# Patient Record
Sex: Female | Born: 1935
Health system: Southern US, Community
[De-identification: ages and names within clinical notes are randomized; demographics above are authoritative.]

## PROBLEM LIST (undated history)

## (undated) DIAGNOSIS — K219 Gastro-esophageal reflux disease without esophagitis: Secondary | ICD-10-CM

## (undated) DIAGNOSIS — T7840XA Allergy, unspecified, initial encounter: Secondary | ICD-10-CM

## (undated) DIAGNOSIS — G43909 Migraine, unspecified, not intractable, without status migrainosus: Secondary | ICD-10-CM

## (undated) DIAGNOSIS — F419 Anxiety disorder, unspecified: Secondary | ICD-10-CM

## (undated) DIAGNOSIS — C801 Malignant (primary) neoplasm, unspecified: Secondary | ICD-10-CM

## (undated) DIAGNOSIS — F329 Major depressive disorder, single episode, unspecified: Secondary | ICD-10-CM

## (undated) DIAGNOSIS — I10 Essential (primary) hypertension: Secondary | ICD-10-CM

## (undated) DIAGNOSIS — F32A Depression, unspecified: Secondary | ICD-10-CM

## (undated) DIAGNOSIS — G629 Polyneuropathy, unspecified: Secondary | ICD-10-CM

## (undated) DIAGNOSIS — M1990 Unspecified osteoarthritis, unspecified site: Secondary | ICD-10-CM

## (undated) HISTORY — DX: Major depressive disorder, single episode, unspecified: F32.9

## (undated) HISTORY — DX: Malignant (primary) neoplasm, unspecified: C80.1

## (undated) HISTORY — PX: BACK SURGERY: SHX140

## (undated) HISTORY — DX: Allergy, unspecified, initial encounter: T78.40XA

## (undated) HISTORY — PX: NECK SURGERY: SHX720

## (undated) HISTORY — DX: Unspecified osteoarthritis, unspecified site: M19.90

## (undated) HISTORY — DX: Migraine, unspecified, not intractable, without status migrainosus: G43.909

## (undated) HISTORY — PX: WRIST SURGERY: SHX841

## (undated) HISTORY — DX: Depression, unspecified: F32.A

## (undated) HISTORY — DX: Essential (primary) hypertension: I10

## (undated) HISTORY — PX: TONSILLECTOMY: SUR1361

## (undated) HISTORY — DX: Anxiety disorder, unspecified: F41.9

## (undated) HISTORY — DX: Gastro-esophageal reflux disease without esophagitis: K21.9

## (undated) HISTORY — PX: MOHS SURGERY: SHX181

---

## 2006-12-10 ENCOUNTER — Inpatient Hospital Stay: Payer: Self-pay | Admitting: *Deleted

## 2006-12-10 ENCOUNTER — Other Ambulatory Visit: Payer: Self-pay

## 2006-12-16 ENCOUNTER — Ambulatory Visit: Payer: Self-pay | Admitting: Unknown Physician Specialty

## 2007-01-09 ENCOUNTER — Ambulatory Visit: Payer: Self-pay | Admitting: Unknown Physician Specialty

## 2007-02-07 ENCOUNTER — Ambulatory Visit: Payer: Self-pay | Admitting: Unknown Physician Specialty

## 2007-03-10 ENCOUNTER — Ambulatory Visit: Payer: Self-pay | Admitting: Unknown Physician Specialty

## 2010-03-12 ENCOUNTER — Emergency Department: Payer: Self-pay | Admitting: Emergency Medicine

## 2012-06-02 ENCOUNTER — Ambulatory Visit: Payer: Self-pay

## 2016-03-05 DIAGNOSIS — G4700 Insomnia, unspecified: Secondary | ICD-10-CM | POA: Diagnosis not present

## 2016-03-05 DIAGNOSIS — R0982 Postnasal drip: Secondary | ICD-10-CM | POA: Diagnosis not present

## 2016-03-05 DIAGNOSIS — J069 Acute upper respiratory infection, unspecified: Secondary | ICD-10-CM | POA: Diagnosis not present

## 2016-03-05 DIAGNOSIS — I10 Essential (primary) hypertension: Secondary | ICD-10-CM | POA: Diagnosis not present

## 2016-06-06 DIAGNOSIS — G2581 Restless legs syndrome: Secondary | ICD-10-CM | POA: Diagnosis not present

## 2016-06-06 DIAGNOSIS — G4700 Insomnia, unspecified: Secondary | ICD-10-CM | POA: Diagnosis not present

## 2016-06-06 DIAGNOSIS — I10 Essential (primary) hypertension: Secondary | ICD-10-CM | POA: Diagnosis not present

## 2016-06-06 DIAGNOSIS — I83893 Varicose veins of bilateral lower extremities with other complications: Secondary | ICD-10-CM | POA: Diagnosis not present

## 2016-06-06 DIAGNOSIS — M5440 Lumbago with sciatica, unspecified side: Secondary | ICD-10-CM | POA: Diagnosis not present

## 2016-08-26 DIAGNOSIS — G2581 Restless legs syndrome: Secondary | ICD-10-CM | POA: Diagnosis not present

## 2016-08-26 DIAGNOSIS — I83893 Varicose veins of bilateral lower extremities with other complications: Secondary | ICD-10-CM | POA: Diagnosis not present

## 2016-08-30 ENCOUNTER — Ambulatory Visit (INDEPENDENT_AMBULATORY_CARE_PROVIDER_SITE_OTHER): Payer: PPO | Admitting: Podiatry

## 2016-08-30 ENCOUNTER — Ambulatory Visit: Payer: PPO | Admitting: Podiatry

## 2016-08-30 ENCOUNTER — Encounter: Payer: Self-pay | Admitting: Podiatry

## 2016-08-30 VITALS — BP 147/76 | HR 59 | Resp 16

## 2016-08-30 DIAGNOSIS — B351 Tinea unguium: Secondary | ICD-10-CM | POA: Diagnosis not present

## 2016-08-30 DIAGNOSIS — E0843 Diabetes mellitus due to underlying condition with diabetic autonomic (poly)neuropathy: Secondary | ICD-10-CM | POA: Diagnosis not present

## 2016-08-30 DIAGNOSIS — L603 Nail dystrophy: Secondary | ICD-10-CM

## 2016-08-30 DIAGNOSIS — L608 Other nail disorders: Secondary | ICD-10-CM

## 2016-08-30 DIAGNOSIS — M79676 Pain in unspecified toe(s): Secondary | ICD-10-CM | POA: Diagnosis not present

## 2016-08-30 DIAGNOSIS — L609 Nail disorder, unspecified: Secondary | ICD-10-CM

## 2016-08-30 DIAGNOSIS — M79609 Pain in unspecified limb: Principal | ICD-10-CM

## 2016-08-30 NOTE — Progress Notes (Signed)
   Subjective:    Patient ID: Sandra Davidson, female    DOB: [birth year], 80 y.o.   MRN: [id number]  HPI    Review of Systems  Musculoskeletal: Positive for back pain.  Neurological: Positive for headaches.  Hematological: Bruises/bleeds easily.       Objective:   Physical Exam        Assessment & Plan:

## 2016-09-01 NOTE — Progress Notes (Signed)
Patient ID: Sandra Davidson, female   DOB: Jun 12, 1936, 80 y.o.   MRN: [id number] SUBJECTIVE Patient with a history of diabetes mellitus presents to office today complaining of elongated, thickened nails. Pain while ambulating in shoes. Patient is unable to trim their own nails.   Allergies  Allergen Reactions  . Codeine   . Demerol [Meperidine]   . Penicillins   . Sulfa Antibiotics     OBJECTIVE General Patient is awake, alert, and oriented x 3 and in no acute distress. Derm Skin is dry and supple bilateral. Negative open lesions or macerations. Remaining integument unremarkable. Nails are tender, long, thickened and dystrophic with subungual debris, consistent with onychomycosis, 1-5 bilateral. No signs of infection noted. Vasc  DP and PT pedal pulses palpable bilaterally. Temperature gradient within normal limits.  Neuro Epicritic and protective threshold sensation diminished bilaterally.  Musculoskeletal Exam No symptomatic pedal deformities noted bilateral. Muscular strength within normal limits.  ASSESSMENT 1. Diabetes Mellitus w/ peripheral neuropathy 2. Onychomycosis of nail due to dermatophyte bilateral 3. Pain in foot bilateral  PLAN OF CARE Patient evaluated today. Instructed to maintain good pedal hygiene and foot care. Stressed importance of controlling blood sugar.  Mechanical debridement of nails 1-5 bilaterally performed using a nail nipper. Filed with dremel without incident.  All patient questions were answered. Return to clinic in 3 mos.    Edrick Kins, DPM

## 2016-09-01 NOTE — Patient Instructions (Signed)
Diabetes and Foot Care Diabetes may cause you to have problems because of poor blood supply (circulation) to your feet and legs. This may cause the skin on your feet to become thinner, break easier, and heal more slowly. Your skin may become dry, and the skin may peel and crack. You may also have nerve damage in your legs and feet causing decreased feeling in them. You may not notice minor injuries to your feet that could lead to infections or more serious problems. Taking care of your feet is one of the most important things you can do for yourself.  HOME CARE INSTRUCTIONS  Wear shoes at all times, even in the house. Do not go barefoot. Bare feet are easily injured.  Check your feet daily for blisters, cuts, and redness. If you cannot see the bottom of your feet, use a mirror or ask someone for help.  Wash your feet with warm water (do not use hot water) and mild soap. Then pat your feet and the areas between your toes until they are completely dry. Do not soak your feet as this can dry your skin.  Apply a moisturizing lotion or petroleum jelly (that does not contain alcohol and is unscented) to the skin on your feet and to dry, brittle toenails. Do not apply lotion between your toes.  Trim your toenails straight across. Do not dig under them or around the cuticle. File the edges of your nails with an emery board or nail file.  Do not cut corns or calluses or try to remove them with medicine.  Wear clean socks or stockings every day. Make sure they are not too tight. Do not wear knee-high stockings since they may decrease blood flow to your legs.  Wear shoes that fit properly and have enough cushioning. To break in new shoes, wear them for just a few hours a day. This prevents you from injuring your feet. Always look in your shoes before you put them on to be sure there are no objects inside.  Do not cross your legs. This may decrease the blood flow to your feet.  If you find a minor scrape,  cut, or break in the skin on your feet, keep it and the skin around it clean and dry. These areas may be cleansed with mild soap and water. Do not cleanse the area with peroxide, alcohol, or iodine.  When you remove an adhesive bandage, be sure not to damage the skin around it.  If you have a wound, look at it several times a day to make sure it is healing.  Do not use heating pads or hot water bottles. They may burn your skin. If you have lost feeling in your feet or legs, you may not know it is happening until it is too late.  Make sure your health care provider performs a complete foot exam at least annually or more often if you have foot problems. Report any cuts, sores, or bruises to your health care provider immediately. SEEK MEDICAL CARE IF:   You have an injury that is not healing.  You have cuts or breaks in the skin.  You have an ingrown nail.  You notice redness on your legs or feet.  You feel burning or tingling in your legs or feet.  You have pain or cramps in your legs and feet.  Your legs or feet are numb.  Your feet always feel cold. SEEK IMMEDIATE MEDICAL CARE IF:   There is increasing redness,   swelling, or pain in or around a wound.  There is a red line that goes up your leg.  Pus is coming from a wound.  You develop a fever or as directed by your health care provider.  You notice a bad smell coming from an ulcer or wound.   This information is not intended to replace advice given to you by your health care provider. Make sure you discuss any questions you have with your health care provider.   Document Released: 11/22/2000 Document Revised: 07/28/2013 Document Reviewed: 05/04/2013 Elsevier Interactive Patient Education 2016 [employer]  

## 2016-09-04 DIAGNOSIS — I83893 Varicose veins of bilateral lower extremities with other complications: Secondary | ICD-10-CM | POA: Diagnosis not present

## 2016-09-09 ENCOUNTER — Encounter (INDEPENDENT_AMBULATORY_CARE_PROVIDER_SITE_OTHER): Payer: PPO | Admitting: Vascular Surgery

## 2016-09-26 ENCOUNTER — Encounter (INDEPENDENT_AMBULATORY_CARE_PROVIDER_SITE_OTHER): Payer: Self-pay | Admitting: Vascular Surgery

## 2016-09-26 ENCOUNTER — Ambulatory Visit (INDEPENDENT_AMBULATORY_CARE_PROVIDER_SITE_OTHER): Payer: PPO | Admitting: Vascular Surgery

## 2016-09-26 DIAGNOSIS — G629 Polyneuropathy, unspecified: Secondary | ICD-10-CM | POA: Diagnosis not present

## 2016-09-26 DIAGNOSIS — M5442 Lumbago with sciatica, left side: Secondary | ICD-10-CM | POA: Diagnosis not present

## 2016-09-26 DIAGNOSIS — I872 Venous insufficiency (chronic) (peripheral): Secondary | ICD-10-CM | POA: Diagnosis not present

## 2016-09-26 DIAGNOSIS — I890 Lymphedema, not elsewhere classified: Secondary | ICD-10-CM

## 2016-09-26 DIAGNOSIS — M545 Low back pain, unspecified: Secondary | ICD-10-CM | POA: Insufficient documentation

## 2016-09-26 DIAGNOSIS — G8929 Other chronic pain: Secondary | ICD-10-CM

## 2016-09-26 DIAGNOSIS — M5441 Lumbago with sciatica, right side: Secondary | ICD-10-CM

## 2016-09-28 NOTE — Progress Notes (Signed)
MRN : [id number]  Sandra Davidson is a 80 y.o. (09-30-1936) female who presents with chief complaint of  Chief Complaint  Patient presents with  . New Patient (Initial Visit)  .  History of Present Illness: Patient is seen for evaluation of leg pain and leg swelling. The patient first noticed the swelling remotely. The swelling is associated with pain and discoloration. The pain and swelling worsens with prolonged dependency and improves with elevation. The pain is unrelated to activity.  The patient notes that in the morning the legs are significantly improved but they steadily worsened throughout the course of the day. The patient also notes a steady worsening of the discoloration in the ankle and shin area.   The patient denies claudication symptoms.  The patient denies symptoms consistent with rest pain.  The patient denies and extensive history of DJD and LS spine disease.  The patient has no had any past angiography, interventions or vascular surgery.  Elevation makes the leg symptoms better, dependency makes them much worse. There is no history of ulcerations. The patient denies any recent changes in medications.  The patient has not been wearing graduated compression.  The patient denies a history of DVT or PE. There is no prior history of phlebitis. There is no history of primary lymphedema.  No history of malignancies. No history of trauma or groin or pelvic surgery. There is no history of radiation treatment to the groin or pelvis  The patient denies amaurosis fugax or recent TIA symptoms. There are no recent neurological changes noted. The patient denies recent episodes of angina or shortness of breath.   Current Outpatient Prescriptions  Medication Sig Dispense Refill  . ALPRAZolam (XANAX) 0.5 MG tablet Take 0.5 mg by mouth at bedtime as needed for anxiety.    Marland Kitchen amLODipine-benazepril (LOTREL) 10-20 MG capsule Take 1 capsule by mouth daily.    . furosemide (LASIX) 20 MG  tablet Take 20 mg by mouth.    . gabapentin (NEURONTIN) 100 MG capsule Take 100 mg by mouth at bedtime.    Marland Kitchen ibuprofen (ADVIL,MOTRIN) 600 MG tablet Take 600 mg by mouth every 6 (six) hours as needed.    . potassium chloride (K-DUR,KLOR-CON) 10 MEQ tablet Take 10 mEq by mouth once.     No current facility-administered medications for this visit.     Past Medical History:  Diagnosis Date  . Hypertension     Past Surgical History:  Procedure Laterality Date  . BACK SURGERY    . MOHS SURGERY    . NECK SURGERY      Social History Social History  Substance Use Topics  . Smoking status: Former Research scientist (life sciences)  . Smokeless tobacco: Never Used  . Alcohol use No  No family history of bleeding/clotting disorders, porphyria or autoimmune disease   Family History Family History  Problem Relation Age of Onset  . Congestive Heart Failure Mother   . COPD Mother   . Emphysema Mother   . Hypertension Mother   . Kidney disease Mother   . Cancer Father   . Kidney disease Father     Allergies  Allergen Reactions  . Codeine   . Demerol [Meperidine]   . Penicillins   . Sulfa Antibiotics      REVIEW OF SYSTEMS (Negative unless checked)  Constitutional: [] Weight loss  [] Fever  [] Chills Cardiac: [] Chest pain   [] Chest pressure   [] Palpitations   [] Shortness of breath at rest   [] Shortness of breath with exertion. Vascular:  []   Pain in legs with walking   [] Pain in legs at rest    [] Pain in feet at rest    [] History of DVT   [] Phlebitis   [x] Swelling in legs   [] Varicose veins   [] Non-healing ulcers Pulmonary:   [] Uses home oxygen   [] Productive cough   [] Hemoptysis   [] Wheeze  [] COPD   [] Asthma Neurologic:  [] Dizziness  [] Blackouts   [] Seizures   [] History of stroke   [] History of TIA  [] Aphasia   [] Temporary blindness   [] Dysphagia   [] Weakness or numbness in arm   [] Weakness or numbness in leg Musculoskeletal:  [x] Arthritis   [x] Joint swelling   [x] Joint pain   [] Low back pain Hematologic:   [] Easy bruising  [] Easy bleeding   [] Hypercoagulable state   [] Anemic   Gastrointestinal:  [] Blood in stool   [] Vomiting blood  [] Gastroesophageal reflux/heartburn   [] Difficulty swallowing. Genitourinary:  [] Chronic kidney disease   [] Difficult urination  [] Frequent urination  [] Burning with urination   [] Blood in urine Skin:  [] Rashes   [] Ulcers   Psychological:  [] History of anxiety   []  History of major depression.    Physical Examination  Vitals:   09/26/16 1459  BP: 120/68  Pulse: 74  Resp: 16  Weight: 145 lb (65.8 kg)  Height: 5\' 7"  (1.702 m)   Body mass index is 22.71 kg/m. Gen:  WD/WN, NAD Head: [place]/AT, No temporalis wasting. Ear/Nose/Throat: Hearing grossly intact, nares w/o erythema or drainage, trachea midline Eyes: PERR, EOM appear normal. Sclera non-icteric Neck: Supple, no nuchal rigidity.  No bruit or JVD.  Pulmonary:  Good air movement, equal and clear to auscultation bilaterally.  Cardiac: RRR, normal S1, S2, no Murmurs, rubs or gallops. Vascular:   Moderate venous changes bilateral ankles  2-3+ pitting edema Rt=Lt Vessel Right Left  Radial Palpable Palpable  Ulnar Palpable Palpable  Brachial Palpable Palpable  Carotid Palpable Palpable  Aorta Not palpable N/A  Femoral Palpable Palpable  Popliteal Palpable Palpable  PT Palpable Palpable  DP Palpable Palpable   Gastrointestinal: soft, non-rigid/non-distended. No guarding.  Musculoskeletal: M/S 5/5 throughout.  No deformity or atrophy. Neurologic: CN 2-12 intact. Pain and light touch intact in extremities.  Speech is fluent. Motor exam as listed above. Psychiatric: Judgment intact, Mood & affect appropriate for pt's clinical situation. Dermatologic: No rashes or ulcers noted.  No signs consistent with cellulitis. Lymphatic:  No cervical lymphadenopathy  CBC No results found for: WBC, HGB, HCT, MCV, PLT  BMET No results found for: NA, K, CL, CO2, GLUCOSE, BUN, CREATININE, CALCIUM, GFRNONAA, GFRAA CrCl  cannot be calculated (No order found.).  COAG No results found for: INR, PROTIME  Radiology No results found.    Assessment/Plan 1. Chronic venous insufficiency I have had a long discussion with the patient regarding swelling and why it  causes symptoms.  Patient will begin wearing graduated compression stockings class 1 (20-30 mmHg) on a daily basis a prescription was given. The patient will  beginning wearing the stockings first thing in the morning and removing them in the evening. The patient is instructed specifically not to sleep in the stockings.   In addition, behavioral modification will be initiated.  This will include frequent elevation, use of over the counter pain medications and exercise such as walking.  I have reviewed systemic causes for chronic edema such as liver, kidney and cardiac etiologies.  The patient denies problems with these organ systems.    Consideration for a lymph pump will also be made based  upon the effectiveness of conservative therapy.  This would help to improve the edema control and prevent sequela such as ulcers and infections   Patient's duplex ultrasound dated 09/04/2016 did not show any DVT and  reflux is not present.  The patient will follow-up with to get a lymph pump  2. Lymphedema Will move forward with a lymph pump  3. Chronic bilateral low back pain with bilateral sciatica Continue antiinflammatory medications   4. Neuropathy ([place]) Continue  gabapentin    Hortencia Pilar, MD  09/28/2016 1:19 PM    This note was created with Dragon medical transcription system.  Any errors from dictation are purely unintentional

## 2016-10-03 DIAGNOSIS — G2581 Restless legs syndrome: Secondary | ICD-10-CM | POA: Diagnosis not present

## 2016-10-03 DIAGNOSIS — N390 Urinary tract infection, site not specified: Secondary | ICD-10-CM | POA: Diagnosis not present

## 2016-10-03 DIAGNOSIS — I10 Essential (primary) hypertension: Secondary | ICD-10-CM | POA: Diagnosis not present

## 2016-10-03 DIAGNOSIS — G4700 Insomnia, unspecified: Secondary | ICD-10-CM | POA: Diagnosis not present

## 2016-10-03 DIAGNOSIS — E559 Vitamin D deficiency, unspecified: Secondary | ICD-10-CM | POA: Diagnosis not present

## 2016-10-03 DIAGNOSIS — Z23 Encounter for immunization: Secondary | ICD-10-CM | POA: Diagnosis not present

## 2016-10-03 DIAGNOSIS — I83893 Varicose veins of bilateral lower extremities with other complications: Secondary | ICD-10-CM | POA: Diagnosis not present

## 2016-10-03 DIAGNOSIS — Z0001 Encounter for general adult medical examination with abnormal findings: Secondary | ICD-10-CM | POA: Diagnosis not present

## 2016-10-16 ENCOUNTER — Telehealth (INDEPENDENT_AMBULATORY_CARE_PROVIDER_SITE_OTHER): Payer: Self-pay

## 2016-10-16 NOTE — Telephone Encounter (Signed)
Patient called stating she was in a lot of pain from her back, she know that Dr. Delana Meyer see her for her vascular issues but was wondering, since she had a prescription from a former doctor for Lodine could he call it in. She has taken the last pills wondered would Dr. Delana Meyer write her a prescription for more. She has called the pharmacy and they sent the new prescription over to [facility] but he suggested she call them, the prescription was written by a Dr. Hardin Negus who is deceased now so she called them but is unable to get through. So she decided to call Dr. Delana Meyer so she could just have it called in and pick it up. I explained to the patient that we have not seen her for problems with her back and did not write that prescription so she would have to go through her PCP.

## 2016-11-25 ENCOUNTER — Ambulatory Visit (INDEPENDENT_AMBULATORY_CARE_PROVIDER_SITE_OTHER): Payer: PPO | Admitting: Vascular Surgery

## 2016-11-25 ENCOUNTER — Encounter (INDEPENDENT_AMBULATORY_CARE_PROVIDER_SITE_OTHER): Payer: Self-pay | Admitting: Vascular Surgery

## 2016-11-25 VITALS — BP 135/70 | HR 62 | Resp 16 | Ht 67.0 in | Wt 143.0 lb

## 2016-11-25 DIAGNOSIS — G629 Polyneuropathy, unspecified: Secondary | ICD-10-CM | POA: Diagnosis not present

## 2016-11-25 DIAGNOSIS — I890 Lymphedema, not elsewhere classified: Secondary | ICD-10-CM

## 2016-11-25 DIAGNOSIS — I872 Venous insufficiency (chronic) (peripheral): Secondary | ICD-10-CM

## 2016-11-25 NOTE — Progress Notes (Signed)
MRN : [id number]  Sandra Davidson is a 80 y.o. (1936/06/18) female who presents with chief complaint of  Chief Complaint  Patient presents with  . Re-evaluation    2 month follow up  .  History of Present Illness:The patient returns to the office for followup evaluation regarding leg swelling.  The swelling has persisted and the pain associated with swelling continues. There have not been any interval development of a ulcerations or wounds.  Since the previous visit the patient has been wearing graduated compression stockings and has noted little if any improvement in the lymphedema. The patient has been using compression routinely morning until night.  The patient also states elevation during the day and exercise is being done too.   Current Meds  Medication Sig  . ALPRAZolam (XANAX) 0.5 MG tablet Take 0.5 mg by mouth at bedtime as needed for anxiety.  Marland Kitchen amLODipine-benazepril (LOTREL) 10-20 MG capsule Take 1 capsule by mouth daily.  Marland Kitchen etodolac (LODINE) 300 MG capsule Take 300 mg by mouth every 8 (eight) hours.  . furosemide (LASIX) 20 MG tablet Take 20 mg by mouth.  Marland Kitchen ibuprofen (ADVIL,MOTRIN) 600 MG tablet Take 600 mg by mouth every 6 (six) hours as needed.  . potassium chloride (K-DUR,KLOR-CON) 10 MEQ tablet Take 10 mEq by mouth once.    Past Medical History:  Diagnosis Date  . Hypertension     Past Surgical History:  Procedure Laterality Date  . BACK SURGERY    . MOHS SURGERY    . NECK SURGERY      Social History Social History  Substance Use Topics  . Smoking status: Former Research scientist (life sciences)  . Smokeless tobacco: Never Used  . Alcohol use No    Family History Family History  Problem Relation Age of Onset  . Congestive Heart Failure Mother   . COPD Mother   . Emphysema Mother   . Hypertension Mother   . Kidney disease Mother   . Cancer Father   . Kidney disease Father   No family history of bleeding/clotting disorders, porphyria or autoimmune disease   Allergies    Allergen Reactions  . Codeine   . Demerol [Meperidine]   . Penicillins   . Sulfa Antibiotics      REVIEW OF SYSTEMS (Negative unless checked)  Constitutional: [] Weight loss  [] Fever  [] Chills Cardiac: [] Chest pain   [] Chest pressure   [] Palpitations   [] Shortness of breath when laying flat   [] Shortness of breath with exertion. Vascular:  [] Pain in legs with walking   [x] Pain in legs at rest  [] History of DVT   [] Phlebitis   [x] Swelling in legs   [] Varicose veins   [] Non-healing ulcers Pulmonary:   [] Uses home oxygen   [] Productive cough   [] Hemoptysis   [] Wheeze  [] COPD   [] Asthma Neurologic:  [] Dizziness   [] Seizures   [] History of stroke   [] History of TIA  [] Aphasia   [] Vissual changes   [] Weakness or numbness in arm   [] Weakness or numbness in leg Musculoskeletal:   [] Joint swelling   [] Joint pain   [] Low back pain Hematologic:  [] Easy bruising  [] Easy bleeding   [] Hypercoagulable state   [] Anemic Gastrointestinal:  [] Diarrhea   [] Vomiting  [] Gastroesophageal reflux/heartburn   [] Difficulty swallowing. Genitourinary:  [] Chronic kidney disease   [] Difficult urination  [] Frequent urination   [] Blood in urine Skin:  [] Rashes   [] Ulcers  Psychological:  [] History of anxiety   []  History of major depression.  Physical Examination  Vitals:  11/25/16 1306  BP: 135/70  Pulse: 62  Resp: 16  Weight: 143 lb (64.9 kg)  Height: 5\' 7"  (1.702 m)   Body mass index is 22.4 kg/m. Gen: WD/WN, NAD Head: [place]/AT, No temporalis wasting.  Ear/Nose/Throat: Hearing grossly intact, nares w/o erythema or drainage, poor dentition Eyes: PER, EOMI, sclera nonicteric.  Neck: Supple, no masses.  No bruit or JVD.  Pulmonary:  Good air movement, clear to auscultation bilaterally, no use of accessory muscles.  Cardiac: RRR, normal S1, S2, no Murmurs. Vascular: right leg swelling 3+ left leg swelling 2+ moderate venous stasis changes right > left Vessel Right Left  Radial Palpable Palpable  Ulnar  Palpable Palpable  Brachial Palpable Palpable  Carotid Palpable Palpable  Femoral Palpable Palpable  Popliteal Palpable Palpable  PT Palpable Palpable  DP Palpable Palpable   Gastrointestinal: soft, non-distended. No guarding/no peritoneal signs.  Musculoskeletal: M/S 5/5 throughout.  No deformity or atrophy.  Neurologic: CN 2-12 intact. Pain and light touch intact in extremities.  Symmetrical.  Speech is fluent. Motor exam as listed above. Psychiatric: Judgment intact, Mood & affect appropriate for pt's clinical situation. Dermatologic: No rashes or ulcers noted.  No changes consistent with cellulitis. Lymph : No Cervical lymphadenopathy, no lichenification or skin changes of chronic lymphedema.  CBC No results found for: WBC, HGB, HCT, MCV, PLT  BMET No results found for: NA, K, CL, CO2, GLUCOSE, BUN, CREATININE, CALCIUM, GFRNONAA, GFRAA CrCl cannot be calculated (No order found.).  COAG No results found for: INR, PROTIME  Radiology No results found.  Assessment/Plan 1. Chronic venous insufficiency Recommend:  No surgery or intervention at this point in time.    I have reviewed my previous discussion with the patient regarding swelling and why it causes symptoms.  Patient will continue wearing graduated compression stockings class 1 (20-30 mmHg) on a daily basis. The patient will  beginning wearing the stockings first thing in the morning and removing them in the evening. The patient is instructed specifically not to sleep in the stockings.    In addition, behavioral modification including several periods of elevation of the lower extremities during the day will be continued.  This was reviewed with the patient during the initial visit.  The patient will also continue routine exercise, especially walking on a daily basis as was discussed during the initial visit.    Despite conservative treatments including graduated compression therapy class 1 and behavioral modification  including exercise and elevation the patient  has not obtained adequate control of the lymphedema.  The patient still has stage 3 lymphedema and therefore, I believe that a lymph pump should be added to improve the control of the patient's lymphedema.  Additionally, a lymph pump is warranted because it will reduce the risk of cellulitis and ulceration in the future.  Patient should follow-up in six months    2. Lymphedema See #1  3. Neuropathy ([place]) She has stopped the gabapentin  Because it wasn't helping  She has started back on Lodine which is making her feel better  Hortencia Pilar, MD  11/25/2016 1:11 PM

## 2017-01-14 DIAGNOSIS — I890 Lymphedema, not elsewhere classified: Secondary | ICD-10-CM | POA: Diagnosis not present

## 2017-02-13 ENCOUNTER — Ambulatory Visit (INDEPENDENT_AMBULATORY_CARE_PROVIDER_SITE_OTHER): Payer: PPO | Admitting: Family Medicine

## 2017-02-13 ENCOUNTER — Encounter: Payer: Self-pay | Admitting: Family Medicine

## 2017-02-13 DIAGNOSIS — M1990 Unspecified osteoarthritis, unspecified site: Secondary | ICD-10-CM | POA: Insufficient documentation

## 2017-02-13 DIAGNOSIS — C4490 Unspecified malignant neoplasm of skin, unspecified: Secondary | ICD-10-CM | POA: Diagnosis not present

## 2017-02-13 DIAGNOSIS — M170 Bilateral primary osteoarthritis of knee: Secondary | ICD-10-CM

## 2017-02-13 DIAGNOSIS — I10 Essential (primary) hypertension: Secondary | ICD-10-CM | POA: Diagnosis not present

## 2017-02-13 DIAGNOSIS — I872 Venous insufficiency (chronic) (peripheral): Secondary | ICD-10-CM

## 2017-02-13 DIAGNOSIS — F419 Anxiety disorder, unspecified: Secondary | ICD-10-CM | POA: Diagnosis not present

## 2017-02-13 DIAGNOSIS — F329 Major depressive disorder, single episode, unspecified: Secondary | ICD-10-CM | POA: Insufficient documentation

## 2017-02-13 DIAGNOSIS — F32A Depression, unspecified: Secondary | ICD-10-CM | POA: Insufficient documentation

## 2017-02-13 NOTE — Progress Notes (Signed)
Pre visit review using our clinic review tool, if applicable. No additional management support is needed unless otherwise documented below in the visit note. 

## 2017-02-13 NOTE — Assessment & Plan Note (Signed)
Currently stable. She'll continue to monitor this.

## 2017-02-13 NOTE — Progress Notes (Signed)
Tommi Rumps, MD Phone: [phone]  Sandra Davidson is a 81 y.o. female who presents today for new patient visit.  Hypertension: She does not check at home. She takes amlodipine and benazepril. Reports she had lab work about 3 months ago at her prior physician's office. No chest pain or shortness of breath. She does have chronic venous insufficiency which she follows with vascular surgery for. She is currently using pumps for this. Also takes Lasix 3-4 days a week. She takes potassium with this. This has not worsened.  She follows with dermatology for skin cancer regularly.  Anxiety: Take Xanax nightly. She can tell a difference if she misses a dose. Some depression. Notes this is all helped out by her dog who serves as a calming influence. She has no SI or HI. She notes no drowsiness or falls.  She reports chronic issues with neuropathy and knee pain. Has had this for at least 4 years. Following with vascular surgery for her neuropathy.  Active Ambulatory Problems    Diagnosis Date Noted  . Chronic venous insufficiency 09/26/2016  . Lymphedema 09/26/2016  . Lumbar back pain 09/26/2016  . Neuropathy ([place]) 09/26/2016  . Hypertension 02/13/2017  . Skin cancer 02/13/2017  . Anxiety 02/13/2017  . Osteoarthritis 02/13/2017   Resolved Ambulatory Problems    Diagnosis Date Noted  . No Resolved Ambulatory Problems   Past Medical History:  Diagnosis Date  . Allergy   . Anxiety   . Arthritis   . Cancer ([place])   . Depression   . GERD (gastroesophageal reflux disease)   . Hypertension   . Migraines     Family History  Problem Relation Age of Onset  . Congestive Heart Failure Mother   . COPD Mother   . Emphysema Mother   . Hypertension Mother   . Kidney disease Mother   . Cancer Father   . Kidney disease Father     Social History   Social History  . Marital status: Divorced    Spouse name: N/A  . Number of children: N/A  . Years of education: N/A   Occupational  History  . Not on file.   Social History Main Topics  . Smoking status: Former Research scientist (life sciences)  . Smokeless tobacco: Never Used  . Alcohol use No  . Drug use: No  . Sexual activity: Not on file   Other Topics Concern  . Not on file   Social History Narrative  . No narrative on file    ROS  General:  Negative for nexplained weight loss, fever Skin: Negative for new or changing mole, sore that won't heal HEENT: Positive for trouble seeing (she wears glasses), Negative for trouble hearing, ringing in ears, mouth sores, hoarseness, change in voice, dysphagia. CV:  Positive for edema, Negative for chest pain, dyspnea, palpitations Resp: Negative for cough, dyspnea, hemoptysis GI: Negative for nausea, vomiting, diarrhea, constipation, abdominal pain, melena, hematochezia. GU: Negative for dysuria, incontinence, urinary hesitance, hematuria, vaginal or penile discharge, polyuria, sexual difficulty, lumps in testicle or breasts MSK: Positive for muscle cramps or aches, joint pain or swelling Neuro: Negative for headaches, weakness, numbness, dizziness, passing out/fainting Psych: Positive for anxiety, Negative for depression, memory problems  Objective  Physical Exam Vitals:   02/13/17 0909  BP: 100/68  Pulse: 68  Temp: 97.9 F (36.6 C)    BP Readings from Last 3 Encounters:  02/13/17 100/68  11/25/16 135/70  09/26/16 120/68   Wt Readings from Last 3 Encounters:  02/13/17 144 lb (  65.3 kg)  11/25/16 143 lb (64.9 kg)  09/26/16 145 lb (65.8 kg)    Physical Exam  Constitutional: No distress.  HENT:  Head: Normocephalic and atraumatic.  Mouth/Throat: Oropharynx is clear and moist. No oropharyngeal exudate.  Eyes: Conjunctivae are normal. Pupils are equal, round, and reactive to light.  Cardiovascular: Normal rate, regular rhythm and normal heart sounds.   Pulmonary/Chest: Effort normal and breath sounds normal.  Abdominal: Soft. Bowel sounds are normal. She exhibits no  distension. There is no tenderness. There is no rebound and no guarding.  Musculoskeletal: She exhibits no edema (compression socks place).  Neurological: She is alert. Gait normal.  Skin: Skin is warm and dry. She is not diaphoretic.  Psychiatric: Mood and affect normal.     Assessment/Plan:   Chronic venous insufficiency Stable. Followed by vascular surgery. She'll continue to follow with them.  Hypertension Well-controlled. Continue current medications. We'll request records of her most recent lab work. Follow-up in 3 months.  Skin cancer She'll continue to follow with dermatology.  Anxiety Stable on Xanax. No drowsiness or falls. A letter was provided regarding her pet who serves as a calming and soothing influence for her anxiety and depression for her housing.  Osteoarthritis Currently stable. She'll continue to monitor this.   No orders of the defined types were placed in this encounter.   No orders of the defined types were placed in this encounter.    Tommi Rumps, MD [place]

## 2017-02-13 NOTE — Assessment & Plan Note (Signed)
She'll continue to follow with dermatology.

## 2017-02-13 NOTE — Assessment & Plan Note (Signed)
Stable on Xanax. No drowsiness or falls. A letter was provided regarding her pet who serves as a calming and soothing influence for her anxiety and depression for her housing.

## 2017-02-13 NOTE — Assessment & Plan Note (Signed)
Well-controlled. Continue current medications. We'll request records of her most recent lab work. Follow-up in 3 months.

## 2017-02-13 NOTE — Patient Instructions (Signed)
Nice to meet you. We will request your records from your prior physician. We'll see you back in 3 months to recheck your blood pressure. Please continue to follow with vascular surgery for your venous insufficiency.

## 2017-02-13 NOTE — Assessment & Plan Note (Signed)
Stable. Followed by vascular surgery. She'll continue to follow with them.

## 2017-04-14 DIAGNOSIS — C44119 Basal cell carcinoma of skin of left eyelid, including canthus: Secondary | ICD-10-CM | POA: Diagnosis not present

## 2017-04-14 DIAGNOSIS — L821 Other seborrheic keratosis: Secondary | ICD-10-CM | POA: Diagnosis not present

## 2017-04-14 DIAGNOSIS — D1801 Hemangioma of skin and subcutaneous tissue: Secondary | ICD-10-CM | POA: Diagnosis not present

## 2017-04-14 DIAGNOSIS — C44622 Squamous cell carcinoma of skin of right upper limb, including shoulder: Secondary | ICD-10-CM | POA: Diagnosis not present

## 2017-04-14 DIAGNOSIS — L814 Other melanin hyperpigmentation: Secondary | ICD-10-CM | POA: Diagnosis not present

## 2017-04-14 DIAGNOSIS — D485 Neoplasm of uncertain behavior of skin: Secondary | ICD-10-CM | POA: Diagnosis not present

## 2017-04-22 DIAGNOSIS — D0461 Carcinoma in situ of skin of right upper limb, including shoulder: Secondary | ICD-10-CM | POA: Diagnosis not present

## 2017-04-23 ENCOUNTER — Other Ambulatory Visit: Payer: Self-pay

## 2017-04-23 DIAGNOSIS — I10 Essential (primary) hypertension: Secondary | ICD-10-CM

## 2017-04-23 MED ORDER — ALPRAZOLAM 0.5 MG PO TABS: 0.5000 mg | ORAL_TABLET | Freq: Every evening | ORAL | 1 refills | Status: DC | PRN

## 2017-04-23 NOTE — Telephone Encounter (Signed)
Script faxed to [facility].

## 2017-04-23 NOTE — Telephone Encounter (Signed)
Requesting refill on ibuprofen and alprazolam both filed under historical

## 2017-04-23 NOTE — Telephone Encounter (Signed)
Last OV 02/13/17 filed under historical

## 2017-04-23 NOTE — Telephone Encounter (Signed)
Please fax xanax. Needs renal function labs prior to refilling ibuprofen. Get her set up for a lab appointment. Order placed. Thanks.

## 2017-04-25 ENCOUNTER — Other Ambulatory Visit: Payer: Self-pay | Admitting: *Deleted

## 2017-04-25 NOTE — Telephone Encounter (Signed)
Patient has requested a refill for Ibuprofen  Pharmacy [facility] [place] contact [phone]

## 2017-04-25 NOTE — Telephone Encounter (Signed)
Last OV 02/13/17 filed under historical

## 2017-04-25 NOTE — Telephone Encounter (Signed)
Patient needs a lab appointment prior to filling this. Thanks. Orders have been placed.

## 2017-04-28 ENCOUNTER — Other Ambulatory Visit: Payer: Self-pay | Admitting: Family Medicine

## 2017-04-28 ENCOUNTER — Other Ambulatory Visit (INDEPENDENT_AMBULATORY_CARE_PROVIDER_SITE_OTHER): Payer: PPO

## 2017-04-28 DIAGNOSIS — I10 Essential (primary) hypertension: Secondary | ICD-10-CM | POA: Diagnosis not present

## 2017-04-28 LAB — BASIC METABOLIC PANEL
BUN: 13 mg/dL (ref 6–23)
CHLORIDE: 106 meq/L (ref 96–112)
CO2: 24 meq/L (ref 19–32)
Calcium: 9.5 mg/dL (ref 8.4–10.5)
Creatinine, Ser: 0.87 mg/dL (ref 0.40–1.20)
GFR: 66.8 mL/min (ref >60.00)
Glucose, Bld: 121 mg/dL — ABNORMAL HIGH (ref 70–99)
Potassium: 3.5 mEq/L (ref 3.5–5.1)
Sodium: 141 mEq/L (ref 135–145)

## 2017-04-28 NOTE — Telephone Encounter (Signed)
Advil 600 mg rx request Last OV 02/13/2017 Next OV 05/16/2017 Please advise

## 2017-04-29 ENCOUNTER — Other Ambulatory Visit: Payer: Self-pay | Admitting: Family Medicine

## 2017-04-29 DIAGNOSIS — R7309 Other abnormal glucose: Secondary | ICD-10-CM

## 2017-04-30 NOTE — Telephone Encounter (Signed)
Patient has appmt on 05/16/17

## 2017-04-30 NOTE — Telephone Encounter (Signed)
Patient had labs drawn already. Order has already been sent to pharmacy.

## 2017-05-01 ENCOUNTER — Other Ambulatory Visit (INDEPENDENT_AMBULATORY_CARE_PROVIDER_SITE_OTHER): Payer: PPO

## 2017-05-01 DIAGNOSIS — R7309 Other abnormal glucose: Secondary | ICD-10-CM | POA: Diagnosis not present

## 2017-05-01 LAB — HEMOGLOBIN A1C: HEMOGLOBIN A1C: 6.2 % (ref 4.6–6.5)

## 2017-05-02 ENCOUNTER — Telehealth: Payer: Self-pay | Admitting: Family Medicine

## 2017-05-02 NOTE — Telephone Encounter (Signed)
Patient notified of lab results

## 2017-05-02 NOTE — Telephone Encounter (Signed)
Pt called and is requesting lab results. Please advise, thank you!  Call pt @ [phone]

## 2017-05-16 ENCOUNTER — Encounter: Payer: Self-pay | Admitting: Family Medicine

## 2017-05-16 ENCOUNTER — Ambulatory Visit (INDEPENDENT_AMBULATORY_CARE_PROVIDER_SITE_OTHER): Payer: PPO | Admitting: Family Medicine

## 2017-05-16 DIAGNOSIS — I872 Venous insufficiency (chronic) (peripheral): Secondary | ICD-10-CM | POA: Diagnosis not present

## 2017-05-16 DIAGNOSIS — G629 Polyneuropathy, unspecified: Secondary | ICD-10-CM

## 2017-05-16 DIAGNOSIS — B351 Tinea unguium: Secondary | ICD-10-CM | POA: Diagnosis not present

## 2017-05-16 DIAGNOSIS — I10 Essential (primary) hypertension: Secondary | ICD-10-CM | POA: Diagnosis not present

## 2017-05-16 DIAGNOSIS — R7303 Prediabetes: Secondary | ICD-10-CM | POA: Diagnosis not present

## 2017-05-16 NOTE — Assessment & Plan Note (Signed)
Reports symptoms have improved with Lodine. She'll continue to monitor.

## 2017-05-16 NOTE — Assessment & Plan Note (Signed)
A1c is 6.2. Discussed diet and exercise.

## 2017-05-16 NOTE — Patient Instructions (Signed)
Nice to see you. Please work on [employer] as discussed. There are instructions listed below. Please monitor urine toe that was bruised. If this does not improve please let us know.  Diet Recommendations  Starchy (carb) foods: Bread, rice, pasta, potatoes, corn, cereal, grits, crackers, bagels, muffins, all baked goods.  (Fruits, milk, and yogurt also have carbohydrate, but most of these foods will not spike your blood sugar as the starchy foods will.)  A few fruits do cause high blood sugars; use small portions of bananas (limit to 1/2 at a time), grapes, watermelon, oranges, and most tropical fruits.    Protein foods: Meat, fish, poultry, eggs, dairy foods, and beans such as pinto and kidney beans (beans also provide carbohydrate).   1. Eat at least 3 meals and 1-2 snacks per day. Never go more than 4-5 hours while awake without eating. Eat breakfast within the first hour of getting up.   2. Limit starchy foods to TWO per meal and ONE per snack. ONE portion of a starchy  food is equal to the following:   - ONE slice of bread (or its equivalent, such as half of a hamburger bun).   - 1/2 cup of a "scoopable" starchy food such as potatoes or rice.   - 15 grams of carbohydrate as shown on food label.  3. Include at every meal: a protein food, a carb food, and vegetables and/or fruit.   - Obtain twice the volume of veg's as protein or carbohydrate foods for both lunch and dinner.   - Fresh or frozen veg's are best.   - Keep frozen veg's on hand for a quick vegetable serving.

## 2017-05-16 NOTE — Assessment & Plan Note (Signed)
No edema today. Continue to monitor.

## 2017-05-16 NOTE — Progress Notes (Signed)
  Tommi Rumps, MD Phone: [phone]  Sandra Davidson is a 81 y.o. female who presents today for follow-up.  Hypertension: Not checking at home. She is taking amlodipine, benazepril, Lasix. No chest pain or shortness of breath. Notes her edema is quite a bit improved.  Patient does have venous insufficiency and lymphedema. Followed by vascular surgery. Currently using pumps for this. Has improved.  Neuropathy: Patient does note that her feet feel numb though only typically bothers her at night when she places the sheets on them. No pain. She's been intermittently taking Lodine and this has been helpful.  Recently diagnosed with prediabetes. No polydipsia or polyphagia. Does urinate frequently though is on Lasix. She is switched to artificial sweeteners at times. She wants to know what to do with her diet.  Patient reports she bruised her right second toenail after anticipating in part of a 5K. Does not hurt her. Does have onychomycosis. Does follow with podiatry.  PMH: Former smoker   ROS see history of present illness  Objective  Physical Exam Vitals:   05/16/17 1040  BP: 112/60  Pulse: 60  Temp: 97.9 F (36.6 C)    BP Readings from Last 3 Encounters:  05/16/17 112/60  02/13/17 100/68  11/25/16 135/70   Wt Readings from Last 3 Encounters:  05/16/17 135 lb 0.3 oz (61.2 kg)  02/13/17 144 lb (65.3 kg)  11/25/16 143 lb (64.9 kg)    Physical Exam  Constitutional: No distress.  Cardiovascular: Normal rate, regular rhythm and normal heart sounds.   Pulmonary/Chest: Effort normal and breath sounds normal.  Musculoskeletal: She exhibits no edema.  Neurological: She is alert. Gait normal.  Skin: She is not diaphoretic.  Bilateral onychomycosis noted, bruise of second toenail on the right side that is nontender     Assessment/Plan: Please see individual problem list.  Chronic venous insufficiency No edema today. Continue to monitor.  Hypertension At goal. Continue  current medications. Recent labs checked.  Neuropathy ([place]) Reports symptoms have improved with Lodine. She'll continue to monitor.  Prediabetes A1c is 6.2. Discussed diet and exercise.  Onychomycosis Followed by podiatry. Bruising of right second toenail after walking more than typical. She'll monitor this and if not improving let us know.   Tommi Rumps, MD [place]

## 2017-05-16 NOTE — Assessment & Plan Note (Signed)
At goal. Continue current medications. Recent labs checked.

## 2017-05-16 NOTE — Assessment & Plan Note (Signed)
Followed by podiatry. Bruising of right second toenail after walking more than typical. She'll monitor this and if not improving let us know.

## 2017-05-26 ENCOUNTER — Ambulatory Visit (INDEPENDENT_AMBULATORY_CARE_PROVIDER_SITE_OTHER): Payer: PPO | Admitting: Vascular Surgery

## 2017-05-29 ENCOUNTER — Other Ambulatory Visit: Payer: Self-pay

## 2017-05-29 ENCOUNTER — Other Ambulatory Visit: Payer: Self-pay | Admitting: Family Medicine

## 2017-05-29 ENCOUNTER — Ambulatory Visit (INDEPENDENT_AMBULATORY_CARE_PROVIDER_SITE_OTHER): Payer: PPO | Admitting: Vascular Surgery

## 2017-05-29 ENCOUNTER — Encounter (INDEPENDENT_AMBULATORY_CARE_PROVIDER_SITE_OTHER): Payer: Self-pay | Admitting: Vascular Surgery

## 2017-05-29 VITALS — BP 104/62 | HR 66 | Resp 16 | Wt 135.0 lb

## 2017-05-29 DIAGNOSIS — I10 Essential (primary) hypertension: Secondary | ICD-10-CM

## 2017-05-29 DIAGNOSIS — M170 Bilateral primary osteoarthritis of knee: Secondary | ICD-10-CM | POA: Diagnosis not present

## 2017-05-29 DIAGNOSIS — I890 Lymphedema, not elsewhere classified: Secondary | ICD-10-CM | POA: Diagnosis not present

## 2017-05-29 DIAGNOSIS — I872 Venous insufficiency (chronic) (peripheral): Secondary | ICD-10-CM

## 2017-05-29 NOTE — Telephone Encounter (Signed)
Please determine how often she takes the potassium and what she takes this for.

## 2017-05-29 NOTE — Progress Notes (Signed)
MRN : [id number]  Sandra Davidson is a 81 y.o. (Apr 25, 1936) female who presents with chief complaint of No chief complaint on file. Marland Kitchen  History of Present Illness: The patient returns to the office for followup evaluation regarding leg swelling.  The swelling has essentially resolved with the lymph pump. The pain associated with swelling is essentially eliminated. There have not been any interval development of a ulcerations or wounds.  She walked in a 3K recently with minimal difficulty.  She also has needed half of the NSAID medication to treat her knee pain.  The patient denies problems with the pump, noting it is working well and the leggings are in good condition.  Since the previous visit the patient has been wearing graduated compression stockings and using the lymph pump on a routine basis and  has noted significant improvement in the lymphedema.   Patient stated the lymph pump has been a very positive factor in her care.    No outpatient prescriptions have been marked as taking for the 05/29/17 encounter (Appointment) with Delana Meyer, Dolores Lory, MD.    Past Medical History:  Diagnosis Date  . Allergy   . Anxiety   . Arthritis   . Cancer ([place])    skin  . Depression   . GERD (gastroesophageal reflux disease)   . Hypertension   . Migraines     Past Surgical History:  Procedure Laterality Date  . BACK SURGERY    . MOHS SURGERY    . NECK SURGERY    . TONSILLECTOMY      Social History Social History  Substance Use Topics  . Smoking status: Former Research scientist (life sciences)  . Smokeless tobacco: Never Used  . Alcohol use No    Family History Family History  Problem Relation Age of Onset  . Congestive Heart Failure Mother   . COPD Mother   . Emphysema Mother   . Hypertension Mother   . Kidney disease Mother   . Cancer Father   . Kidney disease Father     Allergies  Allergen Reactions  . Codeine   . Demerol [Meperidine]   . Penicillins   . Sulfa Antibiotics      REVIEW OF  SYSTEMS (Negative unless checked)  Constitutional: [] Weight loss  [] Fever  [] Chills Cardiac: [] Chest pain   [] Chest pressure   [] Palpitations   [] Shortness of breath when laying flat   [] Shortness of breath with exertion. Vascular:  [x] Pain in legs with walking   [] Pain in legs at rest  [] History of DVT   [] Phlebitis   [x] Swelling in legs   [] Varicose veins   [] Non-healing ulcers Pulmonary:   [] Uses home oxygen   [] Productive cough   [] Hemoptysis   [] Wheeze  [] COPD   [] Asthma Neurologic:  [] Dizziness   [] Seizures   [] History of stroke   [] History of TIA  [] Aphasia   [] Vissual changes   [] Weakness or numbness in arm   [] Weakness or numbness in leg Musculoskeletal:   [] Joint swelling   [] Joint pain   [] Low back pain Hematologic:  [] Easy bruising  [] Easy bleeding   [] Hypercoagulable state   [] Anemic Gastrointestinal:  [] Diarrhea   [] Vomiting  [] Gastroesophageal reflux/heartburn   [] Difficulty swallowing. Genitourinary:  [] Chronic kidney disease   [] Difficult urination  [] Frequent urination   [] Blood in urine Skin:  [] Rashes   [] Ulcers  Psychological:  [] History of anxiety   []  History of major depression.  Physical Examination  There were no vitals filed for this visit. There is no height  or weight on file to calculate BMI. Gen: WD/WN, NAD Head: [place]/AT, No temporalis wasting.  Ear/Nose/Throat: Hearing grossly intact, nares w/o erythema or drainage Eyes: PER, EOMI, sclera nonicteric.  Neck: Supple, no large masses.   Pulmonary:  Good air movement, no audible wheezing bilaterally, no use of accessory muscles.  Cardiac: RRR, no JVD Vascular: scattered small varicosities present bilaterally.  moderate venous stasis changes to the legs bilaterally.  Trace soft pitting edema Vessel Right Left  Radial Palpable Palpable  PT Palpable Palpable  DP Palpable Palpable  Gastrointestinal: Non-distended. No guarding/no peritoneal signs.  Musculoskeletal: M/S 5/5 throughout.  No deformity or atrophy.    Neurologic: CN 2-12 intact. Symmetrical.  Speech is fluent. Motor exam as listed above. Psychiatric: Judgment intact, Mood & affect appropriate for pt's clinical situation. Dermatologic: No rashes or ulcers noted.  No changes consistent with cellulitis. Lymph : No lichenification or skin changes of chronic lymphedema.  CBC No results found for: WBC, HGB, HCT, MCV, PLT  BMET    Component Value Date/Time   NA 141 04/28/2017 1413   K 3.5 04/28/2017 1413   CL 106 04/28/2017 1413   CO2 24 04/28/2017 1413   GLUCOSE 121 (H) 04/28/2017 1413   BUN 13 04/28/2017 1413   CREATININE 0.87 04/28/2017 1413   CALCIUM 9.5 04/28/2017 1413   CrCl cannot be calculated (Patient's most recent lab result is older than the maximum 21 days allowed.).  COAG No results found for: INR, PROTIME  Radiology No results found.  Assessment/Plan 1. Chronic venous insufficiency  No surgery or intervention at this point in time.    I have reviewed my discussion with the patient regarding lymphedema and why it  causes symptoms.  Patient will continue wearing graduated compression stockings class 1 (20-30 mmHg) on a daily basis a prescription was given. The patient is reminded to put the stockings on first thing in the morning and removing them in the evening. The patient is instructed specifically not to sleep in the stockings.   In addition, behavioral modification throughout the day will be continued.  This will include frequent elevation (such as in a recliner), use of over the counter pain medications as needed and exercise such as walking.  I have reviewed systemic causes for chronic edema such as liver, kidney and cardiac etiologies and there does not appear to be any significant changes in these organ systems over the past year.  The patient is under the impression that these organ systems are all stable and unchanged.    The patient will continue aggressive use of the  lymph pump.  This will continue to  improve the edema control and prevent sequela such as ulcers and infections.   The patient will follow-up with me on an annual basis.    2. Lymphedema See #1  3. Essential hypertension Continue antihypertensive medications as already ordered, these medications have been reviewed and there are no changes at this time.   4. Primary osteoarthritis of both knees Continue Lodine as already ordered, these medications have been reviewed and there are no changes at this time.     Hortencia Pilar, MD  05/29/2017 11:49 AM

## 2017-05-29 NOTE — Telephone Encounter (Signed)
Last OV 05/16/17 filed under historical

## 2017-05-29 NOTE — Telephone Encounter (Signed)
Tried calling, no vm 

## 2017-06-04 ENCOUNTER — Other Ambulatory Visit: Payer: Self-pay | Admitting: Family Medicine

## 2017-06-04 NOTE — Telephone Encounter (Signed)
Pt called about needing the medication so she can take her lasix. Please advise?  Pharmacy is [place], [place]  Call pt @ [phone] .

## 2017-06-04 NOTE — Telephone Encounter (Signed)
Left message to return call 

## 2017-06-12 DIAGNOSIS — C44319 Basal cell carcinoma of skin of other parts of face: Secondary | ICD-10-CM | POA: Diagnosis not present

## 2017-06-24 ENCOUNTER — Other Ambulatory Visit: Payer: Self-pay | Admitting: Family Medicine

## 2017-06-24 NOTE — Telephone Encounter (Signed)
Last OV 05/16/17 last filled Alprazolam 04/23/17 30 1rf Potassium 06/04/17 30 0rf

## 2017-06-25 NOTE — Telephone Encounter (Signed)
Xanax Script faxed

## 2017-07-28 ENCOUNTER — Other Ambulatory Visit: Payer: Self-pay | Admitting: Family Medicine

## 2017-07-28 NOTE — Telephone Encounter (Signed)
Last OV was 05/16/2017, last refill was 06/25/2017, #30 0 refills, please advise, thanks

## 2017-07-30 NOTE — Telephone Encounter (Signed)
Pt called looking for an update. Pt states that she only has 2 ALPRAZolam (XANAX) 0.5 MG tablet left. Please advise, thank you!  Call pt @ [phone]

## 2017-08-01 NOTE — Telephone Encounter (Signed)
faxed

## 2017-08-18 ENCOUNTER — Ambulatory Visit (INDEPENDENT_AMBULATORY_CARE_PROVIDER_SITE_OTHER): Payer: PPO | Admitting: Family Medicine

## 2017-08-18 ENCOUNTER — Encounter: Payer: Self-pay | Admitting: Family Medicine

## 2017-08-18 DIAGNOSIS — G629 Polyneuropathy, unspecified: Secondary | ICD-10-CM

## 2017-08-18 DIAGNOSIS — F419 Anxiety disorder, unspecified: Secondary | ICD-10-CM

## 2017-08-18 DIAGNOSIS — I10 Essential (primary) hypertension: Secondary | ICD-10-CM | POA: Diagnosis not present

## 2017-08-18 DIAGNOSIS — F329 Major depressive disorder, single episode, unspecified: Secondary | ICD-10-CM | POA: Diagnosis not present

## 2017-08-18 DIAGNOSIS — I872 Venous insufficiency (chronic) (peripheral): Secondary | ICD-10-CM

## 2017-08-18 DIAGNOSIS — F32A Depression, unspecified: Secondary | ICD-10-CM

## 2017-08-18 MED ORDER — ALPRAZOLAM 0.5 MG PO TABS: ORAL_TABLET | ORAL | 2 refills | Status: DC

## 2017-08-18 MED ORDER — FUROSEMIDE 20 MG PO TABS: 20.0000 mg | ORAL_TABLET | Freq: Every day | ORAL | 3 refills | Status: DC

## 2017-08-18 MED ORDER — AMLODIPINE BESY-BENAZEPRIL HCL 10-20 MG PO CAPS
1.0000 | ORAL_CAPSULE | Freq: Every day | ORAL | 3 refills | Status: DC
Start: 2017-08-18 — End: 2017-11-26

## 2017-08-18 MED ORDER — POTASSIUM CHLORIDE ER 10 MEQ PO TBCR: EXTENDED_RELEASE_TABLET | ORAL | 1 refills | Status: DC

## 2017-08-18 NOTE — Assessment & Plan Note (Signed)
Improved. Continue to follow with vascular surgery.

## 2017-08-18 NOTE — Patient Instructions (Signed)
Nice to see you. I have refilled your medications. If your anxiety or depression worsens please let us know. If the Xanax makes you drowsy please let us know. If you develop thoughts of harming herself or others please seek medical attention immediately.

## 2017-08-18 NOTE — Assessment & Plan Note (Signed)
Improved with Lodine. Continue to monitor.

## 2017-08-18 NOTE — Progress Notes (Signed)
  Tommi Rumps, MD Phone: [phone]  Sandra Davidson is a 81 y.o. female who presents today for f/u.  HYPERTENSION  Disease Monitoring  Home BP Monitoring not checking Chest pain- no    Dyspnea- no Medications  Compliance-  taking amlodipine, benazepril, Lasix.   Edema- yes, see below  Anxiety/depression: Patient notes mostly anxiety. Xanax is helpful. She takes it in the morning and this is beneficial. Does not make her drowsy. She does note some depression. No SI or HI. She's not interested in any other medications for this.  Venous insufficiency: Followed by vascular surgery. Lasix has been very helpful with this. Lodine has been helpful with the neuropathy. She takes potassium with Lasix.   PMH: Former smoker   ROS see history of present illness  Objective  Physical Exam Vitals:   08/18/17 0903  BP: 108/60  Pulse: 61  Temp: 97.8 F (36.6 C)  SpO2: 98%    BP Readings from Last 3 Encounters:  08/18/17 108/60  05/29/17 104/62  05/16/17 112/60   Wt Readings from Last 3 Encounters:  08/18/17 142 lb 6.4 oz (64.6 kg)  05/29/17 135 lb (61.2 kg)  05/16/17 135 lb 0.3 oz (61.2 kg)    Physical Exam  Constitutional: No distress.  Cardiovascular: Normal rate, regular rhythm and normal heart sounds.   Pulmonary/Chest: Effort normal and breath sounds normal.  Musculoskeletal: She exhibits no edema.  Neurological: She is alert. Gait normal.  Skin: Skin is warm and dry. She is not diaphoretic.     Assessment/Plan: Please see individual problem list.  Chronic venous insufficiency Improved. Continue to follow with vascular surgery.  Hypertension At goal. Recent lab work reviewed. Refill medications.  Neuropathy ([facility]) Improved with Lodine. Continue to monitor.  Anxiety and depression Stable on Xanax. Refill given. Given return precautions.   No orders of the defined types were placed in this encounter.   Meds ordered this encounter  Medications  .  ALPRAZolam (XANAX) 0.5 MG tablet    Sig: TAKE 1 TABLET BY MOUTH DAILY AS NEEDED FOR ANXIETY    Dispense:  30 tablet    Refill:  2  . amLODipine-benazepril (LOTREL) 10-20 MG capsule    Sig: Take 1 capsule by mouth daily.    Dispense:  90 capsule    Refill:  3  . furosemide (LASIX) 20 MG tablet    Sig: Take 1 tablet (20 mg total) by mouth daily.    Dispense:  90 tablet    Refill:  3  . potassium chloride (K-DUR) 10 MEQ tablet    Sig: TAKE 1 TABLET BY MOUTH ONCE DAILY IF YOUTAKE FLUID PILL    Dispense:  90 tablet    Refill:  [place], MD [place]

## 2017-08-18 NOTE — Assessment & Plan Note (Signed)
Stable on Xanax. Refill given. Given return precautions.

## 2017-08-18 NOTE — Assessment & Plan Note (Signed)
At goal. Recent lab work reviewed. Refill medications.

## 2017-10-03 ENCOUNTER — Ambulatory Visit (INDEPENDENT_AMBULATORY_CARE_PROVIDER_SITE_OTHER): Payer: PPO | Admitting: Podiatry

## 2017-10-03 DIAGNOSIS — L603 Nail dystrophy: Secondary | ICD-10-CM

## 2017-10-03 DIAGNOSIS — B351 Tinea unguium: Secondary | ICD-10-CM

## 2017-10-03 DIAGNOSIS — L989 Disorder of the skin and subcutaneous tissue, unspecified: Secondary | ICD-10-CM

## 2017-10-03 DIAGNOSIS — M79676 Pain in unspecified toe(s): Secondary | ICD-10-CM | POA: Diagnosis not present

## 2017-10-06 NOTE — Progress Notes (Signed)
    Subjective: Patient is a 81 y.o. female presenting to the office today with a chief complaint of painful callus lesions to the bilateral feet that have been present for several months.  Patient also complains of elongated, thickened nails that cause pain while ambulating in shoes. She states her left great toenail is becoming detached from the nailbed. She reports associated soreness of the nail for the past several years. Patient is unable to trim their own nails. Patient presents today for further treatment and evaluation.   Past Medical History:  Diagnosis Date  . Allergy   . Anxiety   . Arthritis   . Cancer ([place])    skin  . Depression   . GERD (gastroesophageal reflux disease)   . Hypertension   . Migraines     Objective:  Physical Exam General: Alert and oriented x3 in no acute distress  Dermatology: Hyperkeratotic lesions present on the bilateral feet x 2. Pain on palpation with a central nucleated core noted. Skin is warm, dry and supple bilateral lower extremities. Negative for open lesions or macerations. Nails are tender, long, thickened and dystrophic with subungual debris, consistent with onychomycosis, 1-5 bilateral. No signs of infection noted.  Vascular: Palpable pedal pulses bilaterally. No edema or erythema noted. Capillary refill within normal limits.  Neurological: Epicritic and protective threshold grossly intact bilaterally.   Musculoskeletal Exam: Pain on palpation at the keratotic lesion noted. Range of motion within normal limits bilateral. Muscle strength 5/5 in all groups bilateral.  Assessment: 1. Onychodystrophic nails 1-5 bilateral with hyperkeratosis of nails.  2. Onychomycosis of nail due to dermatophyte bilateral 3. Porokeratosis to the bilateral feet x 2 4. Painful dystrophic left great toenail   Plan of Care:  #1 Patient evaluated. #2 Excisional debridement of keratoic lesion using a chisel blade was performed without incident.  #3  Dressed with light dressing. #4 Mechanical debridement of nails 1-5 bilaterally performed using a nail nipper. Filed with dremel without incident.  #5 Discussed treatment alternatives and plan of care. Explained nail avulsion procedure and post procedure course to patient. #6 Patient opted for total temporary nail avulsion of the left great toenail.  #7 Prior to procedure, local anesthesia infiltration utilized using 3 ml of a 50:50 mixture of 2% plain lidocaine and 0.5% plain marcaine in a normal hallux block fashion and a betadine prep performed.  #8 Light dressing applied. #9 Return to clinic in 2 weeks.   Edrick Kins, DPM [facility]  Dr. Edrick Kins, [address]                                        [place], [place] [address]                Office [phone]  Fax [phone]

## 2017-10-14 ENCOUNTER — Encounter (INDEPENDENT_AMBULATORY_CARE_PROVIDER_SITE_OTHER): Payer: Self-pay

## 2017-10-17 ENCOUNTER — Ambulatory Visit (INDEPENDENT_AMBULATORY_CARE_PROVIDER_SITE_OTHER): Payer: PPO | Admitting: Podiatry

## 2017-10-17 DIAGNOSIS — G609 Hereditary and idiopathic neuropathy, unspecified: Secondary | ICD-10-CM

## 2017-10-17 MED ORDER — NONFORMULARY OR COMPOUNDED ITEM: 2 refills | Status: DC

## 2017-10-20 NOTE — Progress Notes (Signed)
    Subjective: Patient is a 81 y.o. female presenting to the office today for follow up evaluation of pain to the right foot secondary to a callus lesion. She reports pain to the right great toe when walking. She reports pain to the left great toenail as well. Walking with shoes on exacerbates the pain. Patient presents today for further treatment and evaluation.   Past Medical History:  Diagnosis Date  . Allergy   . Anxiety   . Arthritis   . Cancer ([place])    skin  . Depression   . GERD (gastroesophageal reflux disease)   . Hypertension   . Migraines     Objective:  Physical Exam General: Alert and oriented x3 in no acute distress  Dermatology: Skin is warm, dry and supple bilateral lower extremities. Negative for open lesions or macerations.  Vascular: Palpable pedal pulses bilaterally. No edema or erythema noted. Capillary refill within normal limits.  Neurological: Paresthesia with burning sensation to bilateral feet more symptomatic to the left great toe.  Musculoskeletal Exam: Pain on palpation at the keratotic lesion noted. Range of motion within normal limits bilateral. Muscle strength 5/5 in all groups bilateral.  Assessment: 1. Idiopathic peripheral neuropathy   Plan of Care:  #1 Patient evaluated. #2 Prescription for neuropathy pain cream to be dispensed from [place].  #3 Return to clinic when necessary.    Edrick Kins, DPM [facility]  Dr. Edrick Kins, [address]                                        [place], [place] [address]                Office [phone]  Fax [phone]

## 2017-11-26 ENCOUNTER — Ambulatory Visit (INDEPENDENT_AMBULATORY_CARE_PROVIDER_SITE_OTHER): Payer: PPO | Admitting: Family Medicine

## 2017-11-26 ENCOUNTER — Encounter: Payer: Self-pay | Admitting: Family Medicine

## 2017-11-26 ENCOUNTER — Other Ambulatory Visit: Payer: Self-pay

## 2017-11-26 VITALS — BP 120/70 | HR 71 | Temp 97.8°F | Wt 149.2 lb

## 2017-11-26 DIAGNOSIS — R7303 Prediabetes: Secondary | ICD-10-CM

## 2017-11-26 DIAGNOSIS — G609 Hereditary and idiopathic neuropathy, unspecified: Secondary | ICD-10-CM | POA: Diagnosis not present

## 2017-11-26 DIAGNOSIS — M170 Bilateral primary osteoarthritis of knee: Secondary | ICD-10-CM | POA: Diagnosis not present

## 2017-11-26 DIAGNOSIS — I10 Essential (primary) hypertension: Secondary | ICD-10-CM

## 2017-11-26 DIAGNOSIS — I872 Venous insufficiency (chronic) (peripheral): Secondary | ICD-10-CM

## 2017-11-26 DIAGNOSIS — F419 Anxiety disorder, unspecified: Secondary | ICD-10-CM

## 2017-11-26 DIAGNOSIS — F329 Major depressive disorder, single episode, unspecified: Secondary | ICD-10-CM

## 2017-11-26 DIAGNOSIS — G629 Polyneuropathy, unspecified: Secondary | ICD-10-CM | POA: Diagnosis not present

## 2017-11-26 LAB — HEMOGLOBIN A1C: Hgb A1c MFr Bld: 6 % (ref 4.6–6.5)

## 2017-11-26 LAB — COMPREHENSIVE METABOLIC PANEL
ALT: 10 U/L (ref 0–35)
AST: 13 U/L (ref 0–37)
Albumin: 4.2 g/dL (ref 3.5–5.2)
Alkaline Phosphatase: 69 U/L (ref 39–117)
BUN: 21 mg/dL (ref 6–23)
CALCIUM: 8.7 mg/dL (ref 8.4–10.5)
CHLORIDE: 106 meq/L (ref 96–112)
CO2: 26 meq/L (ref 19–32)
Creatinine, Ser: 0.89 mg/dL (ref 0.40–1.20)
GFR: 64.98 mL/min (ref >60.00)
GLUCOSE: 98 mg/dL (ref 70–99)
Potassium: 4.1 mEq/L (ref 3.5–5.1)
SODIUM: 140 meq/L (ref 135–145)
Total Bilirubin: 0.3 mg/dL (ref 0.2–1.2)
Total Protein: 7.2 g/dL (ref 6.0–8.3)

## 2017-11-26 LAB — VITAMIN B12: Vitamin B-12: 321 pg/mL (ref 211–911)

## 2017-11-26 MED ORDER — ALPRAZOLAM 0.5 MG PO TABS: ORAL_TABLET | ORAL | 2 refills | Status: DC

## 2017-11-26 MED ORDER — FUROSEMIDE 20 MG PO TABS: 20.0000 mg | ORAL_TABLET | Freq: Every day | ORAL | 3 refills | Status: DC

## 2017-11-26 MED ORDER — POTASSIUM CHLORIDE ER 10 MEQ PO TBCR: EXTENDED_RELEASE_TABLET | ORAL | 1 refills | Status: DC

## 2017-11-26 MED ORDER — AMLODIPINE BESY-BENAZEPRIL HCL 10-20 MG PO CAPS: 1.0000 | ORAL_CAPSULE | Freq: Every day | ORAL | 3 refills | Status: DC

## 2017-11-26 MED ORDER — ETODOLAC 400 MG PO TABS: 400.0000 mg | ORAL_TABLET | Freq: Every day | ORAL | 1 refills | Status: DC

## 2017-11-26 MED ORDER — GABAPENTIN 100 MG PO CAPS: 100.0000 mg | ORAL_CAPSULE | Freq: Every day | ORAL | 3 refills | Status: DC

## 2017-11-26 NOTE — Assessment & Plan Note (Signed)
Slight flare currently given that she has been out of Lodine.  Refill given.

## 2017-11-26 NOTE — Assessment & Plan Note (Signed)
Anxiety is minimal.  Continue Xanax.

## 2017-11-26 NOTE — Assessment & Plan Note (Signed)
Well-controlled.  Continue current regimen.  Check lab work. 

## 2017-11-26 NOTE — Patient Instructions (Signed)
Nice to see you. We will check lab work and contact you with the result. We will try you on gabapentin for your neuropathy.  If this makes you too drowsy please let us know.  If you do not notice a benefit after a week or 2 please let us know as well.  Please do not take this when you take your Xanax.

## 2017-11-26 NOTE — Progress Notes (Signed)
Tommi Rumps, MD Phone: [phone]  Sandra Davidson is a 81 y.o. female who presents today for follow-up.  Hypertension: Not checking at home.  She is taking amlodipine, benazepril.  Taking Lasix with potassium.  No chest pain or shortness of breath.  She has chronic lower extremity swelling likely related to venous insufficiency though also osteoarthritis.  She takes ibuprofen occasionally she is out of Lodine though takes Lodine once a day.  Notes her ankles have been swelling since she ran out of loading about a week ago.  Patient also reports peripheral neuropathy.  Feet feel numb and hurt at night.  Some restless leg symptoms with it.  She was supposed to start gabapentin previously though did not start this.  Anxiety: Occasionally feels anxious.  The Xanax works okay for her.  Occasionally feels down though nothing significant.  No SI.  Social History   Tobacco Use  Smoking Status Former Smoker  Smokeless Tobacco Never Used     ROS see history of present illness  Objective  Physical Exam Vitals:   11/26/17 0806  BP: 120/70  Pulse: 71  Temp: 97.8 F (36.6 C)  SpO2: 98%    BP Readings from Last 3 Encounters:  11/26/17 120/70  08/18/17 108/60  05/29/17 104/62   Wt Readings from Last 3 Encounters:  11/26/17 149 lb 3.2 oz (67.7 kg)  08/18/17 142 lb 6.4 oz (64.6 kg)  05/29/17 135 lb (61.2 kg)    Physical Exam  Constitutional: No distress.  Cardiovascular: Normal rate, regular rhythm and normal heart sounds.  Pulmonary/Chest: Effort normal and breath sounds normal.  Musculoskeletal: She exhibits edema (trace edema to ankles).  Neurological: She is alert. Gait normal.  Skin: Skin is warm and dry. She is not diaphoretic.     Assessment/Plan: Please see individual problem list.  Chronic venous insufficiency Chronic venous insufficiency.  Occasional edema.  Discussed amlodipine could be playing a role in this.  Also suspect arthritis is playing a role.  She  does not want to change her blood pressure medication.  She will continue to monitor.  Hypertension Well-controlled.  Continue current regimen.  Check lab work.  Neuropathy (HCC) Neuropathy symptoms.  We will check a B12.  Start on gabapentin 100 mg nightly.  Discussed this could make her drowsy and if it does she will let us know.  Refill of Lodine.  Osteoarthritis Slight flare currently given that she has been out of Lodine.  Refill given.  Prediabetes Check A1c.  Anxiety and depression Anxiety is minimal.  Continue Xanax.   Sandra Davidson was seen today for follow-up.  Diagnoses and all orders for this visit:  Prediabetes -     HgB A1c  Essential hypertension -     Comp Met (CMET)  Idiopathic peripheral neuropathy -     B12  Chronic venous insufficiency  Neuropathy  Primary osteoarthritis of both knees  Anxiety and depression  Other orders -     ALPRAZolam (XANAX) 0.5 MG tablet; TAKE 1 TABLET BY MOUTH DAILY AS NEEDED FOR ANXIETY -     amLODipine-benazepril (LOTREL) 10-20 MG capsule; Take 1 capsule by mouth daily. -     etodolac (LODINE) 400 MG tablet; Take 1 tablet (400 mg total) by mouth daily. -     furosemide (LASIX) 20 MG tablet; Take 1 tablet (20 mg total) by mouth daily. -     potassium chloride (K-DUR) 10 MEQ tablet; TAKE 1 TABLET BY MOUTH ONCE DAILY IF YOUTAKE FLUID PILL -  gabapentin (NEURONTIN) 100 MG capsule; Take 1 capsule (100 mg total) by mouth at bedtime.    Orders Placed This Encounter  Procedures  . HgB A1c  . Comp Met (CMET)  . B12    Meds ordered this encounter  Medications  . ALPRAZolam (XANAX) 0.5 MG tablet    Sig: TAKE 1 TABLET BY MOUTH DAILY AS NEEDED FOR ANXIETY    Dispense:  30 tablet    Refill:  2  . amLODipine-benazepril (LOTREL) 10-20 MG capsule    Sig: Take 1 capsule by mouth daily.    Dispense:  90 capsule    Refill:  3  . etodolac (LODINE) 400 MG tablet    Sig: Take 1 tablet (400 mg total) by mouth daily.    Dispense:  90  tablet    Refill:  1  . furosemide (LASIX) 20 MG tablet    Sig: Take 1 tablet (20 mg total) by mouth daily.    Dispense:  90 tablet    Refill:  3  . potassium chloride (K-DUR) 10 MEQ tablet    Sig: TAKE 1 TABLET BY MOUTH ONCE DAILY IF YOUTAKE FLUID PILL    Dispense:  90 tablet    Refill:  1  . gabapentin (NEURONTIN) 100 MG capsule    Sig: Take 1 capsule (100 mg total) by mouth at bedtime.    Dispense:  90 capsule    Refill:  [place], MD [place]

## 2017-11-26 NOTE — Assessment & Plan Note (Signed)
Check A1c. 

## 2017-11-26 NOTE — Assessment & Plan Note (Signed)
Chronic venous insufficiency.  Occasional edema.  Discussed amlodipine could be playing a role in this.  Also suspect arthritis is playing a role.  She does not want to change her blood pressure medication.  She will continue to monitor.

## 2017-11-26 NOTE — Assessment & Plan Note (Signed)
Neuropathy symptoms.  We will check a B12.  Start on gabapentin 100 mg nightly.  Discussed this could make her drowsy and if it does she will let us know.  Refill of Lodine.

## 2017-12-03 ENCOUNTER — Telehealth (INDEPENDENT_AMBULATORY_CARE_PROVIDER_SITE_OTHER): Payer: Self-pay | Admitting: Vascular Surgery

## 2017-12-03 NOTE — Telephone Encounter (Signed)
I'm not sure about taking Iodine twice daily for swelling. I would clarify that.  The patient can try farrow wraps.  They may be easier for her to place on and wear.  I can leave a prescription at the front desk for her.

## 2017-12-03 NOTE — Telephone Encounter (Signed)
New Message  Pt verbalized she has swelling in both of her legs but initiated in left leg. Cannot wear compression stockings now.  Pt verbalized her feet hers badly.  Pt verbalized she used her lympha press and her feet goes down for a few minutes and then swell back up.  Please f/u with pt

## 2017-12-03 NOTE — Telephone Encounter (Signed)
The patient was last seen end of June 2018.  Wearing either conventional compression stockings or farrow wraps are an important part of controlling lymphedema.  I strongly recommend her consistently wearing 20-31mmhg compression to the bilateral lower extremity.  The patient should elevate her legs heart level or higher.  The patient should use her lymphedema pump at least twice a day for an hour each time.  Patient should be using her lymphedema pump while her compression stockings are on in her feet are elevated.

## 2017-12-03 NOTE — Telephone Encounter (Signed)
The patient said that she spoke to Dr. Delana Meyer about taking the Lodine, as prescribed by her primary for her swelling. He told her that "as long as the medication was prescribed that way by the primary, then she could try that even though it's a older treatment."   I can call her back and speak to her about the farrow wraps and see what she says.

## 2017-12-03 NOTE — Telephone Encounter (Signed)
Called the patient back to let her know what was advised. She states that she spoke to Dr. Delana Meyer about taking her lodine twice daily for the swelling, and she says that she is going to do that. She also states that the compression stocking, she can't get them on and that she has not been wearing them. She went on to say that she has been using the lymph pump three times a day, and that she will try to elevate above heart level since she can not get the compression stockings on.

## 2018-01-02 ENCOUNTER — Emergency Department: Payer: PPO

## 2018-01-02 ENCOUNTER — Other Ambulatory Visit: Payer: Self-pay

## 2018-01-02 ENCOUNTER — Encounter: Payer: Self-pay | Admitting: Emergency Medicine

## 2018-01-02 ENCOUNTER — Emergency Department
Admission: EM | Admit: 2018-01-02 | Discharge: 2018-01-02 | Disposition: A | Discharge status: Home / Self Care | Payer: PPO | Attending: Emergency Medicine | Admitting: Emergency Medicine

## 2018-01-02 DIAGNOSIS — R42 Dizziness and giddiness: Secondary | ICD-10-CM | POA: Diagnosis not present

## 2018-01-02 DIAGNOSIS — R079 Chest pain, unspecified: Secondary | ICD-10-CM | POA: Insufficient documentation

## 2018-01-02 DIAGNOSIS — Z79899 Other long term (current) drug therapy: Secondary | ICD-10-CM | POA: Insufficient documentation

## 2018-01-02 DIAGNOSIS — E119 Type 2 diabetes mellitus without complications: Secondary | ICD-10-CM | POA: Diagnosis not present

## 2018-01-02 DIAGNOSIS — Z87891 Personal history of nicotine dependence: Secondary | ICD-10-CM | POA: Insufficient documentation

## 2018-01-02 LAB — CBC
HCT: 41 % (ref 35.0–47.0)
HEMOGLOBIN: 13.8 g/dL (ref 12.0–16.0)
MCH: 31.3 pg (ref 26.0–34.0)
MCHC: 33.6 g/dL (ref 32.0–36.0)
MCV: 93.1 fL (ref 80.0–100.0)
Platelets: 194 10*3/uL (ref 150–440)
RBC: 4.41 MIL/uL (ref 3.80–5.20)
RDW: 13 % (ref 11.5–14.5)
WBC: 5.8 10*3/uL (ref 3.6–11.0)

## 2018-01-02 LAB — URINALYSIS, COMPLETE (UACMP) WITH MICROSCOPIC
BILIRUBIN URINE: NEGATIVE
GLUCOSE, UA: NEGATIVE mg/dL
HGB URINE DIPSTICK: NEGATIVE
KETONES UR: NEGATIVE mg/dL
NITRITE: NEGATIVE
PH: 5 (ref 5.0–8.0)
Protein, ur: NEGATIVE mg/dL
SPECIFIC GRAVITY, URINE: 1.012 (ref 1.005–1.030)

## 2018-01-02 LAB — BASIC METABOLIC PANEL
ANION GAP: 10 (ref 5–15)
BUN: 22 mg/dL — ABNORMAL HIGH (ref 6–20)
CALCIUM: 9.1 mg/dL (ref 8.9–10.3)
CO2: 26 mmol/L (ref 22–32)
Chloride: 106 mmol/L (ref 101–111)
Creatinine, Ser: 0.88 mg/dL (ref 0.44–1.00)
GFR calc non Af Amer: >60 mL/min (ref >60)
Glucose, Bld: 102 mg/dL — ABNORMAL HIGH (ref 65–99)
Potassium: 4.8 mmol/L (ref 3.5–5.1)
SODIUM: 142 mmol/L (ref 135–145)

## 2018-01-02 LAB — TROPONIN I: Troponin I: <0.03 ng/mL (ref <0.03)

## 2018-01-02 MED ORDER — CIPROFLOXACIN HCL 500 MG PO TABS: 500.0000 mg | ORAL_TABLET | Freq: Two times a day (BID) | ORAL | 0 refills | Status: DC

## 2018-01-02 NOTE — ED Triage Notes (Signed)
First Nurse Notes:  C/O 2-3 day history of intermittent chest pain, confusion, and dizziness.  Patient is AAOx3.  Skin warm and dry.  NAD

## 2018-01-02 NOTE — ED Notes (Signed)
Lab called and informed of the add-on troponin to the blood work previously sent down for patient.  Lab verbalized understanding of this information.

## 2018-01-02 NOTE — ED Triage Notes (Signed)
Pt to ED via POV c/o dizziness on and off x 1 month. Pt states that she gets the feeling of passing out but that she has not passed out. Pt denies chest pain, shortness of breath, or numbness or weakness on one side. Pt in NAD at this time.

## 2018-01-02 NOTE — ED Provider Notes (Signed)
[facility] Emergency Department Provider Note   ____________________________________________    I have reviewed the triage vital signs and the nursing notes.   HISTORY  Chief Complaint Dizziness     HPI Sandra Davidson is a 82 y.o. female who presents with complaints of dizziness and lightheadedness.  Patient reports she was walking down the hallway when she felt like the room was moving in front of her, she also felt slightly lightheaded.  She became anxious and developed some chest tightness which has resolved upon arrival to the emergency department.  No diaphoresis.  No fevers or chills.  No shortness of breath calf pain or swelling.  She reports over the last month she has had several episodes of dizziness.  She denies neuro deficits.  No headache.   Past Medical History:  Diagnosis Date  . Allergy   . Anxiety   . Arthritis   . Cancer ([place])    skin  . Depression   . Diabetes mellitus without complication ([place])   . GERD (gastroesophageal reflux disease)   . Hypertension   . Migraines     Patient Active Problem List   Diagnosis Date Noted  . Prediabetes 05/16/2017  . Onychomycosis 05/16/2017  . Hypertension 02/13/2017  . Skin cancer 02/13/2017  . Anxiety and depression 02/13/2017  . Osteoarthritis 02/13/2017  . Chronic venous insufficiency 09/26/2016  . Lumbar back pain 09/26/2016  . Neuropathy 09/26/2016    Past Surgical History:  Procedure Laterality Date  . BACK SURGERY    . MOHS SURGERY    . NECK SURGERY    . TONSILLECTOMY      Prior to Admission medications   Medication Sig Start Date End Date Taking? Authorizing Provider  ALPRAZolam Duanne Moron) 0.5 MG tablet TAKE 1 TABLET BY MOUTH DAILY AS NEEDED FOR ANXIETY 11/26/17   Leone Haven, MD  amLODipine-benazepril (LOTREL) 10-20 MG capsule Take 1 capsule by mouth daily. 11/26/17   Leone Haven, MD  ciprofloxacin (CIPRO) 500 MG tablet Take 1 tablet (500 mg total) by  mouth 2 (two) times daily. 01/02/18   Lavonia Drafts, MD  etodolac (LODINE) 400 MG tablet Take 1 tablet (400 mg total) by mouth daily. 11/26/17   Leone Haven, MD  furosemide (LASIX) 20 MG tablet Take 1 tablet (20 mg total) by mouth daily. 11/26/17   Leone Haven, MD  gabapentin (NEURONTIN) 100 MG capsule Take 1 capsule (100 mg total) by mouth at bedtime. 11/26/17   Leone Haven, MD  ibuprofen (ADVIL,MOTRIN) 600 MG tablet TAKE 1 TABLET BY MOUTH EVERY 8 HOURS AS NEEDED 05/29/17   Leone Haven, MD  NONFORMULARY OR COMPOUNDED ITEM See pharmacy note 10/17/17   Edrick Kins, DPM  potassium chloride (K-DUR) 10 MEQ tablet TAKE 1 TABLET BY MOUTH ONCE DAILY IF YOUTAKE FLUID PILL 11/26/17   Leone Haven, MD     Allergies Codeine; Demerol [meperidine]; Penicillins; and Sulfa antibiotics  Family History  Problem Relation Age of Onset  . Congestive Heart Failure Mother   . COPD Mother   . Emphysema Mother   . Hypertension Mother   . Kidney disease Mother   . Cancer Father   . Kidney disease Father     Social History Social History   Tobacco Use  . Smoking status: Former Research scientist (life sciences)  . Smokeless tobacco: Never Used  Substance Use Topics  . Alcohol use: No  . Drug use: No    Review of Systems  Constitutional:  No fever/chills Eyes: No visual changes.  ENT: No neck pain Cardiovascular: Denies chest pain. Respiratory: Denies shortness of breath. Gastrointestinal: No abdominal pain.  No nausea, no vomiting.   Genitourinary: Negative for dysuria. Musculoskeletal: Negative for back pain. Skin: Negative for rash. Neurological: Negative for headaches    ____________________________________________   PHYSICAL EXAM:  VITAL SIGNS: ED Triage Vitals  Enc Vitals Group     BP 01/02/18 1522 (!) 138/54     Pulse Rate 01/02/18 1522 69     Resp 01/02/18 1522 16     Temp 01/02/18 1522 98.1 F (36.7 C)     Temp Source 01/02/18 1522 Oral     SpO2 01/02/18 1522 97 %      Weight 01/02/18 1523 63.5 kg (140 lb)     Height 01/02/18 1523 1.702 m (5\' 7" )     Head Circumference --      Peak Flow --      Pain Score --      Pain Loc --      Pain Edu? --      Excl. in [place]? --     Constitutional: Alert and oriented. No acute distress. Pleasant and interactive Eyes: Conjunctivae are normal.  PERRLA, EOMI  Nose: No congestion/rhinnorhea. Mouth/Throat: Mucous membranes are moist.   Neck:  Painless ROM Cardiovascular: Normal rate, regular rhythm. Grossly normal heart sounds.  Good peripheral circulation. Respiratory: Normal respiratory effort.  No retractions. Lungs CTAB. Gastrointestinal: Soft and nontender. No distention.  No CVA tenderness. Genitourinary: deferred Musculoskeletal:   Warm and well perfused Neurologic:  Normal speech and language. No gross focal neurologic deficits are appreciated.  Skin:  Skin is warm, dry and intact. No rash noted. Psychiatric: Mood and affect are normal. Speech and behavior are normal.  ____________________________________________   LABS (all labs ordered are listed, but only abnormal results are displayed)  Labs Reviewed  BASIC METABOLIC PANEL - Abnormal; Notable for the following components:      Result Value   Glucose, Bld 102 (*)    BUN 22 (*)    All other components within normal limits  URINALYSIS, COMPLETE (UACMP) WITH MICROSCOPIC - Abnormal; Notable for the following components:   Color, Urine YELLOW (*)    APPearance HAZY (*)    Leukocytes, UA MODERATE (*)    Bacteria, UA RARE (*)    Squamous Epithelial / LPF 0-5 (*)    All other components within normal limits  CBC  TROPONIN I  CBG MONITORING, ED   ____________________________________________  EKG  ED ECG REPORT I, Lavonia Drafts, the attending physician, personally viewed and interpreted this ECG.  Date: 01/02/2018  Rhythm: normal sinus rhythm QRS Axis: normal Intervals: normal ST/T Wave abnormalities: normal Narrative Interpretation: no  evidence of acute ischemia  ____________________________________________  RADIOLOGY  Chest x-ray unremarkable  Radiology has noted ill-defined densities which I discussed with the patient for follow-up ____________________________________________   PROCEDURES  Procedure(s) performed: No  Procedures   Critical Care performed: No ____________________________________________   INITIAL IMPRESSION / ASSESSMENT AND PLAN / ED COURSE  Pertinent labs & imaging results that were available during my care of the patient were reviewed by me and considered in my medical decision making (see chart for details).  Patient is symptom-free essentially upon arrival to the emergency department.  No chest pain.  She is able to ambulate without dizziness.  No neuro deficits.  Reassuring exam.  Lab work is unremarkable.  Added on troponin which was normal.  EKG unchanged  from prior.  Chest discomfort concerning giving her age however no history of smoking, normal blood pressure no other risk factors for heart disease and no further chest pain and the patient would greatly like to go home with outpatient follow-up.  I feel this is reasonable.  Will discharge with cardiology follow-up.  Return precautions discussed    ____________________________________________   FINAL CLINICAL IMPRESSION(S) / ED DIAGNOSES  Final diagnoses:  Lightheadedness  Chest pain, unspecified type        Note:  This document was prepared using Dragon voice recognition software and may include unintentional dictation errors.    Lavonia Drafts, MD 01/02/18 2010

## 2018-01-05 ENCOUNTER — Telehealth: Payer: Self-pay

## 2018-01-05 ENCOUNTER — Telehealth: Payer: Self-pay | Admitting: Family Medicine

## 2018-01-05 DIAGNOSIS — R9389 Abnormal findings on diagnostic imaging of other specified body structures: Secondary | ICD-10-CM

## 2018-01-05 NOTE — Telephone Encounter (Signed)
Copied from [place] [id number]. Topic: Referral - Request >> Jan 05, 2018  4:17 PM Corie Chiquito, [place] wrote: Reason for CRM: Patient calling again because she would like to have a referral to have a chest x-ray done. If someone could give her a call back about this at [phone].Patient does have an appoinment this Thurs so she stated that is not a good day to have her x-ray done.

## 2018-01-05 NOTE — Telephone Encounter (Signed)
Please advise 

## 2018-01-05 NOTE — Telephone Encounter (Signed)
1:15 pm on Wednesday. Thanks.

## 2018-01-05 NOTE — Telephone Encounter (Signed)
Should be done later this week or next week.

## 2018-01-05 NOTE — Telephone Encounter (Signed)
Copied from [email or web address]. Topic: General - Other >> Jan 05, 2018  9:09 AM Carolyn Stare wrote:  Pt was in the ER on 01/02/18 for chest pains and sob and now she need a ER fup and [facility] is full . They ask that she has a follow up chest xray. Is there anyway [facility] call squeeze her. Pt would like a call back   [phone]

## 2018-01-05 NOTE — Telephone Encounter (Signed)
Repeat ordered.  Please let her know to have this done.

## 2018-01-06 NOTE — Telephone Encounter (Signed)
scheduled

## 2018-01-07 ENCOUNTER — Ambulatory Visit
Admission: RE | Admit: 2018-01-07 | Discharge: 2018-01-07 | Disposition: A | Discharge status: Home / Self Care | Payer: PPO | Source: Ambulatory Visit | Attending: Family Medicine | Admitting: Family Medicine

## 2018-01-07 ENCOUNTER — Ambulatory Visit (INDEPENDENT_AMBULATORY_CARE_PROVIDER_SITE_OTHER): Payer: PPO

## 2018-01-07 ENCOUNTER — Ambulatory Visit (INDEPENDENT_AMBULATORY_CARE_PROVIDER_SITE_OTHER): Payer: PPO | Admitting: Family Medicine

## 2018-01-07 ENCOUNTER — Encounter: Payer: Self-pay | Admitting: Family Medicine

## 2018-01-07 VITALS — BP 102/62 | HR 69 | Temp 97.4°F | Wt 151.0 lb

## 2018-01-07 DIAGNOSIS — R9389 Abnormal findings on diagnostic imaging of other specified body structures: Secondary | ICD-10-CM

## 2018-01-07 DIAGNOSIS — R079 Chest pain, unspecified: Secondary | ICD-10-CM

## 2018-01-07 DIAGNOSIS — N3000 Acute cystitis without hematuria: Secondary | ICD-10-CM

## 2018-01-07 DIAGNOSIS — R29818 Other symptoms and signs involving the nervous system: Secondary | ICD-10-CM

## 2018-01-07 DIAGNOSIS — R42 Dizziness and giddiness: Secondary | ICD-10-CM | POA: Diagnosis not present

## 2018-01-07 DIAGNOSIS — N390 Urinary tract infection, site not specified: Secondary | ICD-10-CM | POA: Insufficient documentation

## 2018-01-07 NOTE — Progress Notes (Signed)
Tommi Rumps, MD Phone: [phone]  Sandra Davidson is a 82 y.o. female who presents today for follow-up.  She was seen in the emergency department about 5 days ago.  She had gotten up to go downstairs at her living facility and felt lightheaded.  Then felt some dizziness as if she was dizzy inside of her head.  Worsened with rotating her head.  She subsequently developed some chest pressure and heaviness that did go down her right arm.  No shortness of breath.  No diaphoresis.  No nausea.  No numbness or weakness.  No speech changes.  No facial droop.  No syncope.  No memory loss.  She remembers the entirety of the event.  May be had a tiny bit of blurry vision during the episode.  Her symptoms resolved after about an hour.  When she got downstairs at her living facility they checked her blood pressure and it was 114/54.  They felt she just did not look right and wanted to get her evaluated.  She states they advised her that her eyes just did not look right.  She notes no confusion during the episode.  She was treated with ciprofloxacin for a positive UA.  She does have some urinary frequency that is chronic though had no dysuria or urgency.  She has had somewhat similar symptoms in the past with her UTIs.  She has not had any recurrence of the chest heaviness.  She at times does still feel dizzy if she moves her head too quickly.  Also feels slightly off balance with gait.  Additionally had ill-defined densities on chest x-ray.  Needs repeat.  Social History   Tobacco Use  Smoking Status Former Smoker  Smokeless Tobacco Never Used     ROS see history of present illness  Objective  Physical Exam Vitals:   01/07/18 0947  BP: 102/62  Pulse: 69  Temp: (!) 97.4 F (36.3 C)  SpO2: 97%    BP Readings from Last 3 Encounters:  01/07/18 102/62  01/02/18 120/61  11/26/17 120/70   Wt Readings from Last 3 Encounters:  01/07/18 151 lb (68.5 kg)  01/02/18 140 lb (63.5 kg)  11/26/17 149  lb 3.2 oz (67.7 kg)    Physical Exam  Constitutional: No distress.  Cardiovascular: Normal rate, regular rhythm and normal heart sounds.  Pulmonary/Chest: Effort normal and breath sounds normal.  Musculoskeletal: She exhibits no edema.  Neurological: She is alert.  CN 2-12 intact, 5/5 strength in bilateral biceps, triceps, grip, quads, hamstrings, plantar and dorsiflexion, sensation to light touch intact in bilateral UE and LE, unable to tolerate Dix-Hallpike due to prior neck surgery, slight difficulty on hand rapid alternating movement, normal finger to nose, normal heel to shin, slight imbalance on gait  Skin: Skin is warm and dry. She is not diaphoretic.     Assessment/Plan: Please see individual problem list.  Vertigo Symptoms somewhat concerning for vertigo.  Has persisted to some degree and also has some balance issues that may or may not have been going on prior to the event she had 5 days ago.  Given her persistent symptoms and her risk factors for stroke I think she warrants evaluation with MRI without stroke.  If negative she will see cardiology tomorrow as planned and if their evaluation is unremarkable we could have her see neurology.  She is given return precautions.  Chest pain Evaluated in the emergency department.  No recurrence of these symptoms.  Concerning for cardiac cause given risk  factors.  She will see cardiology tomorrow.  Given return precautions.  UTI (urinary tract infection) Treated for UTI with Cipro.  Asymptomatic.  She will can continue this given that in the past her UTIs have resulted in somewhat similar symptoms.  Abnormal chest x-ray Repeat done today.  If ill-defined densities remain consider CT chest.   Orders Placed This Encounter  Procedures  . MR Brain Wo Contrast    Standing Status:   Future    Number of Occurrences:   1    Standing Expiration Date:   03/08/2019    Order Specific Question:   What is the patient's sedation requirement?     Answer:   No Sedation    Order Specific Question:   Does the patient have a pacemaker or implanted devices?    Answer:   No    Order Specific Question:   Preferred imaging location?    Answer:   [facility] (table limit-300lbs)    Order Specific Question:   Call Results- Best Contact Number?    Answer:   [phone] -hold patient    Order Specific Question:   Radiology Contrast Protocol - do NOT remove file path    Answer:   \\[email or web address]    No orders of the defined types were placed in this encounter.    Tommi Rumps, MD [place]

## 2018-01-07 NOTE — Assessment & Plan Note (Signed)
Treated for UTI with Cipro.  Asymptomatic.  She will can continue this given that in the past her UTIs have resulted in somewhat similar symptoms.

## 2018-01-07 NOTE — Assessment & Plan Note (Signed)
Symptoms somewhat concerning for vertigo.  Has persisted to some degree and also has some balance issues that may or may not have been going on prior to the event she had 5 days ago.  Given her persistent symptoms and her risk factors for stroke I think she warrants evaluation with MRI without stroke.  If negative she will see cardiology tomorrow as planned and if their evaluation is unremarkable we could have her see neurology.  She is given return precautions.

## 2018-01-07 NOTE — Progress Notes (Signed)
Patient states she does not remember when her last one was but she does not want one at this time, she states she will call back after all this is figured out first to schedule a mammogram

## 2018-01-07 NOTE — Assessment & Plan Note (Signed)
Repeat done today.  If ill-defined densities remain consider CT chest.

## 2018-01-07 NOTE — Assessment & Plan Note (Signed)
Evaluated in the emergency department.  No recurrence of these symptoms.  Concerning for cardiac cause given risk factors.  She will see cardiology tomorrow.  Given return precautions.

## 2018-01-07 NOTE — Patient Instructions (Signed)
Nice to see you. We will get a scan of your head to rule out an intracranial cause of your symptoms. If you have acute changes or you develop chest pain, shortness of breath, worsening dizziness, numbness, weakness, vision changes, or any new or changing symptoms please seek medical attention.

## 2018-01-08 DIAGNOSIS — I10 Essential (primary) hypertension: Secondary | ICD-10-CM | POA: Insufficient documentation

## 2018-01-08 DIAGNOSIS — I208 Other forms of angina pectoris: Secondary | ICD-10-CM | POA: Diagnosis not present

## 2018-01-09 ENCOUNTER — Other Ambulatory Visit: Payer: Self-pay | Admitting: Family Medicine

## 2018-01-09 DIAGNOSIS — R079 Chest pain, unspecified: Secondary | ICD-10-CM

## 2018-01-15 ENCOUNTER — Other Ambulatory Visit (INDEPENDENT_AMBULATORY_CARE_PROVIDER_SITE_OTHER): Payer: PPO

## 2018-01-15 DIAGNOSIS — R079 Chest pain, unspecified: Secondary | ICD-10-CM | POA: Diagnosis not present

## 2018-01-15 LAB — LIPID PANEL
CHOLESTEROL: 201 mg/dL — AB (ref 0–200)
HDL: 47 mg/dL (ref >39.00)
LDL Cholesterol: 133 mg/dL — ABNORMAL HIGH (ref 0–99)
NONHDL: 153.81
Total CHOL/HDL Ratio: 4
Triglycerides: 103 mg/dL (ref 0.0–149.0)
VLDL: 20.6 mg/dL (ref 0.0–40.0)

## 2018-01-18 NOTE — Progress Notes (Signed)
The 10-year ASCVD risk score Mikey Bussing DC Brooke Bonito., et al., 2013) is: 35.8%   Values used to calculate the score:     Age: 82 years     Sex: Female     Is Non-Hispanic African American: No     Diabetic: Yes     Tobacco smoker: No     Systolic Blood Pressure: 937 mmHg     Is BP treated: Yes     HDL Cholesterol: 47 mg/dL     Total Cholesterol: 201 mg/dL

## 2018-01-21 DIAGNOSIS — I208 Other forms of angina pectoris: Secondary | ICD-10-CM | POA: Diagnosis not present

## 2018-01-21 DIAGNOSIS — R0789 Other chest pain: Secondary | ICD-10-CM | POA: Diagnosis not present

## 2018-01-21 DIAGNOSIS — I10 Essential (primary) hypertension: Secondary | ICD-10-CM | POA: Diagnosis not present

## 2018-01-22 ENCOUNTER — Telehealth: Payer: Self-pay | Admitting: Family Medicine

## 2018-01-22 DIAGNOSIS — E785 Hyperlipidemia, unspecified: Secondary | ICD-10-CM

## 2018-01-22 MED ORDER — ATORVASTATIN CALCIUM 20 MG PO TABS: 20.0000 mg | ORAL_TABLET | Freq: Every day | ORAL | 3 refills | Status: DC

## 2018-01-22 NOTE — Telephone Encounter (Signed)
Left a message to return call, ok for pec to schedule non fasting lab appointment in 1 month

## 2018-01-22 NOTE — Telephone Encounter (Signed)
Please advise 

## 2018-01-22 NOTE — Telephone Encounter (Signed)
Pt aware to make fasting lab appointment and pt has been scheduled.

## 2018-01-22 NOTE — Telephone Encounter (Signed)
Copied from [place] [id number]. Topic: Quick Communication - Rx Refill/Question >> Jan 22, 2018  9:53 AM Margot Ables wrote: Medication: cholesterol med - pt states she received call about her lab work and no meds have been sent to pharmacy Has the patient contacted their pharmacy? Yes.  Does not have it Preferred Pharmacy (with phone number or street name): [facility] [place], [place] - [place] [phone] (Phone) [phone] (Fax)

## 2018-01-22 NOTE — Telephone Encounter (Signed)
Pt is calling to check on the status of this. Please advise.

## 2018-01-22 NOTE — Telephone Encounter (Signed)
Sent to pharmacy.  Please get her set up for repeat lab work in 1 month.  Thanks.

## 2018-01-23 ENCOUNTER — Other Ambulatory Visit: Payer: Self-pay | Admitting: Family Medicine

## 2018-02-20 ENCOUNTER — Other Ambulatory Visit (INDEPENDENT_AMBULATORY_CARE_PROVIDER_SITE_OTHER): Payer: PPO

## 2018-02-20 DIAGNOSIS — E785 Hyperlipidemia, unspecified: Secondary | ICD-10-CM

## 2018-02-20 LAB — HEPATIC FUNCTION PANEL
ALK PHOS: 68 U/L (ref 39–117)
ALT: 13 U/L (ref 0–35)
AST: 16 U/L (ref 0–37)
Albumin: 4.3 g/dL (ref 3.5–5.2)
BILIRUBIN DIRECT: 0.2 mg/dL (ref 0.0–0.3)
TOTAL PROTEIN: 6.9 g/dL (ref 6.0–8.3)
Total Bilirubin: 0.3 mg/dL (ref 0.2–1.2)

## 2018-02-20 LAB — LDL CHOLESTEROL, DIRECT: Direct LDL: 68 mg/dL

## 2018-02-23 ENCOUNTER — Encounter: Payer: Self-pay | Admitting: *Deleted

## 2018-02-26 ENCOUNTER — Other Ambulatory Visit: Payer: Self-pay | Admitting: Family Medicine

## 2018-02-27 ENCOUNTER — Telehealth: Payer: Self-pay

## 2018-02-27 NOTE — Telephone Encounter (Signed)
Was able to speak with patient. I informed her that the office did try to contact her but was unable and that her labs were mailed. Patient states that she has not received those labs in the mail and would like to pick them up. Labs have been printed and are  Upfront for patient to pick up. PF  Copied from [place]. Topic: General - Other >> Feb 27, 2018  9:51 AM Oneta Rack wrote: Relation to pt: self  Call back [phone]   Reason for call:  Patient would like to pick up 01/15/18 and 02/20/18 lab results today, please advise  >> Feb 27, 2018 10:02 AM Oneta Rack wrote: Relation to pt: self  Call back [phone]   Reason for call:  Patient would like to pick up 01/15/18 and 02/20/18 lab results today, please advise

## 2018-03-10 NOTE — Telephone Encounter (Signed)
Patient is calling and states she was told that her lab results were mailed on 02/23/18. She came to the office and picked them up but wanted to let the office know that she never received them in the mail.

## 2018-03-10 NOTE — Telephone Encounter (Signed)
Routed to wrong practice. Sent to [employer].

## 2018-03-10 NOTE — Telephone Encounter (Signed)
Did you speak to this patient?

## 2018-03-13 NOTE — Telephone Encounter (Signed)
I called and left this patient a VM on 03/10/2018.  Called patient again today @ 4:05pm and left another VM to let patient know she could call back if she still has questions regarding her labs.  Patient came in to the office and Juliann Pulse reviewed her labs with her and she had no further questions.  I do not know why she did not receive her labs as they were sent on 3/18 to the address on file [address] [place] [address].  If patient calls back we can speak with her, however, it will be to reiterate the above information.  If she has any questions regarding the receipt of her mail she can contact the post office.

## 2018-03-13 NOTE — Telephone Encounter (Signed)
Patient called back and I spoke with her after she spoke with Buddy Duty.  Patient stated that she was told that we didn't not call her.  I explained that I had not finished documenting that I had called so the person she spoke with didn't know I had called.  Patient explained she would not be back as a patient.

## 2018-03-16 ENCOUNTER — Ambulatory Visit (INDEPENDENT_AMBULATORY_CARE_PROVIDER_SITE_OTHER): Payer: PPO | Admitting: Nurse Practitioner

## 2018-03-16 ENCOUNTER — Encounter: Payer: Self-pay | Admitting: Nurse Practitioner

## 2018-03-16 VITALS — BP 103/54 | HR 60 | Resp 16 | Ht 67.5 in | Wt 147.2 lb

## 2018-03-16 DIAGNOSIS — N3000 Acute cystitis without hematuria: Secondary | ICD-10-CM | POA: Diagnosis not present

## 2018-03-16 DIAGNOSIS — M170 Bilateral primary osteoarthritis of knee: Secondary | ICD-10-CM

## 2018-03-16 DIAGNOSIS — I10 Essential (primary) hypertension: Secondary | ICD-10-CM | POA: Diagnosis not present

## 2018-03-16 NOTE — Progress Notes (Signed)
[facility] [place], [place] [address]  Internal MEDICINE  Office Visit Note  Patient Name: Sandra Davidson  [id number]  [id number]  Date of Service: 04/08/2018  Chief Complaint  Patient presents with  . Urinary Tract Infection    Urinary Tract Infection   This is a recurrent problem. The current episode started in the past 7 days. The problem occurs every urination. The problem has been gradually worsening. The quality of the pain is described as aching. The pain is at a severity of 3/10. The pain is moderate. There has been no fever. She is not sexually active. There is no history of pyelonephritis. Associated symptoms include chills, flank pain, frequency, nausea, sweats, urgency and vomiting. She has tried antibiotics for the symptoms. The treatment provided mild relief. Her past medical history is significant for recurrent UTIs.      Current Medication: Outpatient Encounter Medications as of 03/16/2018  Medication Sig Note  . ALPRAZolam (XANAX) 0.5 MG tablet TAKE 1 TABLET BY MOUTH DAILY AS NEEDED FOR ANXIETY   . amLODipine-benazepril (LOTREL) 10-20 MG capsule Take 1 capsule by mouth daily.   Marland Kitchen atorvastatin (LIPITOR) 20 MG tablet Take 1 tablet (20 mg total) by mouth daily. 03/16/2018: Patient to take 1/2 tablet daily.   Marland Kitchen etodolac (LODINE) 400 MG tablet TAKE 1 TABLET BY MOUTH ONCE A DAY   . furosemide (LASIX) 20 MG tablet Take 1 tablet (20 mg total) by mouth daily.   Marland Kitchen ibuprofen (ADVIL,MOTRIN) 600 MG tablet TAKE 1 TABLET BY MOUTH EVERY 8 HOURS AS NEEDED   . NONFORMULARY OR COMPOUNDED ITEM See pharmacy note   . potassium chloride (K-DUR) 10 MEQ tablet TAKE 1 TABLET BY MOUTH ONCE DAILY IF YOUTAKE FLUID PILL   . [DISCONTINUED] ciprofloxacin (CIPRO) 500 MG tablet Take 1 tablet (500 mg total) by mouth 2 (two) times daily.   . [DISCONTINUED] gabapentin (NEURONTIN) 100 MG capsule Take 1 capsule (100 mg total) by mouth at bedtime.    No facility-administered  encounter medications on file as of 03/16/2018.     Surgical History: Past Surgical History:  Procedure Laterality Date  . BACK SURGERY    . MOHS SURGERY    . NECK SURGERY    . TONSILLECTOMY      Medical History: Past Medical History:  Diagnosis Date  . Allergy   . Anxiety   . Arthritis   . Cancer ([place])    skin  . Depression   . Diabetes mellitus without complication ([place])   . GERD (gastroesophageal reflux disease)   . Hypertension   . Migraines     Family History: Family History  Problem Relation Age of Onset  . Congestive Heart Failure Mother   . COPD Mother   . Emphysema Mother   . Hypertension Mother   . Kidney disease Mother   . Cancer Father   . Kidney disease Father     Social History   Socioeconomic History  . Marital status: Divorced    Spouse name: Not on file  . Number of children: Not on file  . Years of education: Not on file  . Highest education level: Not on file  Occupational History  . Not on file  Social Needs  . Financial resource strain: Not on file  . Food insecurity:    Worry: Not on file    Inability: Not on file  . Transportation needs:    Medical: Not on file    Non-medical: Not on file  Tobacco Use  . Smoking status: Former Research scientist (life sciences)  . Smokeless tobacco: Never Used  Substance and Sexual Activity  . Alcohol use: No  . Drug use: No  . Sexual activity: Not Currently  Lifestyle  . Physical activity:    Days per week: Not on file    Minutes per session: Not on file  . Stress: Not on file  Relationships  . Social connections:    Talks on phone: Not on file    Gets together: Not on file    Attends religious service: Not on file    Active member of club or organization: Not on file    Attends meetings of clubs or organizations: Not on file    Relationship status: Not on file  . Intimate partner violence:    Fear of current or ex partner: Not on file    Emotionally abused: Not on file    Physically abused: Not on file     Forced sexual activity: Not on file  Other Topics Concern  . Not on file  Social History Narrative  . Not on file      Review of Systems  Constitutional: Positive for activity change, chills and fatigue. Negative for fever.  HENT: Negative for congestion, postnasal drip, rhinorrhea, sinus pain, sore throat and voice change.   Eyes: Negative.   Respiratory: Positive for shortness of breath. Negative for cough and wheezing.   Cardiovascular: Negative for chest pain and palpitations.  Gastrointestinal: Positive for nausea and vomiting.  Genitourinary: Positive for dysuria, flank pain, frequency, pelvic pain and urgency.       Symptoms have improved since she was treated for UTI   Musculoskeletal: Positive for back pain and myalgias.  Allergic/Immunologic: Negative for environmental allergies and food allergies.  Neurological: Positive for headaches. Negative for seizures and weakness.  Hematological: Negative for adenopathy.  Psychiatric/Behavioral: The patient is nervous/anxious.     Today's Vitals   03/16/18 1444  BP: (!) 103/54  Pulse: 60  Resp: 16  SpO2: 96%  Weight: 147 lb 3.2 oz (66.8 kg)  Height: 5' 7.5" (1.715 m)    Physical Exam  Constitutional: She is oriented to person, place, and time. She appears well-developed and well-nourished. No distress.  HENT:  Head: Normocephalic and atraumatic.  Mouth/Throat: Oropharynx is clear and moist. No oropharyngeal exudate.  Eyes: Pupils are equal, round, and reactive to light. EOM are normal.  Neck: Normal range of motion. Neck supple. No JVD present. Carotid bruit is not present. No tracheal deviation present. No thyromegaly present.  Cardiovascular: Normal rate, regular rhythm and normal heart sounds. Exam reveals no gallop and no friction rub.  No murmur heard. Pulmonary/Chest: Effort normal and breath sounds normal. No respiratory distress. She has no wheezes. She has no rales. She exhibits no tenderness.  Abdominal: Soft.  Bowel sounds are normal. There is no tenderness.  Musculoskeletal: Normal range of motion.  Lymphadenopathy:    She has no cervical adenopathy.  Neurological: She is alert and oriented to person, place, and time. No cranial nerve deficit.  Skin: Skin is warm and dry. She is not diaphoretic.  Psychiatric: She has a normal mood and affect. Her behavior is normal. Judgment and thought content normal.  Nursing note and vitals reviewed.   Assessment/Plan: 1. Essential hypertension Currently stable. Continue BP medications as prescribed   2. Acute cystitis without hematuria Resolved since treatment prior to this visit.   3. Primary osteoarthritis of both knees Continue to alternate etodolac and ibuprofen  for pain/inflammation.   General Counseling: Sandra Davidson verbalizes understanding of the findings of todays visit and agrees with plan of treatment. I have discussed any further diagnostic evaluation that may be needed or ordered today. We also reviewed her medications today. she has been encouraged to call the office with any questions or concerns that should arise related to todays visit.   This patient was seen by Leretha Pol, FNP- C in Collaboration with Dr Lavera Guise as a part of collaborative care agreement   Time spent: 51  Minutes     Dr Lavera Guise Internal medicine

## 2018-04-02 ENCOUNTER — Encounter: Payer: Self-pay | Admitting: Nurse Practitioner

## 2018-04-02 ENCOUNTER — Ambulatory Visit (INDEPENDENT_AMBULATORY_CARE_PROVIDER_SITE_OTHER): Payer: PPO | Admitting: Nurse Practitioner

## 2018-04-02 VITALS — BP 103/48 | HR 63 | Resp 16 | Ht 67.5 in | Wt 141.0 lb

## 2018-04-02 DIAGNOSIS — G2581 Restless legs syndrome: Secondary | ICD-10-CM

## 2018-04-02 DIAGNOSIS — N390 Urinary tract infection, site not specified: Secondary | ICD-10-CM | POA: Diagnosis not present

## 2018-04-02 DIAGNOSIS — I10 Essential (primary) hypertension: Secondary | ICD-10-CM

## 2018-04-02 DIAGNOSIS — R300 Dysuria: Secondary | ICD-10-CM

## 2018-04-02 LAB — POCT URINALYSIS DIPSTICK
Glucose, UA: NEGATIVE
Ketones, UA: NEGATIVE
Nitrite, UA: NEGATIVE
Protein, UA: NEGATIVE
Spec Grav, UA: 1.01 (ref 1.010–1.025)
UROBILINOGEN UA: 0.2 U/dL
pH, UA: 6 (ref 5.0–8.0)

## 2018-04-02 MED ORDER — ROPINIROLE HCL 0.5 MG PO TABS: 0.5000 mg | ORAL_TABLET | Freq: Every day | ORAL | 3 refills | Status: DC

## 2018-04-02 MED ORDER — DOXYCYCLINE HYCLATE 100 MG PO TABS: 100.0000 mg | ORAL_TABLET | Freq: Two times a day (BID) | ORAL | 0 refills | Status: DC

## 2018-04-02 NOTE — Progress Notes (Signed)
[facility], [place] [address]  Internal MEDICINE  Office Visit Note  Patient Name: Sandra Davidson  [id number]  [id number]  Date of Service: 04/22/2018   Pt is here for a sick visit.   Chief Complaint  Patient presents with  . Urinary Tract Infection    possible UTI for about two weeks,   . Anorexia    have had a loss of appetite for past month. has lost 6lbs.  . Diarrhea    possibly coming from gabopentin  . Back Pain    when going to the bathroom urination is just dribble. anytime eat or drink anything she having to go to the bathroom.  . Chills    little chills and weakness due to diarrhea and not able to eat and dizzy     The patient was treated earlier this month for UTI. Treated with cipro bid for 10 days. Symptoms were relieved. Restarted over the past few days. Gradually getting worse. She is having severe nausea and diarrhea. Her appetite is gone and she is having trouble even tolerating water.  She needs refill for her ropinirole.   Urinary Tract Infection   This is a recurrent problem. The current episode started in the past 7 days. The problem occurs every urination. The problem has been gradually worsening. The quality of the pain is described as burning. The pain is at a severity of 4/10. The pain is moderate. There has been no fever. She is not sexually active. There is no history of pyelonephritis. Associated symptoms include chills, flank pain, frequency, hematuria, nausea and urgency. Pertinent negatives include no vomiting. She has tried antibiotics for the symptoms. The treatment provided moderate relief. Her past medical history is significant for recurrent UTIs.        Current Medication:  Outpatient Encounter Medications as of 04/02/2018  Medication Sig Note  . amLODipine-benazepril (LOTREL) 10-20 MG capsule Take 1 capsule by mouth daily.   Marland Kitchen atorvastatin (LIPITOR) 20 MG tablet Take 1 tablet (20 mg total) by mouth  daily. 03/16/2018: Patient to take 1/2 tablet daily.   Marland Kitchen etodolac (LODINE) 400 MG tablet TAKE 1 TABLET BY MOUTH ONCE A DAY   . furosemide (LASIX) 20 MG tablet Take 1 tablet (20 mg total) by mouth daily.   Marland Kitchen ibuprofen (ADVIL,MOTRIN) 600 MG tablet TAKE 1 TABLET BY MOUTH EVERY 8 HOURS AS NEEDED   . NONFORMULARY OR COMPOUNDED ITEM See pharmacy note   . potassium chloride (K-DUR) 10 MEQ tablet TAKE 1 TABLET BY MOUTH ONCE DAILY IF YOUTAKE FLUID PILL   . [DISCONTINUED] ALPRAZolam (XANAX) 0.5 MG tablet TAKE 1 TABLET BY MOUTH DAILY AS NEEDED FOR ANXIETY   . [DISCONTINUED] gabapentin (NEURONTIN) 100 MG capsule Take 1 capsule (100 mg total) by mouth at bedtime.   Marland Kitchen rOPINIRole (REQUIP) 0.5 MG tablet Take 1 tablet (0.5 mg total) by mouth at bedtime.   . [DISCONTINUED] doxycycline (VIBRA-TABS) 100 MG tablet Take 1 tablet (100 mg total) by mouth 2 (two) times daily.    No facility-administered encounter medications on file as of 04/02/2018.       Medical History: Past Medical History:  Diagnosis Date  . Allergy   . Anxiety   . Arthritis   . Cancer ([place])    skin  . Depression   . Diabetes mellitus without complication ([place])   . GERD (gastroesophageal reflux disease)   . Hypertension   . Migraines      Today's Vitals  04/02/18 1042  BP: (!) 103/48  Pulse: 63  Resp: 16  SpO2: 99%  Weight: 141 lb (64 kg)  Height: 5' 7.5" (1.715 m)    Review of Systems  Constitutional: Positive for activity change, appetite change, chills, diaphoresis and fatigue. Negative for fever and unexpected weight change.  HENT: Negative for congestion, postnasal drip, rhinorrhea, sneezing and sore throat.   Eyes: Negative.  Negative for redness.  Respiratory: Negative for cough, chest tightness, shortness of breath and wheezing.   Cardiovascular: Negative for chest pain and palpitations.  Gastrointestinal: Positive for abdominal pain, diarrhea and nausea. Negative for constipation and vomiting.  Endocrine: Negative  for cold intolerance, heat intolerance, polydipsia, polyphagia and polyuria.  Genitourinary: Positive for flank pain, frequency, hematuria and urgency. Negative for dysuria.  Musculoskeletal: Positive for back pain. Negative for arthralgias, joint swelling and neck pain.  Skin: Negative for rash.  Allergic/Immunologic: Negative for environmental allergies.  Neurological: Positive for headaches. Negative for tremors and numbness.  Hematological: Negative for adenopathy. Does not bruise/bleed easily.  Psychiatric/Behavioral: Negative for behavioral problems (Depression), sleep disturbance and suicidal ideas. The patient is nervous/anxious.     Physical Exam  Constitutional: She is oriented to person, place, and time. She appears well-developed and well-nourished. She appears ill. No distress.  HENT:  Head: Normocephalic and atraumatic.  Mouth/Throat: Oropharynx is clear and moist. No oropharyngeal exudate.  Eyes: Pupils are equal, round, and reactive to light. Conjunctivae and EOM are normal.  Neck: Normal range of motion. Neck supple. No JVD present. No tracheal deviation present. No thyromegaly present.  Cardiovascular: Normal rate, regular rhythm and normal heart sounds. Exam reveals no gallop and no friction rub.  No murmur heard. Pulmonary/Chest: Effort normal and breath sounds normal. No respiratory distress. She has no wheezes. She has no rales. She exhibits no tenderness.  Abdominal: Soft. Bowel sounds are normal. There is tenderness.  Genitourinary:  Genitourinary Comments: Urine sample positive for large WBC and moderate blood  Musculoskeletal: Normal range of motion.  Lymphadenopathy:    She has no cervical adenopathy.  Neurological: She is alert and oriented to person, place, and time. No cranial nerve deficit.  Skin: Skin is warm and dry. She is not diaphoretic.  Psychiatric: She has a normal mood and affect. Her behavior is normal. Judgment and thought content normal.   Nursing note and vitals reviewed.  Assessment/Plan: 1. Urinary tract infection without hematuria, site unspecified Start doxycycline 100mg  twice daily for 10 days. Send sample for culture and sensitivity and adjust antibiotics as indicated. Advised patient in increase water intake as tolerated.  - CULTURE, URINE COMPREHENSIVE  2. Dysuria - POCT Urinalysis Dipstick positive for large WBC and moderate blood. Treat as above.  - CULTURE, URINE COMPREHENSIVE  3. Restless leg syndrome, controlled Continue requip at night.  - rOPINIRole (REQUIP) 0.5 MG tablet; Take 1 tablet (0.5 mg total) by mouth at bedtime.  Dispense: 30 tablet; Refill: 3  4. Benign essential HTN conitnue bp medication as prescribed.   General Counseling: Alessia verbalizes understanding of the findings of todays visit and agrees with plan of treatment. I have discussed any further diagnostic evaluation that may be needed or ordered today. We also reviewed her medications today. she has been encouraged to call the office with any questions or concerns that should arise related to todays visit.  Rest and increase fluids. Continue using OTC medication to control symptoms.   This patient was seen by Leretha Pol, FNP- C in Collaboration with Dr Timoteo Gaul  Humphrey Rolls as a part of collaborative care agreement   Orders Placed This Encounter  Procedures  . CULTURE, URINE COMPREHENSIVE  . POCT Urinalysis Dipstick    Meds ordered this encounter  Medications  . rOPINIRole (REQUIP) 0.5 MG tablet    Sig: Take 1 tablet (0.5 mg total) by mouth at bedtime.    Dispense:  30 tablet    Refill:  3    Order Specific Question:   Supervising Provider    Answer:   Lavera Guise [[id number]]  . DISCONTD: doxycycline (VIBRA-TABS) 100 MG tablet    Sig: Take 1 tablet (100 mg total) by mouth 2 (two) times daily.    Dispense:  20 tablet    Refill:  0    Order Specific Question:   Supervising Provider    Answer:   Lavera Guise [[id number]]    Time spent: 15  Minutes

## 2018-04-06 ENCOUNTER — Other Ambulatory Visit: Payer: Self-pay

## 2018-04-06 ENCOUNTER — Emergency Department
Admission: EM | Admit: 2018-04-06 | Discharge: 2018-04-06 | Disposition: A | Discharge status: Home / Self Care | Payer: PPO | Attending: Emergency Medicine | Admitting: Emergency Medicine

## 2018-04-06 DIAGNOSIS — F419 Anxiety disorder, unspecified: Secondary | ICD-10-CM | POA: Insufficient documentation

## 2018-04-06 DIAGNOSIS — E860 Dehydration: Secondary | ICD-10-CM | POA: Diagnosis not present

## 2018-04-06 DIAGNOSIS — R112 Nausea with vomiting, unspecified: Secondary | ICD-10-CM | POA: Diagnosis present

## 2018-04-06 DIAGNOSIS — Z85828 Personal history of other malignant neoplasm of skin: Secondary | ICD-10-CM | POA: Diagnosis not present

## 2018-04-06 DIAGNOSIS — N390 Urinary tract infection, site not specified: Secondary | ICD-10-CM | POA: Insufficient documentation

## 2018-04-06 DIAGNOSIS — Z87891 Personal history of nicotine dependence: Secondary | ICD-10-CM | POA: Insufficient documentation

## 2018-04-06 DIAGNOSIS — F329 Major depressive disorder, single episode, unspecified: Secondary | ICD-10-CM | POA: Insufficient documentation

## 2018-04-06 DIAGNOSIS — I10 Essential (primary) hypertension: Secondary | ICD-10-CM | POA: Diagnosis not present

## 2018-04-06 DIAGNOSIS — E119 Type 2 diabetes mellitus without complications: Secondary | ICD-10-CM | POA: Diagnosis not present

## 2018-04-06 DIAGNOSIS — Z79899 Other long term (current) drug therapy: Secondary | ICD-10-CM | POA: Insufficient documentation

## 2018-04-06 LAB — URINALYSIS, COMPLETE (UACMP) WITH MICROSCOPIC
BACTERIA UA: NONE SEEN
BILIRUBIN URINE: NEGATIVE
Glucose, UA: NEGATIVE mg/dL
Ketones, ur: NEGATIVE mg/dL
Nitrite: NEGATIVE
Protein, ur: NEGATIVE mg/dL
SPECIFIC GRAVITY, URINE: 1.012 (ref 1.005–1.030)
pH: 5 (ref 5.0–8.0)

## 2018-04-06 LAB — CBC
HEMATOCRIT: 40.8 % (ref 35.0–47.0)
Hemoglobin: 14.1 g/dL (ref 12.0–16.0)
MCH: 32.1 pg (ref 26.0–34.0)
MCHC: 34.6 g/dL (ref 32.0–36.0)
MCV: 92.8 fL (ref 80.0–100.0)
PLATELETS: 229 10*3/uL (ref 150–440)
RBC: 4.39 MIL/uL (ref 3.80–5.20)
RDW: 13.1 % (ref 11.5–14.5)
WBC: 6.9 10*3/uL (ref 3.6–11.0)

## 2018-04-06 LAB — COMPREHENSIVE METABOLIC PANEL
ALT: 9 U/L — ABNORMAL LOW (ref 14–54)
AST: 16 U/L (ref 15–41)
Albumin: 4.3 g/dL (ref 3.5–5.0)
Alkaline Phosphatase: 67 U/L (ref 38–126)
Anion gap: 10 (ref 5–15)
BUN: 31 mg/dL — ABNORMAL HIGH (ref 6–20)
CHLORIDE: 105 mmol/L (ref 101–111)
CO2: 22 mmol/L (ref 22–32)
Calcium: 9.3 mg/dL (ref 8.9–10.3)
Creatinine, Ser: 1.36 mg/dL — ABNORMAL HIGH (ref 0.44–1.00)
GFR, EST AFRICAN AMERICAN: 42 mL/min — AB (ref >60)
GFR, EST NON AFRICAN AMERICAN: 36 mL/min — AB (ref >60)
Glucose, Bld: 120 mg/dL — ABNORMAL HIGH (ref 65–99)
Potassium: 4.6 mmol/L (ref 3.5–5.1)
Sodium: 137 mmol/L (ref 135–145)
Total Bilirubin: 0.6 mg/dL (ref 0.3–1.2)
Total Protein: 7.6 g/dL (ref 6.5–8.1)

## 2018-04-06 LAB — LIPASE, BLOOD: LIPASE: 21 U/L (ref 11–51)

## 2018-04-06 MED ORDER — CEPHALEXIN 500 MG PO CAPS
500.0000 mg | ORAL_CAPSULE | Freq: Once | ORAL | Status: AC
  Administered 2018-04-06: 500 mg via ORAL
  Filled 2018-04-06: qty 1

## 2018-04-06 MED ORDER — ONDANSETRON 4 MG PO TBDP
4.0000 mg | ORAL_TABLET | Freq: Once | ORAL | Status: AC | PRN
  Administered 2018-04-06: 4 mg via ORAL

## 2018-04-06 MED ORDER — ONDANSETRON 4 MG PO TBDP
ORAL_TABLET | ORAL | Status: AC
  Filled 2018-04-06: qty 1

## 2018-04-06 MED ORDER — SODIUM CHLORIDE 0.9 % IV BOLUS
1000.0000 mL | Freq: Once | INTRAVENOUS | Status: AC
  Administered 2018-04-06: 1000 mL via INTRAVENOUS

## 2018-04-06 MED ORDER — ONDANSETRON HCL 4 MG PO TABS: 4.0000 mg | ORAL_TABLET | Freq: Three times a day (TID) | ORAL | 1 refills | Status: AC | PRN

## 2018-04-06 MED ORDER — CEPHALEXIN 500 MG PO CAPS: 500.0000 mg | ORAL_CAPSULE | Freq: Two times a day (BID) | ORAL | 0 refills | Status: AC

## 2018-04-06 NOTE — ED Notes (Signed)
ED Provider at bedside. 

## 2018-04-06 NOTE — ED Provider Notes (Signed)
[facility] Emergency Department Provider Note ____________________________________________   First MD Initiated Contact with Patient 04/06/18 1451     (approximate)  I have reviewed the triage vital signs and the nursing notes.   HISTORY  Chief Complaint Emesis and Abdominal Pain    HPI Sandra Davidson is a 82 y.o. female with PMH as noted below who presents with nausea and vomiting, decreased p.o. intake, generalized weakness occurring for the last few weeks but worse in the last several days.  It is associated with bilateral flank pain, but no abdominal pain or diarrhea.  The patient states that she was initially diagnosed with a urinary tract infection in January and treated with Cipro for 1 week.  She states that she never really felt like she returned to her baseline.  Last week she was seen by her doctor and was told she had a persistent UTI, and started on doxycycline.  She has been taking it for the last 4 days.  Patient states in the last 2 to 3 days the symptoms have worsened and she has been feeling weaker.   Past Medical History:  Diagnosis Date  . Allergy   . Anxiety   . Arthritis   . Cancer ([place])    skin  . Depression   . Diabetes mellitus without complication ([place])   . GERD (gastroesophageal reflux disease)   . Hypertension   . Migraines     Patient Active Problem List   Diagnosis Date Noted  . Benign essential HTN 01/08/2018  . Vertigo 01/07/2018  . Chest pain 01/07/2018  . UTI (urinary tract infection) 01/07/2018  . Abnormal chest x-ray 01/07/2018  . Prediabetes 05/16/2017  . Onychomycosis 05/16/2017  . Hypertension 02/13/2017  . Skin cancer 02/13/2017  . Anxiety and depression 02/13/2017  . Osteoarthritis 02/13/2017  . Chronic venous insufficiency 09/26/2016  . Lumbar back pain 09/26/2016  . Neuropathy 09/26/2016    Past Surgical History:  Procedure Laterality Date  . BACK SURGERY    . MOHS SURGERY    . NECK SURGERY     . TONSILLECTOMY      Prior to Admission medications   Medication Sig Start Date End Date Taking? Authorizing Provider  ALPRAZolam Duanne Moron) 0.5 MG tablet TAKE 1 TABLET BY MOUTH DAILY AS NEEDED FOR ANXIETY 11/26/17   Leone Haven, MD  amLODipine-benazepril (LOTREL) 10-20 MG capsule Take 1 capsule by mouth daily. 11/26/17   Leone Haven, MD  atorvastatin (LIPITOR) 20 MG tablet Take 1 tablet (20 mg total) by mouth daily. 01/22/18   Leone Haven, MD  doxycycline (VIBRA-TABS) 100 MG tablet Take 1 tablet (100 mg total) by mouth 2 (two) times daily. 04/02/18   Ronnell Freshwater, NP  etodolac (LODINE) 400 MG tablet TAKE 1 TABLET BY MOUTH ONCE A DAY 02/26/18   Leone Haven, MD  furosemide (LASIX) 20 MG tablet Take 1 tablet (20 mg total) by mouth daily. 11/26/17   Leone Haven, MD  ibuprofen (ADVIL,MOTRIN) 600 MG tablet TAKE 1 TABLET BY MOUTH EVERY 8 HOURS AS NEEDED 05/29/17   Leone Haven, MD  NONFORMULARY OR COMPOUNDED ITEM See pharmacy note 10/17/17   Edrick Kins, DPM  potassium chloride (K-DUR) 10 MEQ tablet TAKE 1 TABLET BY MOUTH ONCE DAILY IF YOUTAKE FLUID PILL 11/26/17   Leone Haven, MD  rOPINIRole (REQUIP) 0.5 MG tablet Take 1 tablet (0.5 mg total) by mouth at bedtime. 04/02/18   Ronnell Freshwater, NP  Allergies Codeine; Demerol [meperidine]; Penicillins; and Sulfa antibiotics  Family History  Problem Relation Age of Onset  . Congestive Heart Failure Mother   . COPD Mother   . Emphysema Mother   . Hypertension Mother   . Kidney disease Mother   . Cancer Father   . Kidney disease Father     Social History Social History   Tobacco Use  . Smoking status: Former Research scientist (life sciences)  . Smokeless tobacco: Never Used  Substance Use Topics  . Alcohol use: No  . Drug use: No    Review of Systems  Constitutional: No fever/chills.  Positive for generalized weakness. Eyes: No redness. ENT: No sore throat. Cardiovascular: Denies chest pain. Respiratory:  Denies shortness of breath. Gastrointestinal: No naus positive for nausea and vomiting.  Genitourinary: Negative for dysuria or hematuria.  Musculoskeletal: Positive e for back pain. Skin: Negative for rash. Neurological: Negative for headache.   ____________________________________________   PHYSICAL EXAM:  VITAL SIGNS: ED Triage Vitals  Enc Vitals Group     BP 04/06/18 1324 (!) 114/52     Pulse Rate 04/06/18 1324 66     Resp 04/06/18 1324 17     Temp 04/06/18 1324 97.8 F (36.6 C)     Temp Source 04/06/18 1324 Oral     SpO2 04/06/18 1324 99 %     Weight 04/06/18 1325 140 lb (63.5 kg)     Height 04/06/18 1325 5\' 7"  (1.702 m)     Head Circumference --      Peak Flow --      Pain Score 04/06/18 1324 9     Pain Loc --      Pain Edu? --      Excl. in [place]? --     Constitutional: Alert and oriented.  Relatively well appearing and in no acute distress. Eyes: Conjunctivae are normal.  Head: Atraumatic. Nose: No congestion/rhinnorhea. Mouth/Throat: Mucous membranes are slightly dry t.   Neck: Normal range of motion.  Cardiovascular: Normal rate, regular rhythm. Grossly normal heart sounds.  Good peripheral circulation. Respiratory: Normal respiratory effort.  No retractions. Lungs CTAB. Gastrointestinal: Soft and nontender. No distention.  Genitourinary: No CVA tenderness. Musculoskeletal:  Extremities warm and well perfused.  Neurologic:  Normal speech and language. No gross focal neurologic deficits are appreciated.  Skin:  Skin is warm and dry. No rash noted. Psychiatric: Mood and affect are normal. Speech and behavior are normal.  ____________________________________________   LABS (all labs ordered are listed, but only abnormal results are displayed)  Labs Reviewed  COMPREHENSIVE METABOLIC PANEL - Abnormal; Notable for the following components:      Result Value   Glucose, Bld 120 (*)    BUN 31 (*)    Creatinine, Ser 1.36 (*)    ALT 9 (*)    GFR calc non Af  Amer 36 (*)    GFR calc Af Amer 42 (*)    All other components within normal limits  URINALYSIS, COMPLETE (UACMP) WITH MICROSCOPIC - Abnormal; Notable for the following components:   Color, Urine YELLOW (*)    APPearance HAZY (*)    Hgb urine dipstick SMALL (*)    Leukocytes, UA TRACE (*)    All other components within normal limits  LIPASE, BLOOD  CBC   ____________________________________________  EKG   ____________________________________________  RADIOLOGY    ____________________________________________   PROCEDURES  Procedure(s) performed: No  Procedures  Critical Care performed: No ____________________________________________   INITIAL IMPRESSION / ASSESSMENT AND PLAN / ED  COURSE  Pertinent labs & imaging results that were available during my care of the patient were reviewed by me and considered in my medical decision making (see chart for details).  82 year old female with PMH as noted above presents with nausea, vomiting, decreased p.o. intake, and weakness in the context of current treatment for UTI with doxycycline.  Patient states she was initially treated for UTI in January with Cipro, but had some persistent symptoms and then last week was told she had a persistent UTI and started on doxycycline.  I reviewed available past medical records in Epic, including the note from the visit last week, however I was not able to ascertain the reason for the specific antibiotic choice, and patient's available urine cultures have no sensitivities listed.  I am presuming that given the patient's allergy to penicillin, her provider may want her to avoid cephalosporins.  On exam today, the patient's vital signs are normal, she is relatively well-appearing although mucous membranes are slightly dry.  Abdomen is soft and nontender.  Initial lab work-up is within normal limits except for minimally elevated creatinine which is consistent with mild dehydration. Patient's UA is  consistent with UTI.  Overall I suspect the patient is likely experiencing symptoms related to UTI as well as likely side effects of doxycycline which is well-known for GI symptoms.  Given the stable vital signs and reassuring labs, there is no indication for admission.  I will give the patient IV fluids here.  In addition, since the patient has no known cephalosporin allergy and believe she has taken Keflex in the past without a problem, I will give a dose of Keflex in the ED here.  The patient agrees with this plan.  As long as the patient has no reaction, I will switch the patient to p.o. Keflex for continued treatment of UTI at home and hopefully abate some of the GI symptoms.  Plan to reassess after fluids and p.o. Keflex.    ----------------------------------------- 5:51 PM on 04/06/2018 -----------------------------------------  Patient is feeling a bit better after fluids.  She would like to go home.  She tolerated the Keflex without any adverse reaction.  I will discharge her with a course of Keflex and I instructed her to discontinue the doxycycline.  I gave the patient thorough return precautions, and she expressed understanding.  ____________________________________________   FINAL CLINICAL IMPRESSION(S) / ED DIAGNOSES  Final diagnoses:  Urinary tract infection without hematuria, site unspecified  Dehydration      NEW MEDICATIONS STARTED DURING THIS VISIT:  New Prescriptions   No medications on file     Note:  This document was prepared using Dragon voice recognition software and may include unintentional dictation errors.    Arta Silence, MD 04/06/18 1752

## 2018-04-06 NOTE — ED Notes (Signed)
Pt ambulatory to wheel chair upon discharge. Verbalized understanding of discharge instructions, follow-up care and prescriptions. VSS. Skin warm and dry. A&O x4.   This RN waited with pt in lobby until daughter brought car around. Pt in NAD at this time and denying pain.

## 2018-04-06 NOTE — ED Triage Notes (Signed)
Pt c/o lower abd pain/cramping and has been on multiple antibiotics for UTI since January. States she is not able eat well due to the sx. States she is currently on doxycycline.

## 2018-04-06 NOTE — Discharge Instructions (Addendum)
Discontinue the doxycycline.  You should take the Keflex prescribed today and finish the full course.  You may use the Zofran prescribed today as needed for nausea.  Follow-up with your regular doctor within 1 to 2 weeks.  Return to the ER for new, worsening, or persistent weakness, vomiting, inability to take your medications, fever, abdominal or flank pain, or any other new or worsening symptoms that concern you.

## 2018-04-07 LAB — CULTURE, URINE COMPREHENSIVE

## 2018-04-21 ENCOUNTER — Other Ambulatory Visit: Payer: Self-pay | Admitting: Family Medicine

## 2018-04-21 ENCOUNTER — Encounter: Payer: Self-pay | Admitting: Internal Medicine

## 2018-04-21 ENCOUNTER — Ambulatory Visit (INDEPENDENT_AMBULATORY_CARE_PROVIDER_SITE_OTHER): Payer: PPO | Admitting: Internal Medicine

## 2018-04-21 VITALS — BP 108/68 | HR 61 | Temp 97.4°F | Resp 16 | Ht 67.5 in | Wt 140.2 lb

## 2018-04-21 DIAGNOSIS — N390 Urinary tract infection, site not specified: Secondary | ICD-10-CM | POA: Diagnosis not present

## 2018-04-21 DIAGNOSIS — R300 Dysuria: Secondary | ICD-10-CM

## 2018-04-21 DIAGNOSIS — Z87440 Personal history of urinary (tract) infections: Secondary | ICD-10-CM

## 2018-04-21 DIAGNOSIS — I10 Essential (primary) hypertension: Secondary | ICD-10-CM

## 2018-04-21 LAB — POCT URINALYSIS DIPSTICK
GLUCOSE UA: NEGATIVE
Ketones, UA: NEGATIVE
Nitrite, UA: NEGATIVE
Protein, UA: NEGATIVE
RBC UA: NEGATIVE
Spec Grav, UA: 1.015 (ref 1.010–1.025)
Urobilinogen, UA: 0.2 E.U./dL
pH, UA: 5 (ref 5.0–8.0)

## 2018-04-21 MED ORDER — NITROFURANTOIN MONOHYD MACRO 100 MG PO CAPS: ORAL_CAPSULE | ORAL | 0 refills | Status: DC

## 2018-04-21 NOTE — Telephone Encounter (Signed)
Refilled: 11/26/2017 Last OV: 01/07/2018 Next OV: not scheduled

## 2018-04-21 NOTE — Progress Notes (Signed)
cu

## 2018-04-21 NOTE — Telephone Encounter (Signed)
30-day supply sent to pharmacy.  Patient needs a follow-up visit for further refills.  Please try to get her scheduled.  Thanks.

## 2018-04-21 NOTE — Progress Notes (Signed)
[facility] [place], [place] [address]  Internal MEDICINE  Office Visit Note  Patient Name: Sandra Davidson  [id number]  [id number]  Date of Service: 04/21/2018  Chief Complaint  Patient presents with  . Urinary Tract Infection    FINISHED KEFLEX ON FRIDAY, ER FOLLOW UP    HPI  Pt is here for routine follow up. She continues to complain of burking and increase frequency. She has been treated about 3 times since January 2019. Cipro, doxy ( reaction with GI upset) and now keflex given by ED. Pt admits to not drinking enough water. Does take lasix   Current Medication: Outpatient Encounter Medications as of 04/21/2018  Medication Sig Note  . cephALEXin (KEFLEX) 500 MG capsule Take 500 mg by mouth 4 (four) times daily.   . ondansetron (ZOFRAN) 4 MG tablet Take 4 mg by mouth every 8 (eight) hours as needed for nausea or vomiting.   Marland Kitchen ALPRAZolam (XANAX) 0.5 MG tablet TAKE 1 TABLET BY MOUTH DAILY AS NEEDED FOR ANXIETY   . amLODipine-benazepril (LOTREL) 10-20 MG capsule Take 1 capsule by mouth daily.   Marland Kitchen atorvastatin (LIPITOR) 20 MG tablet Take 1 tablet (20 mg total) by mouth daily. 03/16/2018: Patient to take 1/2 tablet daily.   Marland Kitchen etodolac (LODINE) 400 MG tablet TAKE 1 TABLET BY MOUTH ONCE A DAY   . furosemide (LASIX) 20 MG tablet Take 1 tablet (20 mg total) by mouth daily.   Marland Kitchen ibuprofen (ADVIL,MOTRIN) 600 MG tablet TAKE 1 TABLET BY MOUTH EVERY 8 HOURS AS NEEDED   . nitrofurantoin, macrocrystal-monohydrate, (MACROBID) 100 MG capsule Take one tab po bid x 21 days then prn   . NONFORMULARY OR COMPOUNDED ITEM See pharmacy note   . potassium chloride (K-DUR) 10 MEQ tablet TAKE 1 TABLET BY MOUTH ONCE DAILY IF YOUTAKE FLUID PILL   . rOPINIRole (REQUIP) 0.5 MG tablet Take 1 tablet (0.5 mg total) by mouth at bedtime.   . [DISCONTINUED] doxycycline (VIBRA-TABS) 100 MG tablet Take 1 tablet (100 mg total) by mouth 2 (two) times daily.    No facility-administered encounter  medications on file as of 04/21/2018.     Surgical History: Past Surgical History:  Procedure Laterality Date  . BACK SURGERY    . MOHS SURGERY    . NECK SURGERY    . TONSILLECTOMY      Medical History: Past Medical History:  Diagnosis Date  . Allergy   . Anxiety   . Arthritis   . Cancer ([place])    skin  . Depression   . Diabetes mellitus without complication ([place])   . GERD (gastroesophageal reflux disease)   . Hypertension   . Migraines     Family History: Family History  Problem Relation Age of Onset  . Congestive Heart Failure Mother   . COPD Mother   . Emphysema Mother   . Hypertension Mother   . Kidney disease Mother   . Cancer Father   . Kidney disease Father     Social History   Socioeconomic History  . Marital status: Divorced    Spouse name: Not on file  . Number of children: Not on file  . Years of education: Not on file  . Highest education level: Not on file  Occupational History  . Not on file  Social Needs  . Financial resource strain: Not on file  . Food insecurity:    Worry: Not on file    Inability: Not on file  .  Transportation needs:    Medical: Not on file    Non-medical: Not on file  Tobacco Use  . Smoking status: Former Research scientist (life sciences)  . Smokeless tobacco: Never Used  Substance and Sexual Activity  . Alcohol use: No  . Drug use: No  . Sexual activity: Not Currently  Lifestyle  . Physical activity:    Days per week: Not on file    Minutes per session: Not on file  . Stress: Not on file  Relationships  . Social connections:    Talks on phone: Not on file    Gets together: Not on file    Attends religious service: Not on file    Active member of club or organization: Not on file    Attends meetings of clubs or organizations: Not on file    Relationship status: Not on file  . Intimate partner violence:    Fear of current or ex partner: Not on file    Emotionally abused: Not on file    Physically abused: Not on file    Forced  sexual activity: Not on file  Other Topics Concern  . Not on file  Social History Narrative  . Not on file   Review of Systems  Constitutional: Negative for chills, diaphoresis and fatigue.  HENT: Negative for ear pain, postnasal drip and sinus pressure.   Eyes: Negative for photophobia, discharge, redness, itching and visual disturbance.  Respiratory: Negative for cough, shortness of breath and wheezing.   Cardiovascular: Positive for leg swelling. Negative for chest pain and palpitations.  Gastrointestinal: Negative for abdominal pain, constipation, diarrhea, nausea and vomiting.  Genitourinary: Positive for dysuria and frequency. Negative for flank pain.  Musculoskeletal: Negative for arthralgias, back pain, gait problem and neck pain.  Skin: Negative for color change.  Allergic/Immunologic: Negative for environmental allergies and food allergies.  Neurological: Negative for dizziness and headaches.  Hematological: Does not bruise/bleed easily.  Psychiatric/Behavioral: Negative for agitation, behavioral problems (depression) and hallucinations.   Vital Signs: BP 108/68   Pulse 61   Temp (!) 97.4 F (36.3 C)   Resp 16   Ht 5' 7.5" (1.715 m)   Wt 140 lb 3.2 oz (63.6 kg)   SpO2 98%   BMI 21.63 kg/m   Physical Exam  Constitutional: She is oriented to person, place, and time. She appears well-developed and well-nourished. No distress.  HENT:  Head: Normocephalic and atraumatic.  Mouth/Throat: Oropharynx is clear and moist. No oropharyngeal exudate.  Eyes: Pupils are equal, round, and reactive to light. EOM are normal.  Neck: Normal range of motion. Neck supple. No JVD present. No tracheal deviation present. No thyromegaly present.  Cardiovascular: Normal rate, regular rhythm and normal heart sounds. Exam reveals no gallop and no friction rub.  No murmur heard. Pulmonary/Chest: Effort normal. No respiratory distress. She has no wheezes. She has no rales. She exhibits no  tenderness.  Abdominal: Soft. Bowel sounds are normal.  Musculoskeletal: Normal range of motion.  Lymphadenopathy:    She has no cervical adenopathy.  Neurological: She is alert and oriented to person, place, and time. No cranial nerve deficit.  Skin: Skin is warm and dry. She is not diaphoretic.  Psychiatric: She has a normal mood and affect. Her behavior is normal. Judgment and thought content normal.   Assessment/Plan: 1. Dysuria - POCT Urinalysis Dipstick - [place], retroperitnl abd,  ltd  2. H/O recurrent urinary tract infection - CULTURE, URINE COMPREHENSIVE - [place], retroperitnl abd,  ltd  3. Essential hypertension -  Hold diuretics  General Counseling: Bonney verbalizes understanding of the findings of todays visit and agrees with plan of treatment. I have discussed any further diagnostic evaluation that may be needed or ordered today. We also reviewed her medications today. she has been encouraged to call the office with any questions or concerns that should arise related to todays visit.    Counseling:    Orders Placed This Encounter  Procedures  . CULTURE, URINE COMPREHENSIVE  . [place], retroperitnl abd,  ltd  . POCT Urinalysis Dipstick    Meds ordered this encounter  Medications  . nitrofurantoin, macrocrystal-monohydrate, (MACROBID) 100 MG capsule    Sig: Take one tab po bid x 21 days then prn    Dispense:  65 capsule    Refill:  0    Time spent:25 Minutes  Dr Lavera Guise Internal medicine

## 2018-04-22 DIAGNOSIS — R300 Dysuria: Secondary | ICD-10-CM | POA: Insufficient documentation

## 2018-04-22 DIAGNOSIS — G2581 Restless legs syndrome: Secondary | ICD-10-CM | POA: Insufficient documentation

## 2018-04-22 NOTE — Telephone Encounter (Signed)
Patient is no longer a patient at this office per note from [facility]

## 2018-04-22 NOTE — Telephone Encounter (Signed)
cancelled

## 2018-04-22 NOTE — Telephone Encounter (Signed)
Please contact the pharmacy and cancel this prescription if it has not been picked up yet. She should get this from her new PCP. Thanks.

## 2018-04-24 ENCOUNTER — Other Ambulatory Visit: Payer: Self-pay | Admitting: Family Medicine

## 2018-04-24 NOTE — Progress Notes (Signed)
She does have uti, cnontinue macrobid, can u check if Sandra Davidson is a bladder u/s scheduled for her

## 2018-04-24 NOTE — Progress Notes (Signed)
lmom to let her know to continue macrobid.

## 2018-04-25 LAB — CULTURE, URINE COMPREHENSIVE

## 2018-04-27 ENCOUNTER — Other Ambulatory Visit: Payer: Self-pay | Admitting: Internal Medicine

## 2018-04-27 ENCOUNTER — Other Ambulatory Visit: Payer: Self-pay | Admitting: Family Medicine

## 2018-04-29 NOTE — Telephone Encounter (Signed)
Can you call the pharmacy to aske about this. Looks like this was ordered 04/27/2018 by different MD

## 2018-05-12 ENCOUNTER — Other Ambulatory Visit: Payer: Self-pay | Admitting: Internal Medicine

## 2018-05-12 DIAGNOSIS — N390 Urinary tract infection, site not specified: Secondary | ICD-10-CM

## 2018-05-12 DIAGNOSIS — R319 Hematuria, unspecified: Principal | ICD-10-CM

## 2018-05-15 ENCOUNTER — Ambulatory Visit (INDEPENDENT_AMBULATORY_CARE_PROVIDER_SITE_OTHER): Payer: PPO

## 2018-05-15 DIAGNOSIS — R319 Hematuria, unspecified: Secondary | ICD-10-CM | POA: Diagnosis not present

## 2018-05-15 DIAGNOSIS — N390 Urinary tract infection, site not specified: Secondary | ICD-10-CM | POA: Diagnosis not present

## 2018-05-19 DIAGNOSIS — R0789 Other chest pain: Secondary | ICD-10-CM | POA: Diagnosis not present

## 2018-05-19 DIAGNOSIS — I447 Left bundle-branch block, unspecified: Secondary | ICD-10-CM | POA: Diagnosis not present

## 2018-05-19 DIAGNOSIS — I10 Essential (primary) hypertension: Secondary | ICD-10-CM | POA: Diagnosis not present

## 2018-05-19 DIAGNOSIS — R600 Localized edema: Secondary | ICD-10-CM | POA: Diagnosis not present

## 2018-05-26 ENCOUNTER — Other Ambulatory Visit: Payer: Self-pay | Admitting: Nurse Practitioner

## 2018-05-26 ENCOUNTER — Other Ambulatory Visit: Payer: Self-pay | Admitting: Internal Medicine

## 2018-05-26 ENCOUNTER — Telehealth: Payer: Self-pay

## 2018-05-26 DIAGNOSIS — N390 Urinary tract infection, site not specified: Secondary | ICD-10-CM

## 2018-05-26 MED ORDER — NITROFURANTOIN MONOHYD MACRO 100 MG PO CAPS
ORAL_CAPSULE | ORAL | 0 refills | Status: DC
Start: 2018-05-26 — End: 2018-07-06

## 2018-05-26 NOTE — Progress Notes (Signed)
Renewed macrobid prescription. Take twice daily for 3 weeks then as needed. Reviewed ultrasound. Showed that she is retaining urine, but no other abnormalities were noted. We can refer to urology for further evaluation.

## 2018-05-26 NOTE — Telephone Encounter (Signed)
Renewed macrobid prescription. Take twice daily for 3 weeks then as needed. Reviewed ultrasound. Showed that she is retaining urine, but no other abnormalities were noted. We can refer to urology for further evaluation.  

## 2018-05-28 ENCOUNTER — Ambulatory Visit (INDEPENDENT_AMBULATORY_CARE_PROVIDER_SITE_OTHER): Payer: PPO | Admitting: Vascular Surgery

## 2018-06-04 ENCOUNTER — Encounter (INDEPENDENT_AMBULATORY_CARE_PROVIDER_SITE_OTHER): Payer: Self-pay | Admitting: Vascular Surgery

## 2018-06-04 ENCOUNTER — Ambulatory Visit (INDEPENDENT_AMBULATORY_CARE_PROVIDER_SITE_OTHER): Payer: PPO | Admitting: Vascular Surgery

## 2018-06-04 VITALS — BP 140/66 | HR 57 | Resp 14 | Ht 67.0 in | Wt 138.0 lb

## 2018-06-04 DIAGNOSIS — I872 Venous insufficiency (chronic) (peripheral): Secondary | ICD-10-CM

## 2018-06-04 DIAGNOSIS — M170 Bilateral primary osteoarthritis of knee: Secondary | ICD-10-CM | POA: Diagnosis not present

## 2018-06-04 DIAGNOSIS — I10 Essential (primary) hypertension: Secondary | ICD-10-CM

## 2018-06-07 ENCOUNTER — Encounter (INDEPENDENT_AMBULATORY_CARE_PROVIDER_SITE_OTHER): Payer: Self-pay | Admitting: Vascular Surgery

## 2018-06-07 NOTE — Progress Notes (Signed)
MRN : [id number]  Sandra Davidson is a 82 y.o. (04-May-1936) female who presents with chief complaint of  Chief Complaint  Patient presents with  . Follow-up    1 year follow up  .  History of Present Illness: The patient returns to the office for followup evaluation regarding leg swelling.  The swelling has improved quite a bit and the pain associated with swelling has decreased substantially. There have not been any interval development of a ulcerations or wounds.  Since the previous visit the patient has been wearing graduated compression stockings and has noted little significant improvement in the lymphedema. The patient has been using compression routinely morning until night.  The patient also states elevation during the day and exercise is being done too.    Current Meds  Medication Sig  . ALPRAZolam (XANAX) 0.5 MG tablet TAKE 1 TABLET BY MOUTH DAILY AS NEEDED FOR ANXIETY  . benazepril (LOTENSIN) 40 MG tablet   . etodolac (LODINE) 400 MG tablet TAKE 1 TABLET BY MOUTH ONCE A DAY  . ibuprofen (ADVIL,MOTRIN) 600 MG tablet TAKE 1 TABLET BY MOUTH EVERY 8 HOURS AS NEEDED  . nitrofurantoin, macrocrystal-monohydrate, (MACROBID) 100 MG capsule Take one tab po bid x 21 days then prn  . ondansetron (ZOFRAN) 4 MG tablet Take 4 mg by mouth every 8 (eight) hours as needed for nausea or vomiting.  Marland Kitchen rOPINIRole (REQUIP) 0.5 MG tablet Take 1 tablet (0.5 mg total) by mouth at bedtime.    Past Medical History:  Diagnosis Date  . Allergy   . Anxiety   . Arthritis   . Cancer ([place])    skin  . Depression   . Diabetes mellitus without complication ([place])   . GERD (gastroesophageal reflux disease)   . Hypertension   . Migraines     Past Surgical History:  Procedure Laterality Date  . BACK SURGERY    . MOHS SURGERY    . NECK SURGERY    . TONSILLECTOMY      Social History Social History   Tobacco Use  . Smoking status: Former Research scientist (life sciences)  . Smokeless tobacco: Never Used  Substance  Use Topics  . Alcohol use: No  . Drug use: No    Family History Family History  Problem Relation Age of Onset  . Congestive Heart Failure Mother   . COPD Mother   . Emphysema Mother   . Hypertension Mother   . Kidney disease Mother   . Cancer Father   . Kidney disease Father     Allergies  Allergen Reactions  . Codeine   . Demerol [Meperidine]   . Doxycycline Nausea And Vomiting    U  . Penicillins   . Sulfa Antibiotics      REVIEW OF SYSTEMS (Negative unless checked)  Constitutional: [] Weight loss  [] Fever  [] Chills Cardiac: [] Chest pain   [] Chest pressure   [] Palpitations   [] Shortness of breath when laying flat   [] Shortness of breath with exertion. Vascular:  [] Pain in legs with walking   [x] Pain in legs with dependency  [] History of DVT   [] Phlebitis   [x] Swelling in legs   [x] Varicose veins   [] Non-healing ulcers Pulmonary:   [] Uses home oxygen   [] Productive cough   [] Hemoptysis   [] Wheeze  [] COPD   [] Asthma Neurologic:  [] Dizziness   [] Seizures   [] History of stroke   [] History of TIA  [] Aphasia   [] Vissual changes   [] Weakness or numbness in arm   [] Weakness or  numbness in leg Musculoskeletal:   [] Joint swelling   [] Joint pain   [] Low back pain Hematologic:  [] Easy bruising  [] Easy bleeding   [] Hypercoagulable state   [] Anemic Gastrointestinal:  [] Diarrhea   [] Vomiting  [] Gastroesophageal reflux/heartburn   [] Difficulty swallowing. Genitourinary:  [] Chronic kidney disease   [] Difficult urination  [] Frequent urination   [] Blood in urine Skin:  [x] Rashes   [] Ulcers  Psychological:  [] History of anxiety   []  History of major depression.  Physical Examination  Vitals:   06/04/18 1607  BP: 140/66  Pulse: (!) 57  Resp: 14  Weight: 138 lb (62.6 kg)  Height: 5\' 7"  (1.702 m)   Body mass index is 21.61 kg/m. Gen: WD/WN, NAD Head: [place]/AT, No temporalis wasting.  Ear/Nose/Throat: Hearing grossly intact, nares w/o erythema or drainage Eyes: PER, EOMI, sclera  nonicteric.  Neck: Supple, no large masses.   Pulmonary:  Good air movement, no audible wheezing bilaterally, no use of accessory muscles.  Cardiac: RRR, no JVD Vascular: scattered varicosities present bilaterally.  Moderate venous stasis changes to the legs bilaterally.  2-3+ soft pitting edema Vessel Right Left  Radial Palpable Palpable  PT Palpable Palpable  DP Palpable Palpable  Gastrointestinal: Non-distended. No guarding/no peritoneal signs.  Musculoskeletal: M/S 5/5 throughout.  No deformity or atrophy.  Neurologic: CN 2-12 intact. Symmetrical.  Speech is fluent. Motor exam as listed above. Psychiatric: Judgment intact, Mood & affect appropriate for pt's clinical situation. Dermatologic: venous rashes no ulcers noted.  No changes consistent with cellulitis. Lymph : No lichenification or skin changes of chronic lymphedema.  CBC Lab Results  Component Value Date   WBC 6.9 04/06/2018   HGB 14.1 04/06/2018   HCT 40.8 04/06/2018   MCV 92.8 04/06/2018   PLT 229 04/06/2018    BMET    Component Value Date/Time   NA 137 04/06/2018 1332   K 4.6 04/06/2018 1332   CL 105 04/06/2018 1332   CO2 22 04/06/2018 1332   GLUCOSE 120 (H) 04/06/2018 1332   BUN 31 (H) 04/06/2018 1332   CREATININE 1.36 (H) 04/06/2018 1332   CALCIUM 9.3 04/06/2018 1332   GFRNONAA 36 (L) 04/06/2018 1332   GFRAA 42 (L) 04/06/2018 1332   CrCl cannot be calculated (Patient's most recent lab result is older than the maximum 21 days allowed.).  COAG No results found for: INR, PROTIME  Radiology No results found.    Assessment/Plan 1. Chronic venous insufficiency No surgery or intervention at this point in time.    I have had a long discussion with the patient regarding venous insufficiency and why it  causes symptoms. I have discussed with the patient the chronic skin changes that accompany venous insufficiency and the long term sequela such as infection and ulceration.  Patient will begin wearing  graduated compression stockings class 1 (20-30 mmHg) or compression wraps on a daily basis a prescription was given. The patient will put the stockings on first thing in the morning and removing them in the evening. The patient is instructed specifically not to sleep in the stockings.    In addition, behavioral modification including several periods of elevation of the lower extremities during the day will be continued. I have demonstrated that proper elevation is a position with the ankles at heart level.  The patient is instructed to begin routine exercise, especially walking on a daily basis  Following the review of the ultrasound the patient will follow up in 2 years to reassess the degree of swelling and the control  that graduated compression stockings or compression wraps  is offering.   The patient can be assessed for a Lymph Pump at that time  2. Essential hypertension Continue antihypertensive medications as already ordered, these medications have been reviewed and there are no changes at this time.   3. Primary osteoarthritis of both knees Continue NSAID medications as already ordered, these medications have been reviewed and there are no changes at this time.  Continued activity and therapy was stressed.   Hortencia Pilar, MD  06/07/2018 1:56 PM

## 2018-06-18 DIAGNOSIS — R600 Localized edema: Secondary | ICD-10-CM | POA: Diagnosis not present

## 2018-06-18 DIAGNOSIS — I10 Essential (primary) hypertension: Secondary | ICD-10-CM | POA: Diagnosis not present

## 2018-06-18 DIAGNOSIS — R001 Bradycardia, unspecified: Secondary | ICD-10-CM | POA: Diagnosis not present

## 2018-06-23 ENCOUNTER — Encounter: Payer: Self-pay | Admitting: Nurse Practitioner

## 2018-06-23 ENCOUNTER — Ambulatory Visit (INDEPENDENT_AMBULATORY_CARE_PROVIDER_SITE_OTHER): Payer: PPO | Admitting: Nurse Practitioner

## 2018-06-23 VITALS — BP 148/70 | HR 64 | Resp 16 | Ht 67.5 in | Wt 134.6 lb

## 2018-06-23 DIAGNOSIS — G8929 Other chronic pain: Secondary | ICD-10-CM

## 2018-06-23 DIAGNOSIS — R300 Dysuria: Secondary | ICD-10-CM | POA: Diagnosis not present

## 2018-06-23 DIAGNOSIS — Z0001 Encounter for general adult medical examination with abnormal findings: Secondary | ICD-10-CM

## 2018-06-23 DIAGNOSIS — M25562 Pain in left knee: Secondary | ICD-10-CM

## 2018-06-23 DIAGNOSIS — I10 Essential (primary) hypertension: Secondary | ICD-10-CM

## 2018-06-23 DIAGNOSIS — M25561 Pain in right knee: Secondary | ICD-10-CM

## 2018-06-23 DIAGNOSIS — Z23 Encounter for immunization: Secondary | ICD-10-CM | POA: Diagnosis not present

## 2018-06-23 DIAGNOSIS — F411 Generalized anxiety disorder: Secondary | ICD-10-CM | POA: Diagnosis not present

## 2018-06-23 DIAGNOSIS — G2581 Restless legs syndrome: Secondary | ICD-10-CM | POA: Diagnosis not present

## 2018-06-23 MED ORDER — ALPRAZOLAM 0.5 MG PO TABS: 0.5000 mg | ORAL_TABLET | Freq: Every evening | ORAL | 3 refills | Status: DC | PRN

## 2018-06-23 MED ORDER — ROPINIROLE HCL 1 MG PO TABS: 1.0000 mg | ORAL_TABLET | Freq: Every day | ORAL | 3 refills | Status: DC

## 2018-06-23 NOTE — Progress Notes (Signed)
[facility] [place], [place] [address]  Internal MEDICINE  Office Visit Note  Patient Name: Sandra Davidson  [id number]  [id number]  Date of Service: 07/18/2018   Pt is here for routine health maintenance examination  Chief Complaint  Patient presents with  . Annual Exam    3 month awv  . Hypertension  . Knee Pain    bilateral     The patient c/o bilateral knee pain. They get swollen and hurt more when she is up walking for longer than 5 minutes. Feel like this might be contributing to numbness in both feet.   Hypertension  This is a chronic problem. The current episode started more than 1 year ago. The problem has been waxing and waning since onset. The problem is resistant. Associated symptoms include headaches and peripheral edema. Pertinent negatives include no chest pain, neck pain, palpitations or shortness of breath. Agents associated with hypertension include NSAIDs. Risk factors for coronary artery disease include dyslipidemia and post-menopausal state. Past treatments include ACE inhibitors, alpha 1 blockers, lifestyle changes and diuretics. The current treatment provides moderate improvement. There are no compliance problems.  Hypertensive end-organ damage includes kidney disease. Identifiable causes of hypertension include chronic renal disease.   Current Medication: Outpatient Encounter Medications as of 06/23/2018  Medication Sig Note  . ALPRAZolam (XANAX) 0.5 MG tablet Take 1 tablet (0.5 mg total) by mouth at bedtime as needed. for anxiety   . etodolac (LODINE) 400 MG tablet TAKE 1 TABLET BY MOUTH ONCE A DAY   . rOPINIRole (REQUIP) 1 MG tablet Take 1 tablet (1 mg total) by mouth at bedtime.   Marland Kitchen spironolactone (ALDACTONE) 25 MG tablet Take 25 mg by mouth daily.   . [DISCONTINUED] ALPRAZolam (XANAX) 0.5 MG tablet TAKE 1 TABLET BY MOUTH DAILY AS NEEDED FOR ANXIETY   . [DISCONTINUED] ALPRAZolam (XANAX) 0.5 MG tablet Take 1 tablet (0.5 mg total)  by mouth at bedtime as needed. for anxiety   . [DISCONTINUED] cephALEXin (KEFLEX) 500 MG capsule Take 500 mg by mouth 4 (four) times daily.   . [DISCONTINUED] ibuprofen (ADVIL,MOTRIN) 600 MG tablet TAKE 1 TABLET BY MOUTH EVERY 8 HOURS AS NEEDED   . [DISCONTINUED] nitrofurantoin, macrocrystal-monohydrate, (MACROBID) 100 MG capsule Take one tab po bid x 21 days then prn   . [DISCONTINUED] rOPINIRole (REQUIP) 0.5 MG tablet Take 1 tablet (0.5 mg total) by mouth at bedtime.   . [DISCONTINUED] amLODipine-benazepril (LOTREL) 10-20 MG capsule Take 1 capsule by mouth daily. (Patient not taking: Reported on 06/04/2018)   . [DISCONTINUED] atorvastatin (LIPITOR) 20 MG tablet Take 1 tablet (20 mg total) by mouth daily. (Patient not taking: Reported on 06/04/2018) 03/16/2018: Patient to take 1/2 tablet daily.   . [DISCONTINUED] benazepril (LOTENSIN) 40 MG tablet    . [DISCONTINUED] furosemide (LASIX) 20 MG tablet Take 1 tablet (20 mg total) by mouth daily. (Patient not taking: Reported on 06/04/2018)   . [DISCONTINUED] NONFORMULARY OR COMPOUNDED ITEM See pharmacy note (Patient not taking: Reported on 06/04/2018)   . [DISCONTINUED] ondansetron (ZOFRAN) 4 MG tablet Take 4 mg by mouth every 8 (eight) hours as needed for nausea or vomiting.   . [DISCONTINUED] potassium chloride (K-DUR) 10 MEQ tablet TAKE 1 TABLET BY MOUTH ONCE DAILY IF YOUTAKE FLUID PILL (Patient not taking: Reported on 06/04/2018)    No facility-administered encounter medications on file as of 06/23/2018.     Surgical History: Past Surgical History:  Procedure Laterality Date  . BACK SURGERY    .  MOHS SURGERY    . NECK SURGERY    . TONSILLECTOMY      Medical History: Past Medical History:  Diagnosis Date  . Allergy   . Anxiety   . Arthritis   . Cancer ([place])    skin  . Depression   . GERD (gastroesophageal reflux disease)   . Hypertension   . Migraines     Family History: Family History  Problem Relation Age of Onset  . Congestive  Heart Failure Mother   . COPD Mother   . Emphysema Mother   . Hypertension Mother   . Kidney disease Mother   . Cancer Father   . Kidney disease Father   . Bladder Cancer Neg Hx       Review of Systems  Constitutional: Negative for activity change, appetite change, chills, diaphoresis, fatigue, fever and unexpected weight change.  HENT: Negative for congestion, postnasal drip, rhinorrhea, sneezing and sore throat.   Eyes: Negative.  Negative for redness.  Respiratory: Negative for cough, chest tightness, shortness of breath and wheezing.   Cardiovascular: Negative for chest pain and palpitations.       Elevated blood pressure  Gastrointestinal: Negative for abdominal pain, constipation, diarrhea, nausea and vomiting.  Endocrine: Negative for cold intolerance, heat intolerance, polydipsia, polyphagia and polyuria.  Genitourinary: Negative for dysuria, frequency, hematuria and urgency.  Musculoskeletal: Positive for arthralgias. Negative for back pain, joint swelling and neck pain.       Bilateral knee pain.   Skin: Negative for rash.  Allergic/Immunologic: Negative for environmental allergies.  Neurological: Positive for headaches. Negative for tremors and numbness.  Hematological: Negative for adenopathy. Does not bruise/bleed easily.  Psychiatric/Behavioral: Negative for behavioral problems (Depression), sleep disturbance and suicidal ideas. The patient is nervous/anxious.      Today's Vitals   06/23/18 1356 06/23/18 1457  BP: (!) 152/72 (!) 148/70  Pulse: 64   Resp: 16   SpO2: 96%   Weight: 134 lb 9.6 oz (61.1 kg)   Height: 5' 7.5" (1.715 m)     Physical Exam  Constitutional: She is oriented to person, place, and time. She appears well-developed and well-nourished. She appears ill. No distress.  HENT:  Head: Normocephalic and atraumatic.  Nose: Nose normal.  Mouth/Throat: Oropharynx is clear and moist. No oropharyngeal exudate.  Eyes: Pupils are equal, round, and  reactive to light. Conjunctivae and EOM are normal.  Neck: Normal range of motion. Neck supple. No JVD present. No tracheal deviation present. No thyromegaly present.  Cardiovascular: Normal rate, regular rhythm, normal heart sounds and intact distal pulses. Exam reveals no gallop and no friction rub.  No murmur heard. Pulmonary/Chest: Effort normal and breath sounds normal. No respiratory distress. She has no wheezes. She has no rales. She exhibits no tenderness.  Abdominal: Soft. Bowel sounds are normal. There is tenderness.  Musculoskeletal: Normal range of motion.  Lymphadenopathy:    She has no cervical adenopathy.  Neurological: She is alert and oriented to person, place, and time. No cranial nerve deficit.  Skin: Skin is warm and dry. Capillary refill takes 2 to 3 seconds. She is not diaphoretic.  Psychiatric: She has a normal mood and affect. Her behavior is normal. Judgment and thought content normal.  Nursing note and vitals reviewed.    LABS: Recent Results (from the past 2160 hour(s))  POCT Urinalysis Dipstick     Status: Abnormal   Collection Time: 04/21/18 11:11 AM  Result Value Ref Range   Color, UA  Clarity, UA     Glucose, UA negative    Bilirubin, UA large    Ketones, UA negative    Spec Grav, UA 1.015 1.010 - 1.025   Blood, UA negative    pH, UA 5.0 5.0 - 8.0   Protein, UA negative    Urobilinogen, UA 0.2 0.2 or 1.0 E.U./dL   Nitrite, UA negative    Leukocytes, UA Moderate (2+) (A) Negative   Appearance     Odor    CULTURE, URINE COMPREHENSIVE     Status: Abnormal   Collection Time: 04/21/18 11:29 AM  Result Value Ref Range   Urine Culture, Comprehensive Final report (A)    Organism ID, Bacteria Yeast isolated. (A)     Comment: 25,000-50,000 colony forming units per mL Request for further identification must be made within 1 week.    Organism ID, Bacteria Comment (A)     Comment: Escherichia coli, identified by an automated biochemical system. 3,000  Colonies/mL    ANTIMICROBIAL SUSCEPTIBILITY Comment     Comment:       ** S = Susceptible; I = Intermediate; R = Resistant **                    P = Positive; N = Negative             MICS are expressed in micrograms per mL    Antibiotic                 RSLT#1    RSLT#2    RSLT#3    RSLT#4 Amoxicillin/Clavulanic Acid              S Ampicillin                               I Cefepime                                 S Ceftriaxone                              S Cefuroxime                               I Ciprofloxacin                            S Ertapenem                                S Gentamicin                               S Imipenem                                 S Levofloxacin                             S Meropenem  S Nitrofurantoin                           S Piperacillin/Tazobactam                  S Tetracycline                             S Tobramycin                               S Trimethoprim/Sulfa                       S   UA/M w/rflx Culture, Routine     Status: Abnormal   Collection Time: 06/23/18  2:00 AM  Result Value Ref Range   Specific Gravity, UA 1.013 1.005 - 1.030   pH, UA 5.0 5.0 - 7.5   Color, UA Yellow Yellow   Appearance Ur Clear Clear   Leukocytes, UA 2+ (A) Negative   Protein, UA Negative Negative/Trace   Glucose, UA Negative Negative   Ketones, UA Negative Negative   RBC, UA Negative Negative   Bilirubin, UA Negative Negative   Urobilinogen, Ur 0.2 0.2 - 1.0 mg/dL   Nitrite, UA Negative Negative   Microscopic Examination See below:     Comment: Microscopic was indicated and was performed.   Urinalysis Reflex Comment     Comment: This specimen has reflexed to a Urine Culture.  Microscopic Examination     Status: Abnormal   Collection Time: 06/23/18  2:00 AM  Result Value Ref Range   WBC, UA 6-10 (A) 0 - 5 /hpf   RBC, UA 0-2 0 - 2 /hpf   Epithelial Cells (non renal) 0-10 0 - 10 /hpf   Casts None seen None seen  /lpf   Crystals Present (A) N/A   Crystal Type Calcium Oxalate N/A   Mucus, UA Present Not Estab.   Bacteria, UA Few None seen/Few  Urine Culture, Reflex     Status: Abnormal   Collection Time: 06/23/18  2:00 AM  Result Value Ref Range   Urine Culture, Routine Final report (A)    Organism ID, Bacteria Comment (A)     Comment: Escherichia coli, identified by an automated biochemical system. Greater than 100,000 colony forming units per mL Cefazolin <=4 ug/mL Cefazolin with an MIC <=16 predicts susceptibility to the oral agents cefaclor, cefdinir, cefpodoxime, cefprozil, cefuroxime, cephalexin, and loracarbef when used for therapy of uncomplicated urinary tract infections due to E. coli, Klebsiella pneumoniae, and Proteus mirabilis.    Antimicrobial Susceptibility Comment     Comment:       ** S = Susceptible; I = Intermediate; R = Resistant **                    P = Positive; N = Negative             MICS are expressed in micrograms per mL    Antibiotic                 RSLT#1    RSLT#2    RSLT#3    RSLT#4 Amoxicillin/Clavulanic Acid    S Ampicillin                     S Cefepime  S Ceftriaxone                    S Cefuroxime                     S Ciprofloxacin                  S Ertapenem                      S Gentamicin                     S Imipenem                       S Levofloxacin                   S Meropenem                      S Nitrofurantoin                 S Piperacillin/Tazobactam        S Tetracycline                   S Tobramycin                     S Trimethoprim/Sulfa             S   Urinalysis, Complete     Status: Abnormal   Collection Time: 07/06/18  2:14 PM  Result Value Ref Range   Specific Gravity, UA 1.020 1.005 - 1.030   pH, UA 6.5 5.0 - 7.5   Color, UA Yellow Yellow   Appearance Ur Clear Clear   Leukocytes, UA Trace (A) Negative   Protein, UA Negative Negative/Trace   Glucose, UA Negative Negative   Ketones, UA  Negative Negative   RBC, UA Trace (A) Negative   Bilirubin, UA Negative Negative   Urobilinogen, Ur 0.2 0.2 - 1.0 mg/dL   Nitrite, UA Negative Negative   Microscopic Examination See below:   Microscopic Examination     Status: Abnormal   Collection Time: 07/06/18  2:14 PM  Result Value Ref Range   WBC, UA 0-5 0 - 5 /hpf   RBC, UA 0-2 0 - 2 /hpf   Epithelial Cells (non renal) 0-10 0 - 10 /hpf   Casts Present (A) None seen /lpf   Cast Type Hyaline casts N/A   Mucus, UA Present (A) Not Estab.   Bacteria, UA Few (A) None seen/Few   Trichomonas, UA Present (A) None seen  CULTURE, URINE COMPREHENSIVE     Status: None   Collection Time: 07/06/18  2:56 PM  Result Value Ref Range   Urine Culture, Comprehensive Final report    Organism ID, Bacteria Comment     Comment: No growth in 36 - 48 hours.    Assessment/Plan: 1. Encounter for general adult medical examination with abnormal findings Annual wellness visit today.  2. Restless leg syndrome, controlled Increase requip to 1mg  at bedtime.  - rOPINIRole (REQUIP) 1 MG tablet; Take 1 tablet (1 mg total) by mouth at bedtime.  Dispense: 30 tablet; Refill: 3  3. Chronic pain of both knees X-ray both knees and refer to orthopedics as indicated  - DG Knee Complete 4 Views Right; Future - DG Knee Complete 4 Views Left; Future  4. Generalized anxiety  disorder May continue alprazolam 0.5mg  at bedtime as needed. New rx provided.  - ALPRAZolam (XANAX) 0.5 MG tablet; Take 1 tablet (0.5 mg total) by mouth at bedtime as needed. for anxiety  Dispense: 30 tablet; Refill: 3  5. Need for vaccination against Streptococcus pneumoniae using pneumococcal conjugate vaccine 13 - Pneumococcal conjugate vaccine 13-valent IM  6. Dysuria - UA/M w/rflx Culture, Routine  General Counseling: Darling verbalizes understanding of the findings of todays visit and agrees with plan of treatment. I have discussed any further diagnostic evaluation that may be needed or  ordered today. We also reviewed her medications today. she has been encouraged to call the office with any questions or concerns that should arise related to todays visit.    Counseling:  This patient was seen by Leretha Pol FNP Collaboration with Dr Lavera Guise as a part of collaborative care agreement  Orders Placed This Encounter  Procedures  . Microscopic Examination  . Urine Culture, Reflex  . DG Knee Complete 4 Views Right  . DG Knee Complete 4 Views Left  . Pneumococcal conjugate vaccine 13-valent IM  . UA/M w/rflx Culture, Routine    Meds ordered this encounter  Medications  . rOPINIRole (REQUIP) 1 MG tablet    Sig: Take 1 tablet (1 mg total) by mouth at bedtime.    Dispense:  30 tablet    Refill:  3    Please note increased dose    Order Specific Question:   Supervising Provider    Answer:   Lavera Guise [[id number]]  . DISCONTD: ALPRAZolam (XANAX) 0.5 MG tablet    Sig: Take 1 tablet (0.5 mg total) by mouth at bedtime as needed. for anxiety    Dispense:  30 tablet    Refill:  3    Order Specific Question:   Supervising Provider    Answer:   Lavera Guise [[id number]]  . ALPRAZolam (XANAX) 0.5 MG tablet    Sig: Take 1 tablet (0.5 mg total) by mouth at bedtime as needed. for anxiety    Dispense:  30 tablet    Refill:  3    Order Specific Question:   Supervising Provider    Answer:   Lavera Guise [[id number]]    Time spent: [place], MD  Internal Medicine

## 2018-06-24 ENCOUNTER — Other Ambulatory Visit: Payer: Self-pay | Admitting: Nurse Practitioner

## 2018-06-24 ENCOUNTER — Ambulatory Visit
Admission: RE | Admit: 2018-06-24 | Discharge: 2018-06-24 | Disposition: A | Discharge status: Home / Self Care | Payer: PPO | Source: Ambulatory Visit | Attending: Nurse Practitioner | Admitting: Nurse Practitioner

## 2018-06-24 DIAGNOSIS — M1712 Unilateral primary osteoarthritis, left knee: Secondary | ICD-10-CM | POA: Diagnosis not present

## 2018-06-24 DIAGNOSIS — M11262 Other chondrocalcinosis, left knee: Secondary | ICD-10-CM | POA: Diagnosis not present

## 2018-06-24 DIAGNOSIS — M25561 Pain in right knee: Secondary | ICD-10-CM | POA: Insufficient documentation

## 2018-06-24 DIAGNOSIS — M1711 Unilateral primary osteoarthritis, right knee: Secondary | ICD-10-CM | POA: Diagnosis not present

## 2018-06-24 DIAGNOSIS — M11261 Other chondrocalcinosis, right knee: Secondary | ICD-10-CM | POA: Insufficient documentation

## 2018-06-24 DIAGNOSIS — M25562 Pain in left knee: Secondary | ICD-10-CM | POA: Insufficient documentation

## 2018-06-24 DIAGNOSIS — M170 Bilateral primary osteoarthritis of knee: Secondary | ICD-10-CM | POA: Insufficient documentation

## 2018-06-24 DIAGNOSIS — G8929 Other chronic pain: Secondary | ICD-10-CM | POA: Diagnosis not present

## 2018-06-24 MED ORDER — IBUPROFEN 600 MG PO TABS: 600.0000 mg | ORAL_TABLET | Freq: Three times a day (TID) | ORAL | 3 refills | Status: DC | PRN

## 2018-06-28 LAB — UA/M W/RFLX CULTURE, ROUTINE
Bilirubin, UA: NEGATIVE
GLUCOSE, UA: NEGATIVE
Ketones, UA: NEGATIVE
Nitrite, UA: NEGATIVE
PH UA: 5 (ref 5.0–7.5)
Protein, UA: NEGATIVE
RBC, UA: NEGATIVE
SPEC GRAV UA: 1.013 (ref 1.005–1.030)
Urobilinogen, Ur: 0.2 mg/dL (ref 0.2–1.0)

## 2018-06-28 LAB — MICROSCOPIC EXAMINATION: CASTS: NONE SEEN /LPF

## 2018-06-28 LAB — URINE CULTURE, REFLEX

## 2018-06-30 ENCOUNTER — Telehealth: Payer: Self-pay

## 2018-06-30 NOTE — Progress Notes (Signed)
Pt was notified.  

## 2018-06-30 NOTE — Telephone Encounter (Signed)
Called pt to let her know x-ray results.

## 2018-07-06 ENCOUNTER — Encounter: Payer: Self-pay | Admitting: Urology

## 2018-07-06 ENCOUNTER — Ambulatory Visit (INDEPENDENT_AMBULATORY_CARE_PROVIDER_SITE_OTHER): Payer: PPO | Admitting: Urology

## 2018-07-06 VITALS — BP 145/68 | HR 66 | Ht 67.5 in | Wt 135.4 lb

## 2018-07-06 DIAGNOSIS — N390 Urinary tract infection, site not specified: Secondary | ICD-10-CM

## 2018-07-06 DIAGNOSIS — N3020 Other chronic cystitis without hematuria: Secondary | ICD-10-CM

## 2018-07-06 LAB — URINALYSIS, COMPLETE
BILIRUBIN UA: NEGATIVE
GLUCOSE, UA: NEGATIVE
KETONES UA: NEGATIVE
NITRITE UA: NEGATIVE
Protein, UA: NEGATIVE
SPEC GRAV UA: 1.02 (ref 1.005–1.030)
UUROB: 0.2 mg/dL (ref 0.2–1.0)
pH, UA: 6.5 (ref 5.0–7.5)

## 2018-07-06 LAB — MICROSCOPIC EXAMINATION

## 2018-07-06 MED ORDER — NITROFURANTOIN MONOHYD MACRO 100 MG PO CAPS: 100.0000 mg | ORAL_CAPSULE | Freq: Two times a day (BID) | ORAL | 11 refills | Status: DC

## 2018-07-06 NOTE — Progress Notes (Signed)
07/06/2018 2:02 PM   Hatley 1936/05/07 [id number]  Referring provider: Ronnell Freshwater, NP [address] [place], [place] [address]  Chief Complaint  Patient presents with  . Recurrent UTI    HPI: Was consulted to assess the patient's recurrent bladder infections.  Some of the details of the history were difficult to ascertain.  I tried to detect the number of infections that the patient has had in the last several months and she is been treated 5 times.  I do think her dysuria and incontinence of small-volume frequency at least temporarily get better when she is given an antibiotic but again was difficult to sort out  Sometimes she is a little bit urge incontinence.  She get up many times a night even 10 times.  She said that she forgot what was normal since of all her bladder infections.  She had a hysterectomy and back surgery.  She has not had bladder surgery or kidney stone  Modifying factors: There are no other modifying factors  Associated signs and symptoms: There are no other associated signs and symptoms Aggravating and relieving factors: There are no other aggravating or relieving factors Severity: Moderate Duration: Persistent   PMH: Past Medical History:  Diagnosis Date  . Allergy   . Anxiety   . Arthritis   . Cancer ([place])    skin  . Depression   . GERD (gastroesophageal reflux disease)   . Hypertension   . Migraines     Surgical History: Past Surgical History:  Procedure Laterality Date  . BACK SURGERY    . MOHS SURGERY    . NECK SURGERY    . TONSILLECTOMY      Home Medications:  Allergies as of 07/06/2018      Reactions   Codeine    Demerol [meperidine]    Doxycycline Nausea And Vomiting   U   Penicillins    Sulfa Antibiotics       Medication List        Accurate as of 07/06/18  2:02 PM. Always use your most recent med list.          ALPRAZolam 0.5 MG tablet Commonly known as:  XANAX Take 1 tablet (0.5 mg total) by  mouth at bedtime as needed. for anxiety   etodolac 400 MG tablet Commonly known as:  LODINE TAKE 1 TABLET BY MOUTH ONCE A DAY   ibuprofen 600 MG tablet Commonly known as:  ADVIL,MOTRIN Take 1 tablet (600 mg total) by mouth every 8 (eight) hours as needed.   rOPINIRole 1 MG tablet Commonly known as:  REQUIP Take 1 tablet (1 mg total) by mouth at bedtime.   spironolactone 25 MG tablet Commonly known as:  ALDACTONE Take 25 mg by mouth daily.       Allergies:  Allergies  Allergen Reactions  . Codeine   . Demerol [Meperidine]   . Doxycycline Nausea And Vomiting    U  . Penicillins   . Sulfa Antibiotics     Family History: Family History  Problem Relation Age of Onset  . Congestive Heart Failure Mother   . COPD Mother   . Emphysema Mother   . Hypertension Mother   . Kidney disease Mother   . Cancer Father   . Kidney disease Father   . Bladder Cancer Neg Hx     Social History:  reports that she has quit smoking. She has never used smokeless tobacco. She reports that she does not drink alcohol  or use drugs.  ROS: UROLOGY Frequent Urination?: Yes Hard to postpone urination?: Yes Burning/pain with urination?: No Get up at night to urinate?: Yes Leakage of urine?: Yes Urine stream starts and stops?: Yes Trouble starting stream?: No Do you have to strain to urinate?: No Blood in urine?: No Urinary tract infection?: Yes Sexually transmitted disease?: No Injury to kidneys or bladder?: No Painful intercourse?: No Weak stream?: No Currently pregnant?: No Vaginal bleeding?: No Last menstrual period?: n  Gastrointestinal Nausea?: Yes Vomiting?: Yes Indigestion/heartburn?: Yes Diarrhea?: Yes Constipation?: No  Constitutional Fever: No Night sweats?: No Weight loss?: Yes Fatigue?: Yes  Skin Skin rash/lesions?: No Itching?: No  Eyes Blurred vision?: Yes Double vision?: No  Ears/Nose/Throat Sore throat?: No Sinus problems?:  No  Hematologic/Lymphatic Swollen glands?: No Easy bruising?: No  Cardiovascular Leg swelling?: Yes Chest pain?: No  Respiratory Cough?: Yes Shortness of breath?: Yes  Endocrine Excessive thirst?: No  Musculoskeletal Back pain?: Yes Joint pain?: Yes  Neurological Headaches?: Yes Dizziness?: Yes  Psychologic Depression?: Yes Anxiety?: Yes  Physical Exam: BP (!) 145/68 (BP Location: Left Arm, Patient Position: Sitting, Cuff Size: Normal)   Pulse 66   Ht 5' 7.5" (1.715 m)   Wt 135 lb 6.4 oz (61.4 kg)   BMI 20.89 kg/m   Constitutional:  Alert and oriented, No acute distress. HEENT: [place] AT, moist mucus membranes.  Trachea midline, no masses. Cardiovascular: No clubbing, cyanosis, or edema. Respiratory: Normal respiratory effort, no increased work of breathing. GI: Abdomen is soft, nontender, nondistended, no abdominal masses GU: No CVA tenderness.  No bladder tenderness Skin: No rashes, bruises or suspicious lesions. Lymph: No cervical or inguinal adenopathy. Neurologic: Grossly intact, no focal deficits, moving all 4 extremities. Psychiatric: Normal mood and affect.  Laboratory Data: Lab Results  Component Value Date   WBC 6.9 04/06/2018   HGB 14.1 04/06/2018   HCT 40.8 04/06/2018   MCV 92.8 04/06/2018   PLT 229 04/06/2018    Lab Results  Component Value Date   CREATININE 1.36 (H) 04/06/2018    No results found for: PSA  No results found for: TESTOSTERONE  Lab Results  Component Value Date   HGBA1C 6.0 11/26/2017    Urinalysis    Component Value Date/Time   COLORURINE YELLOW (A) 04/06/2018 1332   APPEARANCEUR Clear 06/23/2018 0200   LABSPEC 1.012 04/06/2018 1332   PHURINE 5.0 04/06/2018 1332   GLUCOSEU Negative 06/23/2018 0200   HGBUR SMALL (A) 04/06/2018 1332   BILIRUBINUR Negative 06/23/2018 0200   KETONESUR NEGATIVE 04/06/2018 1332   PROTEINUR Negative 06/23/2018 0200   PROTEINUR NEGATIVE 04/06/2018 1332   UROBILINOGEN 0.2 04/21/2018  1111   NITRITE Negative 06/23/2018 0200   NITRITE NEGATIVE 04/06/2018 1332   LEUKOCYTESUR 2+ (A) 06/23/2018 0200    Pertinent Imaging:   Assessment & Plan: Pathophysiology of chronic cystitis discussed.  It was difficult to sort out her history regarding baseline symptoms.  There is no question she has had many positive cultures.  Urine sent for culture today.  Start on prophylaxis.   On further discussion the patient has been on daily Macrodantin for about 8 weeks and reports positive cultures.  She said she does not tolerate sulfa or penicillin gets quite ill.  She understands that my treatment tools are somewhat limited.  I will call pending the culture results.  I was called in daily prophylaxis.  Reassess 8 weeks.  Order renal ultrasound to rule out stone  There are no diagnoses linked to this encounter.  No follow-ups on file.  Reece Packer, MD  [facility] [address] [place], [place] [address] [phone]

## 2018-07-09 ENCOUNTER — Telehealth: Payer: Self-pay | Admitting: Urology

## 2018-07-09 ENCOUNTER — Ambulatory Visit: Admission: RE | Admit: 2018-07-09 | Payer: PPO | Source: Ambulatory Visit

## 2018-07-09 LAB — CULTURE, URINE COMPREHENSIVE

## 2018-07-09 NOTE — Telephone Encounter (Signed)
Patient no showed for her RUS app on 07-09-18  [facility]

## 2018-07-13 ENCOUNTER — Ambulatory Visit
Admission: RE | Admit: 2018-07-13 | Discharge: 2018-07-13 | Disposition: A | Discharge status: Home / Self Care | Payer: PPO | Source: Ambulatory Visit | Attending: Urology | Admitting: Urology

## 2018-07-13 DIAGNOSIS — N390 Urinary tract infection, site not specified: Secondary | ICD-10-CM | POA: Insufficient documentation

## 2018-07-13 DIAGNOSIS — N3020 Other chronic cystitis without hematuria: Secondary | ICD-10-CM

## 2018-07-18 DIAGNOSIS — M79606 Pain in leg, unspecified: Secondary | ICD-10-CM | POA: Insufficient documentation

## 2018-07-18 DIAGNOSIS — Z23 Encounter for immunization: Secondary | ICD-10-CM | POA: Insufficient documentation

## 2018-07-18 DIAGNOSIS — F411 Generalized anxiety disorder: Secondary | ICD-10-CM | POA: Insufficient documentation

## 2018-07-18 DIAGNOSIS — Z0001 Encounter for general adult medical examination with abnormal findings: Secondary | ICD-10-CM | POA: Insufficient documentation

## 2018-07-23 DIAGNOSIS — I447 Left bundle-branch block, unspecified: Secondary | ICD-10-CM | POA: Diagnosis not present

## 2018-07-23 DIAGNOSIS — R001 Bradycardia, unspecified: Secondary | ICD-10-CM | POA: Diagnosis not present

## 2018-07-23 DIAGNOSIS — I10 Essential (primary) hypertension: Secondary | ICD-10-CM | POA: Diagnosis not present

## 2018-07-27 ENCOUNTER — Encounter: Payer: Self-pay | Admitting: Nurse Practitioner

## 2018-07-27 ENCOUNTER — Ambulatory Visit (INDEPENDENT_AMBULATORY_CARE_PROVIDER_SITE_OTHER): Payer: PPO | Admitting: Nurse Practitioner

## 2018-07-27 VITALS — BP 142/65 | HR 64 | Resp 16 | Ht 67.5 in | Wt 135.2 lb

## 2018-07-27 DIAGNOSIS — G2581 Restless legs syndrome: Secondary | ICD-10-CM

## 2018-07-27 DIAGNOSIS — N390 Urinary tract infection, site not specified: Secondary | ICD-10-CM

## 2018-07-27 DIAGNOSIS — M170 Bilateral primary osteoarthritis of knee: Secondary | ICD-10-CM | POA: Diagnosis not present

## 2018-07-27 DIAGNOSIS — I10 Essential (primary) hypertension: Secondary | ICD-10-CM | POA: Diagnosis not present

## 2018-07-27 MED ORDER — BENAZEPRIL HCL 20 MG PO TABS: 20.0000 mg | ORAL_TABLET | Freq: Every day | ORAL | 3 refills | Status: DC

## 2018-07-27 MED ORDER — NITROFURANTOIN MONOHYD MACRO 100 MG PO CAPS: 100.0000 mg | ORAL_CAPSULE | Freq: Every day | ORAL | 3 refills | Status: DC

## 2018-07-27 NOTE — Progress Notes (Signed)
[facility] [place], [place] [address]  Internal MEDICINE  Office Visit Note  Patient Name: Sandra Davidson  [id number]  [id number]  Date of Service: 07/27/2018  Chief Complaint  Patient presents with  . Medical Management of Chronic Issues    discuss medications, pt concerned about blood pressure medication bringing blood pressure down too low. stated she has not taken any medications this morning.    The patient was seen per cardiology. She had been having swelling in her lower extremities. He d/c blood pressure medication and started her on spironolactone. She was only taking sprionalactone after that and blood pressure was running high. Her cardiologist did add back benazepril without the amlodipine. She has not taken this new combination.       Current Medication: Outpatient Encounter Medications as of 07/27/2018  Medication Sig Note  . ALPRAZolam (XANAX) 0.5 MG tablet Take 1 tablet (0.5 mg total) by mouth at bedtime as needed. for anxiety   . etodolac (LODINE) 400 MG tablet TAKE 1 TABLET BY MOUTH ONCE A DAY   . ibuprofen (ADVIL,MOTRIN) 600 MG tablet Take 1 tablet (600 mg total) by mouth every 8 (eight) hours as needed.   Marland Kitchen rOPINIRole (REQUIP) 1 MG tablet Take 1 tablet (1 mg total) by mouth at bedtime.   Marland Kitchen spironolactone (ALDACTONE) 25 MG tablet Take 25 mg by mouth daily.   . [DISCONTINUED] amLODipine-benazepril (LOTREL) 10-20 MG capsule Take 1 capsule by mouth daily. 07/27/2018: per cardiology  . benazepril (LOTENSIN) 20 MG tablet Take 1 tablet (20 mg total) by mouth daily.   . nitrofurantoin, macrocrystal-monohydrate, (MACROBID) 100 MG capsule Take 1 capsule (100 mg total) by mouth at bedtime.   . [DISCONTINUED] nitrofurantoin, macrocrystal-monohydrate, (MACROBID) 100 MG capsule Take 1 capsule (100 mg total) by mouth every 12 (twelve) hours. (Patient not taking: Reported on 07/27/2018)    No facility-administered encounter medications on file as of  07/27/2018.     Surgical History: Past Surgical History:  Procedure Laterality Date  . BACK SURGERY    . MOHS SURGERY    . NECK SURGERY    . TONSILLECTOMY      Medical History: Past Medical History:  Diagnosis Date  . Allergy   . Anxiety   . Arthritis   . Cancer ([place])    skin  . Depression   . GERD (gastroesophageal reflux disease)   . Hypertension   . Migraines     Family History: Family History  Problem Relation Age of Onset  . Congestive Heart Failure Mother   . COPD Mother   . Emphysema Mother   . Hypertension Mother   . Kidney disease Mother   . Cancer Father   . Kidney disease Father   . Bladder Cancer Neg Hx     Social History   Socioeconomic History  . Marital status: Divorced    Spouse name: Not on file  . Number of children: Not on file  . Years of education: Not on file  . Highest education level: Not on file  Occupational History  . Not on file  Social Needs  . Financial resource strain: Not on file  . Food insecurity:    Worry: Not on file    Inability: Not on file  . Transportation needs:    Medical: Not on file    Non-medical: Not on file  Tobacco Use  . Smoking status: Former Research scientist (life sciences)  . Smokeless tobacco: Never Used  Substance and Sexual Activity  .  Alcohol use: No  . Drug use: No  . Sexual activity: Not Currently  Lifestyle  . Physical activity:    Days per week: Not on file    Minutes per session: Not on file  . Stress: Not on file  Relationships  . Social connections:    Talks on phone: Not on file    Gets together: Not on file    Attends religious service: Not on file    Active member of club or organization: Not on file    Attends meetings of clubs or organizations: Not on file    Relationship status: Not on file  . Intimate partner violence:    Fear of current or ex partner: Not on file    Emotionally abused: Not on file    Physically abused: Not on file    Forced sexual activity: Not on file  Other Topics Concern   . Not on file  Social History Narrative  . Not on file      Review of Systems  Constitutional: Negative for activity change, appetite change, chills, diaphoresis, fatigue, fever and unexpected weight change.  HENT: Negative for congestion, postnasal drip, rhinorrhea, sneezing and sore throat.   Eyes: Negative.  Negative for redness.  Respiratory: Negative for cough, chest tightness, shortness of breath and wheezing.   Cardiovascular: Negative for chest pain and palpitations.       Elevated blood pressure  Gastrointestinal: Negative for abdominal pain, constipation, diarrhea, nausea and vomiting.  Endocrine: Negative for cold intolerance, heat intolerance, polydipsia, polyphagia and polyuria.  Genitourinary: Negative for dysuria, frequency, hematuria and urgency.  Musculoskeletal: Positive for arthralgias. Negative for back pain, joint swelling and neck pain.       Bilateral knee pain.   Skin: Negative for rash.  Allergic/Immunologic: Negative for environmental allergies.  Neurological: Positive for dizziness and headaches. Negative for tremors and numbness.  Hematological: Negative for adenopathy. Does not bruise/bleed easily.  Psychiatric/Behavioral: Negative for behavioral problems (Depression), sleep disturbance and suicidal ideas. The patient is nervous/anxious.     Today's Vitals   07/27/18 0847  BP: (!) 142/65  Pulse: 64  Resp: 16  SpO2: 98%  Weight: 135 lb 3.2 oz (61.3 kg)  Height: 5' 7.5" (1.715 m)    Physical Exam  Constitutional: She is oriented to person, place, and time. She appears well-developed and well-nourished. She appears ill. No distress.  HENT:  Head: Normocephalic and atraumatic.  Nose: Nose normal.  Mouth/Throat: Oropharynx is clear and moist. No oropharyngeal exudate.  Eyes: Pupils are equal, round, and reactive to light. Conjunctivae and EOM are normal.  Neck: Normal range of motion. Neck supple. No JVD present. Carotid bruit is not present. No  tracheal deviation present. No thyromegaly present.  Cardiovascular: Normal rate, regular rhythm and normal heart sounds. Exam reveals no gallop and no friction rub.  No murmur heard. Pulmonary/Chest: Effort normal and breath sounds normal. No respiratory distress. She has no wheezes. She has no rales. She exhibits no tenderness.  Abdominal: Soft. Bowel sounds are normal. There is no tenderness.  Musculoskeletal: Normal range of motion.  Lymphadenopathy:    She has no cervical adenopathy.  Neurological: She is alert and oriented to person, place, and time. No cranial nerve deficit.  Skin: Skin is warm and dry. Capillary refill takes 2 to 3 seconds. She is not diaphoretic.  Psychiatric: She has a normal mood and affect. Her behavior is normal. Judgment and thought content normal.  Nursing note and vitals reviewed.  Assessment/Plan:  1. Essential hypertension Reviewed cardiology note from last week. Continue spironolactone 25mg  daily. Add benazepril per cardiology but at 20mg  dose. Will monitor closely.  - benazepril (LOTENSIN) 20 MG tablet; Take 1 tablet (20 mg total) by mouth daily.  Dispense: 90 tablet; Refill: 3  2. Urinary tract infection without hematuria, site unspecified Will continue macrobid daily to prevent further infection. Keep appointment with urology as scheduled  - nitrofurantoin, macrocrystal-monohydrate, (MACROBID) 100 MG capsule; Take 1 capsule (100 mg total) by mouth at bedtime.  Dispense: 30 capsule; Refill: 3  3. Primary osteoarthritis of both knees Will check referral to orthopedics.   4. Restless leg syndrome, controlled Continue requip as prescribed.   General Counseling: Keala verbalizes understanding of the findings of todays visit and agrees with plan of treatment. I have discussed any further diagnostic evaluation that may be needed or ordered today. We also reviewed her medications today. she has been encouraged to call the office with any questions or concerns  that should arise related to todays visit.  Hypertension Counseling:   The following hypertensive lifestyle modification were recommended and discussed:  1. Limiting alcohol intake to less than 1 oz/day of ethanol:(24 oz of beer or 8 oz of wine or 2 oz of 100-proof whiskey). 2. Take baby ASA 81 mg daily. 3. Importance of regular aerobic exercise and losing weight. 4. Reduce dietary saturated fat and cholesterol intake for overall cardiovascular health. 5. Maintaining adequate dietary potassium, calcium, and magnesium intake. 6. Regular monitoring of the blood pressure. 7. Reduce sodium intake to less than 100 mmol/day (less than 2.3 gm of sodium or less than 6 gm of sodium choride)   This patient was seen by [place] with Dr Lavera Guise as a part of collaborative care agreement  Meds ordered this encounter  Medications  . nitrofurantoin, macrocrystal-monohydrate, (MACROBID) 100 MG capsule    Sig: Take 1 capsule (100 mg total) by mouth at bedtime.    Dispense:  30 capsule    Refill:  3    Order Specific Question:   Supervising Provider    Answer:   Lavera Guise [[id number]]  . benazepril (LOTENSIN) 20 MG tablet    Sig: Take 1 tablet (20 mg total) by mouth daily.    Dispense:  90 tablet    Refill:  3    Order Specific Question:   Supervising Provider    Answer:   Lavera Guise [[id number]]    Time spent: 33 Minutes      Dr Lavera Guise Internal medicine

## 2018-07-29 ENCOUNTER — Telehealth: Payer: Self-pay

## 2018-07-29 NOTE — Telephone Encounter (Signed)
Called patient and left voicemail to inform her that her appointment with [facility] has been scheduled for august 27th at 1:30 with Dr. Harlow Mares

## 2018-08-04 DIAGNOSIS — M11261 Other chondrocalcinosis, right knee: Secondary | ICD-10-CM | POA: Diagnosis not present

## 2018-08-04 DIAGNOSIS — M170 Bilateral primary osteoarthritis of knee: Secondary | ICD-10-CM | POA: Diagnosis not present

## 2018-08-04 DIAGNOSIS — M11262 Other chondrocalcinosis, left knee: Secondary | ICD-10-CM | POA: Diagnosis not present

## 2018-08-14 DIAGNOSIS — L570 Actinic keratosis: Secondary | ICD-10-CM | POA: Diagnosis not present

## 2018-08-14 DIAGNOSIS — L814 Other melanin hyperpigmentation: Secondary | ICD-10-CM | POA: Diagnosis not present

## 2018-08-14 DIAGNOSIS — L821 Other seborrheic keratosis: Secondary | ICD-10-CM | POA: Diagnosis not present

## 2018-08-24 ENCOUNTER — Ambulatory Visit (INDEPENDENT_AMBULATORY_CARE_PROVIDER_SITE_OTHER): Payer: PPO | Admitting: Urology

## 2018-08-24 ENCOUNTER — Encounter: Payer: Self-pay | Admitting: Urology

## 2018-08-24 VITALS — BP 104/64 | HR 64 | Ht 67.5 in | Wt 132.9 lb

## 2018-08-24 DIAGNOSIS — N3020 Other chronic cystitis without hematuria: Secondary | ICD-10-CM | POA: Diagnosis not present

## 2018-08-24 DIAGNOSIS — N390 Urinary tract infection, site not specified: Secondary | ICD-10-CM | POA: Diagnosis not present

## 2018-08-24 LAB — URINALYSIS, COMPLETE
BILIRUBIN UA: NEGATIVE
Glucose, UA: NEGATIVE
Ketones, UA: NEGATIVE
Nitrite, UA: NEGATIVE
PH UA: 5.5 (ref 5.0–7.5)
Protein, UA: NEGATIVE
Specific Gravity, UA: 1.02 (ref 1.005–1.030)
UUROB: 0.2 mg/dL (ref 0.2–1.0)

## 2018-08-24 LAB — MICROSCOPIC EXAMINATION

## 2018-08-24 MED ORDER — MIRABEGRON ER 50 MG PO TB24: 50.0000 mg | ORAL_TABLET | Freq: Every day | ORAL | 11 refills | Status: DC

## 2018-08-24 MED ORDER — NITROFURANTOIN MONOHYD MACRO 100 MG PO CAPS: 100.0000 mg | ORAL_CAPSULE | Freq: Every day | ORAL | 11 refills | Status: DC

## 2018-08-24 NOTE — Progress Notes (Signed)
08/24/2018 10:01 AM   Gideon 05/28/1936 [id number]  Referring provider: Ronnell Freshwater, NP [address] [place], [place] [address]  Chief Complaint  Patient presents with  . Cystitis    HPI: Was consulted to assess the patient's recurrent bladder infections.  Some of the details of the history were difficult to ascertain.  I tried to detect the number of infections that the patient has had in the last several months and she is been treated 5 times.  I do think her dysuria and incontinence of small-volume frequency at least temporarily get better when she is given an antibiotic but again was difficult to sort out  Sometimes she is a little bit urge incontinence.  She get up many times a night even 10 times.  She said that she forgot what was normal since of all her bladder infections.   Pathophysiology of chronic cystitis discussed.  It was difficult to sort out her history regarding baseline symptoms.  There is no question she has had many positive cultures.  Urine sent for culture today.  Start on prophylaxis.   On further discussion the patient has been on daily Macrodantin for about 8 weeks and reports positive cultures.  She said she does not tolerate sulfa or penicillin gets quite ill.  She understands that my treatment tools are somewhat limited.  I will call pending the culture results.  I was called in daily prophylaxis.  Reassess 8 weeks.  Order renal ultrasound to rule out stone  Today Frequency stable.  Last urine culture on daily Macrodantin negative.  Not having burning.  Urine sometimes foul-smelling.  Taking daily Macrodantin.  She still has some urgency incontinence.  At the how she changes her close but wears a pad when she goes out in public.  She thought she might want to try some medication but was considering living with the problem.  Renal ultrasound normal   PMH: Past Medical History:  Diagnosis Date  . Allergy   . Anxiety   . Arthritis   .  Cancer ([place])    skin  . Depression   . GERD (gastroesophageal reflux disease)   . Hypertension   . Migraines     Surgical History: Past Surgical History:  Procedure Laterality Date  . BACK SURGERY    . MOHS SURGERY    . NECK SURGERY    . TONSILLECTOMY      Home Medications:  Allergies as of 08/24/2018      Reactions   Codeine    Demerol [meperidine]    Doxycycline Nausea And Vomiting   U   Penicillins    Sulfa Antibiotics       Medication List        Accurate as of 08/24/18 10:01 AM. Always use your most recent med list.          ALPRAZolam 0.5 MG tablet Commonly known as:  XANAX Take 1 tablet (0.5 mg total) by mouth at bedtime as needed. for anxiety   benazepril 20 MG tablet Commonly known as:  LOTENSIN Take 1 tablet (20 mg total) by mouth daily.   etodolac 400 MG tablet Commonly known as:  LODINE TAKE 1 TABLET BY MOUTH ONCE A DAY   ibuprofen 600 MG tablet Commonly known as:  ADVIL,MOTRIN Take 1 tablet (600 mg total) by mouth every 8 (eight) hours as needed.   nitrofurantoin (macrocrystal-monohydrate) 100 MG capsule Commonly known as:  MACROBID Take 1 capsule (100 mg total) by mouth at  bedtime.   rOPINIRole 1 MG tablet Commonly known as:  REQUIP Take 1 tablet (1 mg total) by mouth at bedtime.   spironolactone 25 MG tablet Commonly known as:  ALDACTONE Take 25 mg by mouth daily.       Allergies:  Allergies  Allergen Reactions  . Codeine   . Demerol [Meperidine]   . Doxycycline Nausea And Vomiting    U  . Penicillins   . Sulfa Antibiotics     Family History: Family History  Problem Relation Age of Onset  . Congestive Heart Failure Mother   . COPD Mother   . Emphysema Mother   . Hypertension Mother   . Kidney disease Mother   . Cancer Father   . Kidney disease Father   . Bladder Cancer Neg Hx     Social History:  reports that she has quit smoking. She has never used smokeless tobacco. She reports that she does not drink alcohol or  use drugs.  ROS:                                        Physical Exam: BP 104/64 (BP Location: Left Arm, Patient Position: Sitting, Cuff Size: Normal)   Pulse 64   Ht 5' 7.5" (1.715 m)   Wt 132 lb 14.4 oz (60.3 kg)   BMI 20.51 kg/m   Constitutional:  Alert and oriented, No acute distress.  Laboratory Data: Lab Results  Component Value Date   WBC 6.9 04/06/2018   HGB 14.1 04/06/2018   HCT 40.8 04/06/2018   MCV 92.8 04/06/2018   PLT 229 04/06/2018    Lab Results  Component Value Date   CREATININE 1.36 (H) 04/06/2018    No results found for: PSA  No results found for: TESTOSTERONE  Lab Results  Component Value Date   HGBA1C 6.0 11/26/2017    Urinalysis    Component Value Date/Time   COLORURINE YELLOW (A) 04/06/2018 1332   APPEARANCEUR Clear 07/06/2018 1414   LABSPEC 1.012 04/06/2018 1332   PHURINE 5.0 04/06/2018 1332   GLUCOSEU Negative 07/06/2018 1414   HGBUR SMALL (A) 04/06/2018 1332   BILIRUBINUR Negative 07/06/2018 1414   KETONESUR NEGATIVE 04/06/2018 1332   PROTEINUR Negative 07/06/2018 1414   PROTEINUR NEGATIVE 04/06/2018 1332   UROBILINOGEN 0.2 04/21/2018 1111   NITRITE Negative 07/06/2018 1414   NITRITE NEGATIVE 04/06/2018 1332   LEUKOCYTESUR Trace (A) 07/06/2018 1414    Pertinent Imaging: Renal ultrasound normal  Assessment & Plan: Patient's work-up for chronic cystitis normal.  I will call if urine culture is positive.  She will come back in 6 weeks on Myrbetriq 50 mg samples and prescription.  Further therapy will depend on treatment goal  There are no diagnoses linked to this encounter.  No follow-ups on file.  Reece Packer, MD  [facility] [address], [place], [place] [phone]

## 2018-08-26 LAB — CULTURE, URINE COMPREHENSIVE

## 2018-09-01 ENCOUNTER — Telehealth: Payer: Self-pay

## 2018-09-01 NOTE — Telephone Encounter (Signed)
Called patient and left message and advised the headache clinic she had requested for a consult is not accepting her insurance at moment. Sandra Davidson

## 2018-09-03 ENCOUNTER — Ambulatory Visit (INDEPENDENT_AMBULATORY_CARE_PROVIDER_SITE_OTHER): Payer: PPO | Admitting: Nurse Practitioner

## 2018-09-03 ENCOUNTER — Ambulatory Visit
Admission: RE | Admit: 2018-09-03 | Discharge: 2018-09-03 | Disposition: A | Discharge status: Home / Self Care | Payer: PPO | Source: Ambulatory Visit | Attending: Nurse Practitioner | Admitting: Nurse Practitioner

## 2018-09-03 ENCOUNTER — Encounter: Payer: Self-pay | Admitting: Nurse Practitioner

## 2018-09-03 VITALS — BP 131/62 | HR 62 | Resp 16 | Ht 67.5 in | Wt 135.8 lb

## 2018-09-03 DIAGNOSIS — R7301 Impaired fasting glucose: Secondary | ICD-10-CM

## 2018-09-03 DIAGNOSIS — M79606 Pain in leg, unspecified: Secondary | ICD-10-CM | POA: Diagnosis not present

## 2018-09-03 DIAGNOSIS — M5417 Radiculopathy, lumbosacral region: Secondary | ICD-10-CM

## 2018-09-03 DIAGNOSIS — M5136 Other intervertebral disc degeneration, lumbar region: Secondary | ICD-10-CM | POA: Diagnosis not present

## 2018-09-03 DIAGNOSIS — I10 Essential (primary) hypertension: Secondary | ICD-10-CM

## 2018-09-03 LAB — POCT GLYCOSYLATED HEMOGLOBIN (HGB A1C): Hemoglobin A1C: 5.8 % — AB (ref 4.0–5.6)

## 2018-09-03 NOTE — Progress Notes (Signed)
[facility] [place], [place] [address]  Internal MEDICINE  Office Visit Note  Patient Name: Sandra Davidson  [id number]  [id number]  Date of Service: 09/03/2018   Pt is here for a sick visit.  Chief Complaint  Patient presents with  . Medical Management of Chronic Issues    pt stated she has been vey dizzy and has noticed her blood pressure has not been in her normal range since the change of medication  . Pain    legs and feet are still giving her problems. pt stated injections in knees helped   . Diabetes    pt wants to get her A1C checked.      Patient c/o bilateral lower leg pain. Legs often feel cold and sometimes numb. Notices that they sometimes change color. Have been blue at times. They are painful most of the time. She has seen vascular doctor in the past, told her that she did not have PAD. Has good pulses. She has had two lower extremity doppler studies done int the past, both with no evidence of DVT. Was wanting to have referral to [facility] wellness center to have nerve conduction study done on her legs. She has had sugery done on her lower back and, though has little back pain, is concerned that this surgery may be affecting how her legs feel.  She is also concerned about blood sugars. has been borderline diabetic in the past and concerned that elevated blood sugars may be contributing to the pain in her legs.       Current Medication:  Outpatient Encounter Medications as of 09/03/2018  Medication Sig  . ALPRAZolam (XANAX) 0.5 MG tablet Take 1 tablet (0.5 mg total) by mouth at bedtime as needed. for anxiety  . benazepril (LOTENSIN) 20 MG tablet Take 1 tablet (20 mg total) by mouth daily.  Marland Kitchen etodolac (LODINE) 400 MG tablet TAKE 1 TABLET BY MOUTH ONCE A DAY  . ibuprofen (ADVIL,MOTRIN) 600 MG tablet Take 1 tablet (600 mg total) by mouth every 8 (eight) hours as needed.  . mirabegron ER (MYRBETRIQ) 50 MG TB24 tablet Take 1 tablet (50 mg total)  by mouth daily.  . nitrofurantoin, macrocrystal-monohydrate, (MACROBID) 100 MG capsule Take 1 capsule (100 mg total) by mouth at bedtime.  Marland Kitchen rOPINIRole (REQUIP) 1 MG tablet Take 1 tablet (1 mg total) by mouth at bedtime.  Marland Kitchen spironolactone (ALDACTONE) 25 MG tablet Take 25 mg by mouth daily.   No facility-administered encounter medications on file as of 09/03/2018.       Medical History: Past Medical History:  Diagnosis Date  . Allergy   . Anxiety   . Arthritis   . Cancer ([place])    skin  . Depression   . GERD (gastroesophageal reflux disease)   . Hypertension   . Migraines      Vital Signs: BP 131/62 (BP Location: Left Arm, Patient Position: Sitting, Cuff Size: Normal)   Pulse 62   Resp 16   Ht 5' 7.5" (1.715 m)   Wt 135 lb 12.8 oz (61.6 kg)   SpO2 98%   BMI 20.96 kg/m    Review of Systems  Constitutional: Positive for fatigue. Negative for activity change, appetite change, chills, diaphoresis, fever and unexpected weight change.  HENT: Negative for congestion, postnasal drip, rhinorrhea, sneezing and sore throat.   Eyes: Negative.  Negative for redness.  Respiratory: Negative for cough, chest tightness, shortness of breath and wheezing.   Cardiovascular: Negative for chest  pain and palpitations.       Leg pani  Gastrointestinal: Negative for abdominal pain, constipation, diarrhea, nausea and vomiting.  Endocrine: Negative for cold intolerance, heat intolerance, polydipsia, polyphagia and polyuria.  Genitourinary: Negative for dysuria, frequency, hematuria and urgency.  Musculoskeletal: Negative for arthralgias, back pain, joint swelling and neck pain.  Skin: Negative for rash.  Allergic/Immunologic: Negative for environmental allergies.  Neurological: Positive for dizziness and headaches. Negative for tremors and numbness.  Hematological: Negative for adenopathy. Does not bruise/bleed easily.  Psychiatric/Behavioral: Negative for behavioral problems (Depression), sleep  disturbance and suicidal ideas. The patient is nervous/anxious.     Physical Exam  Constitutional: She is oriented to person, place, and time. She appears well-developed and well-nourished. She appears ill. No distress.  HENT:  Head: Normocephalic and atraumatic.  Nose: Nose normal.  Mouth/Throat: Oropharynx is clear and moist. No oropharyngeal exudate.  Eyes: Pupils are equal, round, and reactive to light. Conjunctivae and EOM are normal.  Neck: Normal range of motion. Neck supple. No JVD present. Carotid bruit is not present. No tracheal deviation present. No thyromegaly present.  Cardiovascular: Normal rate, regular rhythm, normal heart sounds and intact distal pulses. Exam reveals no gallop and no friction rub.  No murmur heard. Bilateral leg pain, lower extremities feel cool to the touch. Pulses are palpable, bilaterally. She has good capillary refill. No edema is present in either foot or lower leg.   Pulmonary/Chest: Effort normal and breath sounds normal. No respiratory distress. She has no wheezes. She has no rales. She exhibits no tenderness.  Abdominal: Soft. Bowel sounds are normal. There is no tenderness.  Musculoskeletal: Normal range of motion.  Lymphadenopathy:    She has no cervical adenopathy.  Neurological: She is alert and oriented to person, place, and time. No cranial nerve deficit.  Skin: Skin is warm and dry. Capillary refill takes 2 to 3 seconds. She is not diaphoretic.  Skin of bilateral lower extremities is cool to touch.   Psychiatric: She has a normal mood and affect. Her behavior is normal. Judgment and thought content normal.  Nursing note and vitals reviewed.  Assessment/Plan: 1. Impaired fasting glucose - POCT HgB A1C 5.8 today and controlled through diet. Will continue to monitor.   2. Lower extremity pain, diffuse, unspecified laterality Extremities cool and tender to palpate. Will get venous ultrasound to evaluate for venous insufficiency/reflux.  -  VAS [place] LOWER EXTREMITY VENOUS REFLUX; Future  3. Lumbosacral radiculopathy due to intervertebral disc disorder Prior surgical procedures on lumbar spine may be contributing to symptoms. Will x-ray lower back for further evaluation.  - DG Lumbar Spine Complete; Future  4. Essential hypertension Currently well managed. Suggested she try taking medication at night to help fatigue/sleepiness caused by medication.   General Counseling: Sandra Davidson verbalizes understanding of the findings of todays visit and agrees with plan of treatment. I have discussed any further diagnostic evaluation that may be needed or ordered today. We also reviewed her medications today. she has been encouraged to call the office with any questions or concerns that should arise related to todays visit.    Counseling:  Hypertension Counseling:   The following hypertensive lifestyle modification were recommended and discussed:  1. Limiting alcohol intake to less than 1 oz/day of ethanol:(24 oz of beer or 8 oz of wine or 2 oz of 100-proof whiskey). 2. Take baby ASA 81 mg daily. 3. Importance of regular aerobic exercise and losing weight. 4. Reduce dietary saturated fat and cholesterol intake for overall  cardiovascular health. 5. Maintaining adequate dietary potassium, calcium, and magnesium intake. 6. Regular monitoring of the blood pressure. 7. Reduce sodium intake to less than 100 mmol/day (less than 2.3 gm of sodium or less than 6 gm of sodium choride)   This patient was seen by [place] with Dr Lavera Guise as a part of collaborative care agreement  Orders Placed This Encounter  Procedures  . DG Lumbar Spine Complete  . POCT HgB A1C      Time spent: 15 Minutes

## 2018-09-04 ENCOUNTER — Telehealth: Payer: Self-pay

## 2018-09-04 NOTE — Telephone Encounter (Signed)
Tried calling pt and did not get an answer on either number. Was not able to leave a voicemail

## 2018-09-04 NOTE — Telephone Encounter (Signed)
-----   Message from Corlis Hove sent at 09/04/2018 11:54 AM EDT -----   ----- Message ----- From: Ronnell Freshwater, NP Sent: 09/03/2018   5:55 PM EDT To: Corlis Hove  Please let the patient know that the x-ray of her lumbar spine is showing severe degenerative disc disease at L4/L5  And L5;S1 levels and mild degenerative disease at L3/L4. With this information, i'd like to go ahead and refer her to neuro and spine. May be able to to the nerve conduction study based on this. This may be contributing to the symptoms she is having in her legs. i'd still like her to have the ordered ultrasound. Thanks Please let Beth know about the referral. Thanks .

## 2018-09-11 ENCOUNTER — Ambulatory Visit: Payer: PPO

## 2018-09-11 DIAGNOSIS — M79606 Pain in leg, unspecified: Secondary | ICD-10-CM

## 2018-10-01 ENCOUNTER — Ambulatory Visit (INDEPENDENT_AMBULATORY_CARE_PROVIDER_SITE_OTHER): Payer: PPO | Admitting: Nurse Practitioner

## 2018-10-01 ENCOUNTER — Encounter: Payer: Self-pay | Admitting: Nurse Practitioner

## 2018-10-01 VITALS — BP 145/63 | HR 68 | Resp 16 | Ht 67.5 in | Wt 136.0 lb

## 2018-10-01 DIAGNOSIS — M79606 Pain in leg, unspecified: Secondary | ICD-10-CM | POA: Diagnosis not present

## 2018-10-01 DIAGNOSIS — M5417 Radiculopathy, lumbosacral region: Secondary | ICD-10-CM | POA: Diagnosis not present

## 2018-10-01 DIAGNOSIS — I10 Essential (primary) hypertension: Secondary | ICD-10-CM | POA: Diagnosis not present

## 2018-10-01 DIAGNOSIS — G2581 Restless legs syndrome: Secondary | ICD-10-CM | POA: Diagnosis not present

## 2018-10-01 MED ORDER — IBUPROFEN 600 MG PO TABS: 600.0000 mg | ORAL_TABLET | Freq: Three times a day (TID) | ORAL | 3 refills | Status: DC | PRN

## 2018-10-01 MED ORDER — ROPINIROLE HCL 1 MG PO TABS: ORAL_TABLET | ORAL | 3 refills | Status: DC

## 2018-10-01 NOTE — Progress Notes (Signed)
[facility] [place], [place] [address]  Internal MEDICINE  Office Visit Note  Patient Name: Sandra Davidson  [id number]  [id number]  Date of Service: 10/04/2018  Chief Complaint  Patient presents with  . Medical Management of Chronic Issues    4wk follow up  . Labs Only    review ultrasound and xray    The patient continues to have lower leg pain and numbness in the feet. Cramping in her lower legs is especially bad at night. She currently takes ropinerole 1mg  at bedtime which helps just a little. She did have lumbar spine x-ray did show moderate degenerative arthritis in the lumbar spine. Also has moderate and persistent pain in the low back.  Knee pain is much better. She has seen orthopedic provider. Had cortisone injections into both knees. Has reduced pain and much improved ROM in the knees.        Current Medication: Outpatient Encounter Medications as of 10/01/2018  Medication Sig  . ALPRAZolam (XANAX) 0.5 MG tablet Take 1 tablet (0.5 mg total) by mouth at bedtime as needed. for anxiety  . benazepril (LOTENSIN) 20 MG tablet Take 1 tablet (20 mg total) by mouth daily.  Marland Kitchen etodolac (LODINE) 400 MG tablet TAKE 1 TABLET BY MOUTH ONCE A DAY  . ibuprofen (ADVIL,MOTRIN) 600 MG tablet Take 1 tablet (600 mg total) by mouth every 8 (eight) hours as needed.  . mirabegron ER (MYRBETRIQ) 50 MG TB24 tablet Take 1 tablet (50 mg total) by mouth daily.  . nitrofurantoin, macrocrystal-monohydrate, (MACROBID) 100 MG capsule Take 1 capsule (100 mg total) by mouth at bedtime.  Marland Kitchen rOPINIRole (REQUIP) 1 MG tablet Take 1 to 2 tablets po QHS prn restless legs .  . spironolactone (ALDACTONE) 25 MG tablet Take 25 mg by mouth daily.  . [DISCONTINUED] ibuprofen (ADVIL,MOTRIN) 600 MG tablet Take 1 tablet (600 mg total) by mouth every 8 (eight) hours as needed.  . [DISCONTINUED] rOPINIRole (REQUIP) 1 MG tablet Take 1 tablet (1 mg total) by mouth at bedtime.   No  facility-administered encounter medications on file as of 10/01/2018.     Surgical History: Past Surgical History:  Procedure Laterality Date  . BACK SURGERY    . MOHS SURGERY    . NECK SURGERY    . TONSILLECTOMY      Medical History: Past Medical History:  Diagnosis Date  . Allergy   . Anxiety   . Arthritis   . Cancer ([place])    skin  . Depression   . GERD (gastroesophageal reflux disease)   . Hypertension   . Migraines     Family History: Family History  Problem Relation Age of Onset  . Congestive Heart Failure Mother   . COPD Mother   . Emphysema Mother   . Hypertension Mother   . Kidney disease Mother   . Cancer Father   . Kidney disease Father   . Bladder Cancer Neg Hx     Social History   Socioeconomic History  . Marital status: Divorced    Spouse name: Not on file  . Number of children: Not on file  . Years of education: Not on file  . Highest education level: Not on file  Occupational History  . Not on file  Social Needs  . Financial resource strain: Not on file  . Food insecurity:    Worry: Not on file    Inability: Not on file  . Transportation needs:    Medical: Not  on file    Non-medical: Not on file  Tobacco Use  . Smoking status: Former Research scientist (life sciences)  . Smokeless tobacco: Never Used  Substance and Sexual Activity  . Alcohol use: No  . Drug use: No  . Sexual activity: Not Currently  Lifestyle  . Physical activity:    Days per week: Not on file    Minutes per session: Not on file  . Stress: Not on file  Relationships  . Social connections:    Talks on phone: Not on file    Gets together: Not on file    Attends religious service: Not on file    Active member of club or organization: Not on file    Attends meetings of clubs or organizations: Not on file    Relationship status: Not on file  . Intimate partner violence:    Fear of current or ex partner: Not on file    Emotionally abused: Not on file    Physically abused: Not on file     Forced sexual activity: Not on file  Other Topics Concern  . Not on file  Social History Narrative  . Not on file      Review of Systems  Constitutional: Negative for activity change, appetite change, chills, diaphoresis, fatigue, fever and unexpected weight change.  HENT: Negative for congestion, postnasal drip, rhinorrhea, sneezing and sore throat.   Eyes: Negative.  Negative for redness.  Respiratory: Negative for cough, chest tightness, shortness of breath and wheezing.   Cardiovascular: Negative for chest pain and palpitations.       Leg pain  Gastrointestinal: Negative for abdominal pain, constipation, diarrhea, nausea and vomiting.  Endocrine: Negative for cold intolerance, heat intolerance, polydipsia, polyphagia and polyuria.  Genitourinary: Negative for dysuria, frequency, hematuria and urgency.  Musculoskeletal: Positive for arthralgias. Negative for back pain, joint swelling and neck pain.       Improved knee pain.  Skin: Negative for rash.  Allergic/Immunologic: Negative for environmental allergies.  Neurological: Positive for dizziness. Negative for tremors, numbness and headaches.  Hematological: Negative for adenopathy. Does not bruise/bleed easily.  Psychiatric/Behavioral: Negative for behavioral problems (Depression), sleep disturbance and suicidal ideas. The patient is nervous/anxious.     Today's Vitals   10/01/18 1153  BP: (!) 145/63  Pulse: 68  Resp: 16  SpO2: 96%  Weight: 136 lb (61.7 kg)  Height: 5' 7.5" (1.715 m)    Physical Exam  Constitutional: She is oriented to person, place, and time. She appears well-developed and well-nourished. She appears ill. No distress.  HENT:  Head: Normocephalic and atraumatic.  Nose: Nose normal.  Mouth/Throat: Oropharynx is clear and moist. No oropharyngeal exudate.  Eyes: Pupils are equal, round, and reactive to light. Conjunctivae and EOM are normal.  Neck: Normal range of motion. Neck supple. No JVD present.  Carotid bruit is not present. No tracheal deviation present. No thyromegaly present.  Cardiovascular: Normal rate, regular rhythm, normal heart sounds and intact distal pulses. Exam reveals no gallop and no friction rub.  No murmur heard. Bilateral leg pain, lower extremities feel cool to the touch. Pulses are palpable, bilaterally. She has good capillary refill. No edema is present in either foot or lower leg.   Pulmonary/Chest: Effort normal and breath sounds normal. No respiratory distress. She has no wheezes. She has no rales. She exhibits no tenderness.  Abdominal: Soft. Bowel sounds are normal. There is no tenderness.  Musculoskeletal: Normal range of motion.  Lymphadenopathy:    She has no cervical adenopathy.  Neurological: She is alert and oriented to person, place, and time. No cranial nerve deficit.  Skin: Skin is warm and dry. Capillary refill takes 2 to 3 seconds. She is not diaphoretic.  Skin of bilateral lower extremities is cool to touch.   Psychiatric: She has a normal mood and affect. Her behavior is normal. Judgment and thought content normal.  Nursing note and vitals reviewed.  Assessment/Plan: 1. Lumbosacral radiculopathy due to intervertebral disc disorder Reviewed results of lumbar spine x-ray with the patient. Did show diffuse degenerative disc disease. Add ibuprofen 6.mammogram which may be taken up to three times daily as needed for pain/inflammation. Consider referral to orthopedics/neurosurgery in the future as indicated.  - ibuprofen (ADVIL,MOTRIN) 600 MG tablet; Take 1 tablet (600 mg total) by mouth every 8 (eight) hours as needed.  Dispense: 30 tablet; Refill: 3  2. Restless leg syndrome, controlled Increase ropinerole 1mg , taking one to two tablets every evening to help with leg pain/cramping. - rOPINIRole (REQUIP) 1 MG tablet; Take 1 to 2 tablets po QHS prn restless legs .  Dispense: 60 tablet; Refill: 3  3. Lower extremity pain, diffuse, unspecified  laterality Reviewed lower extremity ultrasound results, showing no DVT or evidence of blood clots. Patient does have some venous insufficiency. She is wearing compression socks. Will see vein and vasculat in the further as needed.   4. Essential hypertension Stable. No changes made to BP medication today.   General Counseling: Zunaira verbalizes understanding of the findings of todays visit and agrees with plan of treatment. I have discussed any further diagnostic evaluation that may be needed or ordered today. We also reviewed her medications today. she has been encouraged to call the office with any questions or concerns that should arise related to todays visit.  Hypertension Counseling:   The following hypertensive lifestyle modification were recommended and discussed:  1. Limiting alcohol intake to less than 1 oz/day of ethanol:(24 oz of beer or 8 oz of wine or 2 oz of 100-proof whiskey). 2. Take baby ASA 81 mg daily. 3. Importance of regular aerobic exercise and losing weight. 4. Reduce dietary saturated fat and cholesterol intake for overall cardiovascular health. 5. Maintaining adequate dietary potassium, calcium, and magnesium intake. 6. Regular monitoring of the blood pressure. 7. Reduce sodium intake to less than 100 mmol/day (less than 2.3 gm of sodium or less than 6 gm of sodium choride)   This patient was seen by [place] with Dr Lavera Guise as a part of collaborative care agreement  Meds ordered this encounter  Medications  . rOPINIRole (REQUIP) 1 MG tablet    Sig: Take 1 to 2 tablets po QHS prn restless legs .    Dispense:  60 tablet    Refill:  3    Please note increased dose    Order Specific Question:   Supervising Provider    Answer:   Lavera Guise [[id number]]  . ibuprofen (ADVIL,MOTRIN) 600 MG tablet    Sig: Take 1 tablet (600 mg total) by mouth every 8 (eight) hours as needed.    Dispense:  30 tablet    Refill:  3    Order Specific Question:    Supervising Provider    Answer:   Lavera Guise [[id number]]    Time spent: 50 Minutes      Dr Lavera Guise Internal medicine

## 2018-10-05 ENCOUNTER — Ambulatory Visit: Payer: PPO | Admitting: Urology

## 2018-10-29 IMAGING — DX DG CHEST 2V
2 series · 2 of 2 positions shown · non-contrast
Comparison: 01/02/2018

CLINICAL DATA: Chest pain. Ill-defined right lung densities on
previous chest x-ray

EXAM:
CHEST  2 VIEW

[chest pa]
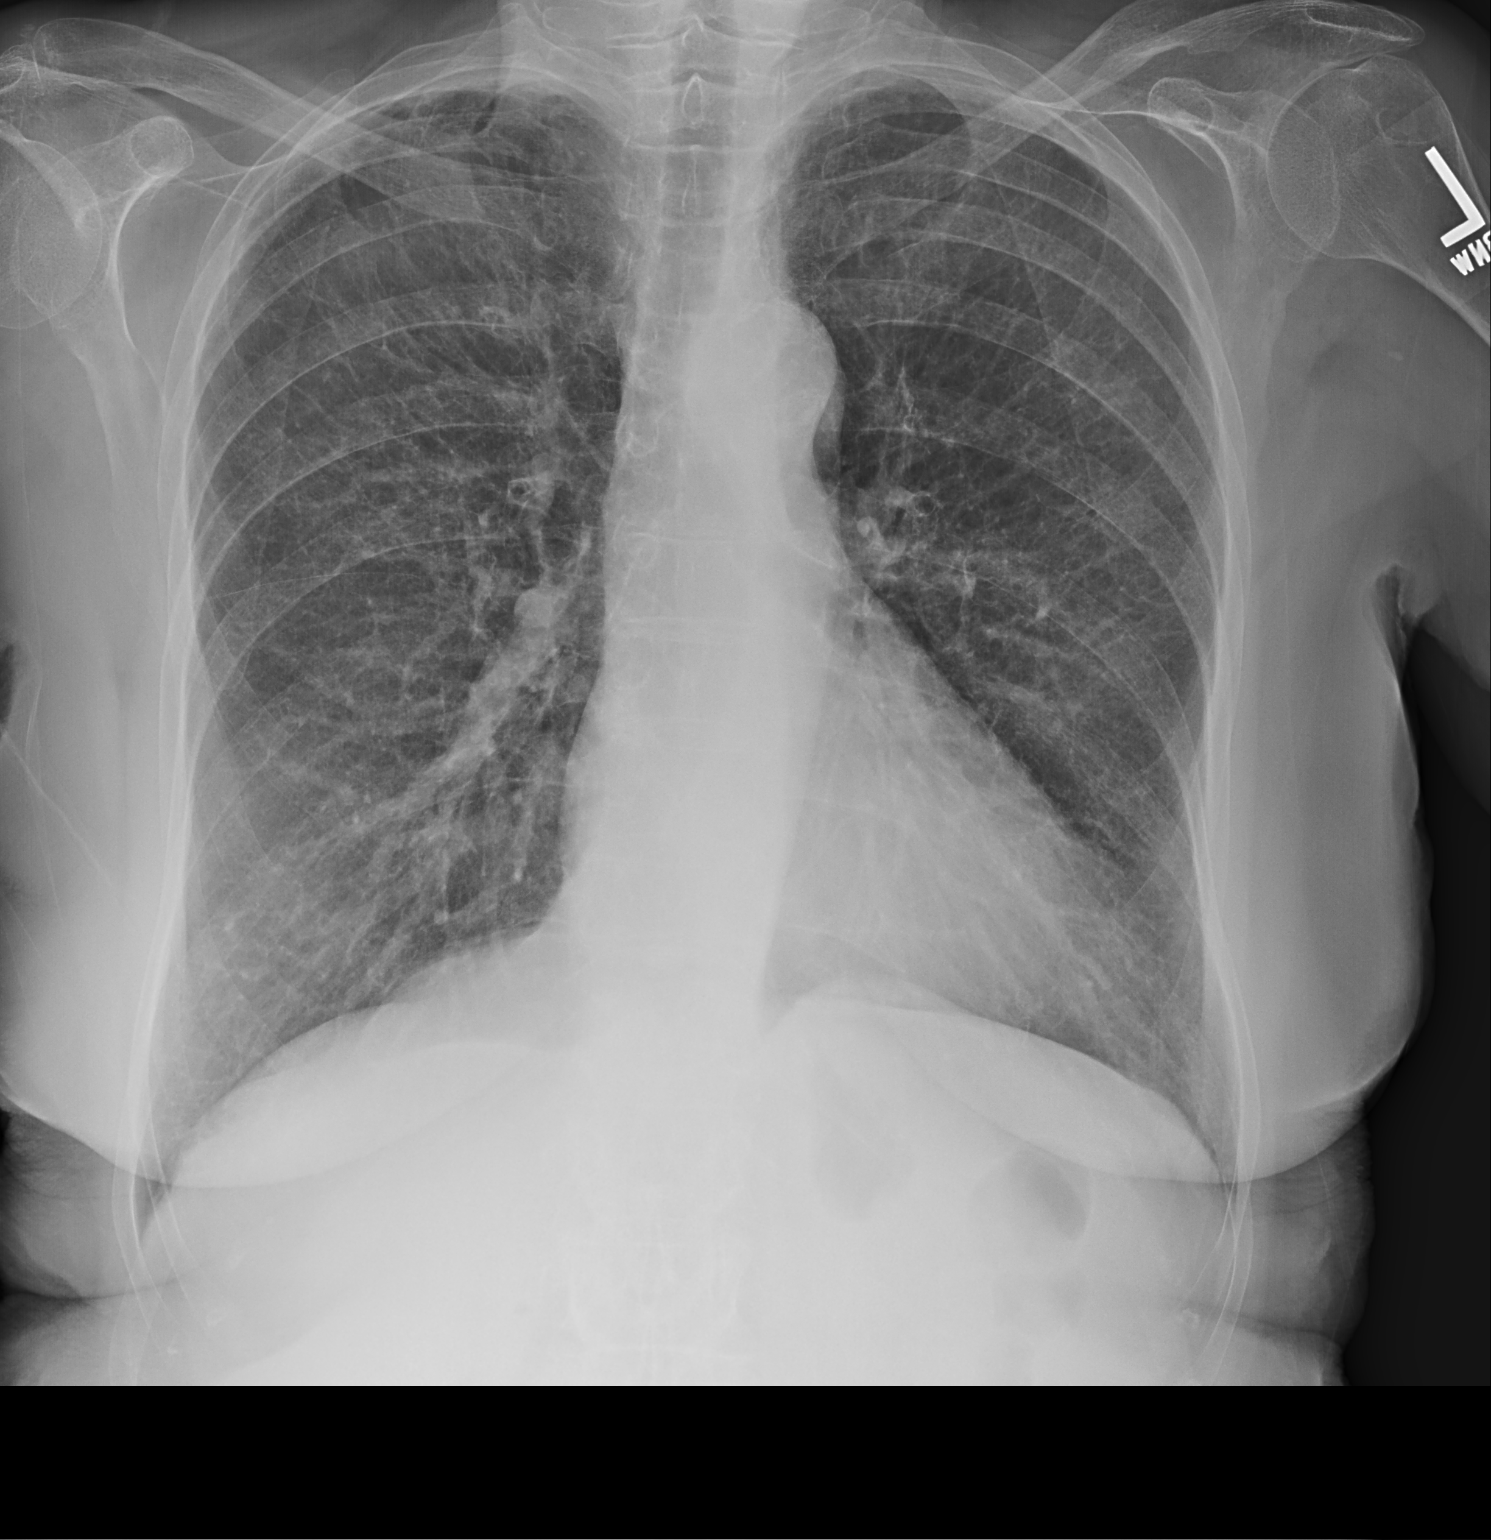

[chest lat]
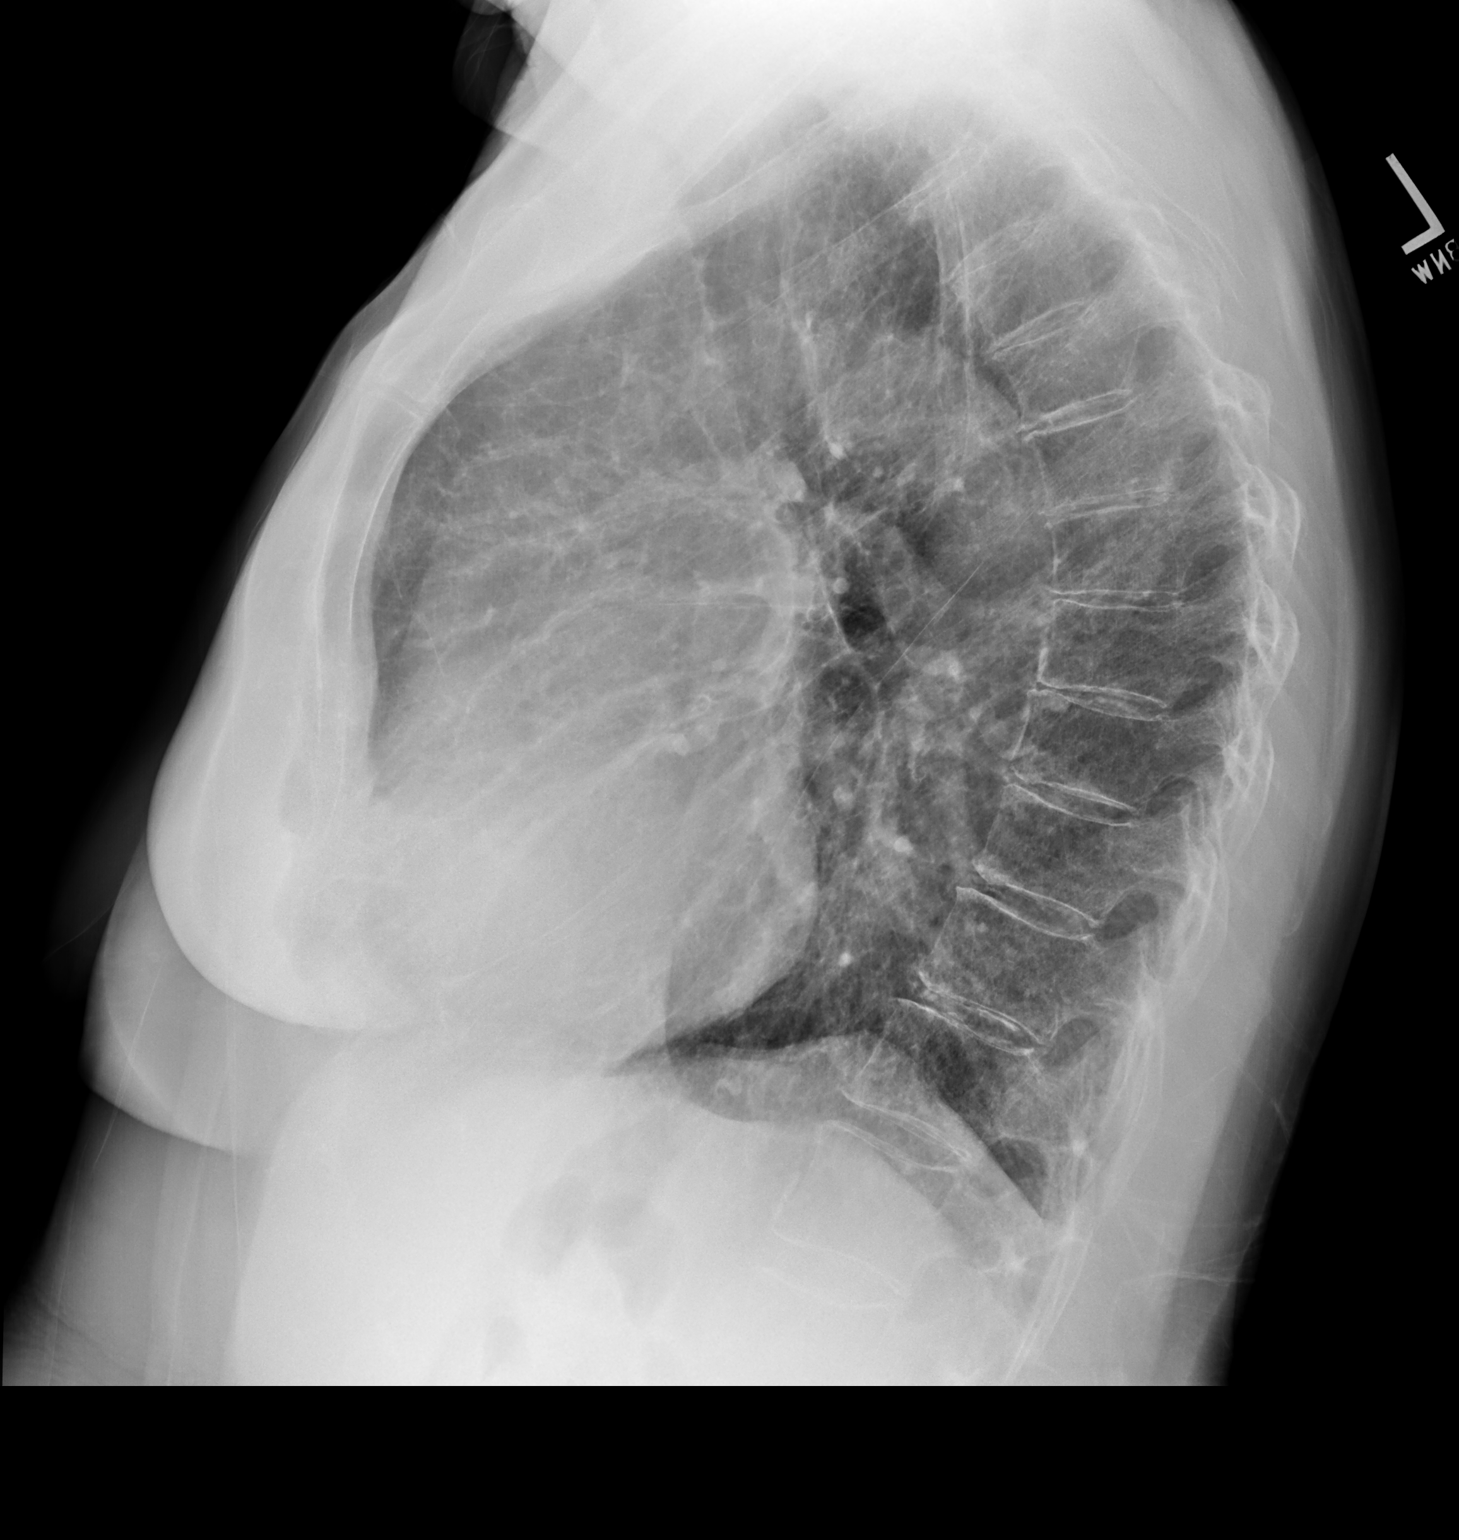

[2 of 2 positions shown; findings below may reference images not displayed]

FINDINGS: Lungs are hyperexpanded. Interstitial markings are diffusely
coarsened with chronic features. Vague nodular densities on the
prior study are not readily evident on today's study. No focal
airspace consolidation, pulmonary edema, or pleural effusion.
Cardiopericardial silhouette is at upper limits of normal for size.
The visualized bony structures of the thorax are intact.
IMPRESSION: 1. Right upper lung nodular density seen on the previous study not
readily appreciated on the current study.
2. Hyperexpansion with chronic interstitial changes suggests
underlying emphysema.

## 2018-11-02 ENCOUNTER — Telehealth: Payer: Self-pay | Admitting: Nurse Practitioner

## 2018-11-02 ENCOUNTER — Ambulatory Visit: Payer: Self-pay | Admitting: Nurse Practitioner

## 2018-11-03 ENCOUNTER — Other Ambulatory Visit: Payer: Self-pay | Admitting: Nurse Practitioner

## 2018-11-03 DIAGNOSIS — F411 Generalized anxiety disorder: Secondary | ICD-10-CM

## 2018-11-03 MED ORDER — ALPRAZOLAM 0.5 MG PO TABS: 0.5000 mg | ORAL_TABLET | Freq: Every evening | ORAL | 3 refills | Status: DC | PRN

## 2018-11-03 NOTE — Progress Notes (Signed)
Refilled patients current prescription or alprazolam 0.5mg  at bedtime as needed. New rx sent to [facility] per request.

## 2018-11-03 NOTE — Telephone Encounter (Signed)
done

## 2019-01-04 ENCOUNTER — Encounter: Payer: Self-pay | Admitting: Nurse Practitioner

## 2019-01-04 ENCOUNTER — Ambulatory Visit (INDEPENDENT_AMBULATORY_CARE_PROVIDER_SITE_OTHER): Payer: PPO | Admitting: Nurse Practitioner

## 2019-01-04 VITALS — BP 120/60 | HR 62 | Resp 16 | Ht 67.5 in | Wt 132.0 lb

## 2019-01-04 DIAGNOSIS — F411 Generalized anxiety disorder: Secondary | ICD-10-CM

## 2019-01-04 DIAGNOSIS — M5417 Radiculopathy, lumbosacral region: Secondary | ICD-10-CM

## 2019-01-04 DIAGNOSIS — G2581 Restless legs syndrome: Secondary | ICD-10-CM | POA: Diagnosis not present

## 2019-01-04 DIAGNOSIS — I10 Essential (primary) hypertension: Secondary | ICD-10-CM | POA: Diagnosis not present

## 2019-01-04 MED ORDER — ALPRAZOLAM 0.5 MG PO TABS: 0.5000 mg | ORAL_TABLET | Freq: Every day | ORAL | 3 refills | Status: DC | PRN

## 2019-01-04 NOTE — Progress Notes (Signed)
[facility] [place], [place] [address]  Internal MEDICINE  Office Visit Note  Patient Name: Sandra Davidson  [id number]  [id number]  Date of Service: 01/13/2019  Chief Complaint  Patient presents with  . Anxiety  . Hypertension    The patient continues to have lower leg pain and numbness in the feet. Cramping in her lower legs is especially bad at night. She currently takes ropinerole 1mg  at bedtime which helps just a little. This was increased to one to two tablets at bedtime which is helping her sleep much better. Leg jerking has improved.       Current Medication: Outpatient Encounter Medications as of 01/04/2019  Medication Sig  . ALPRAZolam (XANAX) 0.5 MG tablet Take 1 tablet (0.5 mg total) by mouth daily as needed for anxiety. for anxiety  . benazepril (LOTENSIN) 20 MG tablet Take 1 tablet (20 mg total) by mouth daily.  Marland Kitchen etodolac (LODINE) 400 MG tablet TAKE 1 TABLET BY MOUTH ONCE A DAY  . ibuprofen (ADVIL,MOTRIN) 600 MG tablet Take 1 tablet (600 mg total) by mouth every 8 (eight) hours as needed.  . nitrofurantoin, macrocrystal-monohydrate, (MACROBID) 100 MG capsule Take 1 capsule (100 mg total) by mouth at bedtime.  Marland Kitchen rOPINIRole (REQUIP) 1 MG tablet Take 1 to 2 tablets po QHS prn restless legs .  . spironolactone (ALDACTONE) 25 MG tablet Take 25 mg by mouth daily.  . [DISCONTINUED] ALPRAZolam (XANAX) 0.5 MG tablet Take 1 tablet (0.5 mg total) by mouth at bedtime as needed. for anxiety  . mirabegron ER (MYRBETRIQ) 50 MG TB24 tablet Take 1 tablet (50 mg total) by mouth daily. (Patient not taking: Reported on 01/04/2019)   No facility-administered encounter medications on file as of 01/04/2019.     Surgical History: Past Surgical History:  Procedure Laterality Date  . BACK SURGERY    . MOHS SURGERY    . NECK SURGERY    . TONSILLECTOMY      Medical History: Past Medical History:  Diagnosis Date  . Allergy   . Anxiety   . Arthritis   .  Cancer ([place])    skin  . Depression   . GERD (gastroesophageal reflux disease)   . Hypertension   . Migraines     Family History: Family History  Problem Relation Age of Onset  . Congestive Heart Failure Mother   . COPD Mother   . Emphysema Mother   . Hypertension Mother   . Kidney disease Mother   . Cancer Father   . Kidney disease Father   . Bladder Cancer Neg Hx     Social History   Socioeconomic History  . Marital status: Divorced    Spouse name: Not on file  . Number of children: Not on file  . Years of education: Not on file  . Highest education level: Not on file  Occupational History  . Not on file  Social Needs  . Financial resource strain: Not on file  . Food insecurity:    Worry: Not on file    Inability: Not on file  . Transportation needs:    Medical: Not on file    Non-medical: Not on file  Tobacco Use  . Smoking status: Former Research scientist (life sciences)  . Smokeless tobacco: Never Used  Substance and Sexual Activity  . Alcohol use: No  . Drug use: No  . Sexual activity: Not Currently  Lifestyle  . Physical activity:    Days per week: Not on file  Minutes per session: Not on file  . Stress: Not on file  Relationships  . Social connections:    Talks on phone: Not on file    Gets together: Not on file    Attends religious service: Not on file    Active member of club or organization: Not on file    Attends meetings of clubs or organizations: Not on file    Relationship status: Not on file  . Intimate partner violence:    Fear of current or ex partner: Not on file    Emotionally abused: Not on file    Physically abused: Not on file    Forced sexual activity: Not on file  Other Topics Concern  . Not on file  Social History Narrative  . Not on file      Review of Systems  Constitutional: Negative for activity change, fatigue and unexpected weight change.  HENT: Negative for congestion, postnasal drip, rhinorrhea, sneezing and sore throat.    Respiratory: Negative for cough, chest tightness, shortness of breath and wheezing.   Cardiovascular: Negative for chest pain and palpitations.       Leg pain which is improving.  Gastrointestinal: Negative for abdominal pain, constipation, diarrhea, nausea and vomiting.  Endocrine: Negative for cold intolerance, heat intolerance, polydipsia and polyuria.  Musculoskeletal: Positive for arthralgias. Negative for back pain, joint swelling and neck pain.       Improved knee pain.  Skin: Negative for rash.  Allergic/Immunologic: Negative for environmental allergies.  Neurological: Positive for dizziness. Negative for tremors, numbness and headaches.  Hematological: Negative for adenopathy. Does not bruise/bleed easily.  Psychiatric/Behavioral: Negative for behavioral problems (Depression), sleep disturbance and suicidal ideas. The patient is nervous/anxious.     Today's Vitals   01/04/19 1146  BP: 120/60  Pulse: 62  Resp: 16  SpO2: 98%  Weight: 132 lb (59.9 kg)  Height: 5' 7.5" (1.715 m)   Body mass index is 20.37 kg/m.  Physical Exam Vitals signs and nursing note reviewed.  Constitutional:      General: She is not in acute distress.    Appearance: Normal appearance. She is well-developed. She is not diaphoretic.  HENT:     Head: Normocephalic and atraumatic.     Nose: Nose normal.     Mouth/Throat:     Pharynx: No oropharyngeal exudate.  Eyes:     Conjunctiva/sclera: Conjunctivae normal.     Pupils: Pupils are equal, round, and reactive to light.  Neck:     Musculoskeletal: Normal range of motion and neck supple.     Thyroid: No thyromegaly.     Vascular: No carotid bruit or JVD.     Trachea: No tracheal deviation.  Cardiovascular:     Rate and Rhythm: Normal rate and regular rhythm.     Heart sounds: Normal heart sounds. No murmur. No friction rub. No gallop.      Comments: Bilateral leg pain, lower extremities feel cool to the touch. Pulses are palpable, bilaterally.  She has good capillary refill. No edema is present in either foot or lower leg.  Pulmonary:     Effort: Pulmonary effort is normal. No respiratory distress.     Breath sounds: Normal breath sounds. No wheezing or rales.  Chest:     Chest wall: No tenderness.  Abdominal:     General: Bowel sounds are normal.     Palpations: Abdomen is soft.     Tenderness: There is no abdominal tenderness.  Musculoskeletal: Normal range of motion.  Lymphadenopathy:     Cervical: No cervical adenopathy.  Skin:    General: Skin is warm and dry.     Capillary Refill: Capillary refill takes 2 to 3 seconds.     Comments: Skin of bilateral lower extremities is cool to touch.   Neurological:     Mental Status: She is alert and oriented to person, place, and time.     Cranial Nerves: No cranial nerve deficit.  Psychiatric:        Behavior: Behavior normal.        Thought Content: Thought content normal.        Judgment: Judgment normal.   Assessment/Plan: 1. Essential hypertension Stable. Continue bp medication as prescribed   2. Restless leg syndrome, controlled Improved. Continue requip as prescribed   3. Lumbosacral radiculopathy due to intervertebral disc disorder Stable. Continue etodolac as needed and as prescribed. Orthopedics as indicated  4. Generalized anxiety disorder May take alprazolam 0.5mg  daily if needed for acute anxiety. Refill provided today.  - ALPRAZolam (XANAX) 0.5 MG tablet; Take 1 tablet (0.5 mg total) by mouth daily as needed for anxiety. for anxiety  Dispense: 30 tablet; Refill: 3  General Counseling: Charmeka verbalizes understanding of the findings of todays visit and agrees with plan of treatment. I have discussed any further diagnostic evaluation that may be needed or ordered today. We also reviewed her medications today. she has been encouraged to call the office with any questions or concerns that should arise related to todays visit.   Hypertension Counseling:   The  following hypertensive lifestyle modification were recommended and discussed:  1. Limiting alcohol intake to less than 1 oz/day of ethanol:(24 oz of beer or 8 oz of wine or 2 oz of 100-proof whiskey). 2. Take baby ASA 81 mg daily. 3. Importance of regular aerobic exercise and losing weight. 4. Reduce dietary saturated fat and cholesterol intake for overall cardiovascular health. 5. Maintaining adequate dietary potassium, calcium, and magnesium intake. 6. Regular monitoring of the blood pressure. 7. Reduce sodium intake to less than 100 mmol/day (less than 2.3 gm of sodium or less than 6 gm of sodium choride)   This patient was seen by [place] with Dr Lavera Guise as a part of collaborative care agreement  Meds ordered this encounter  Medications  . ALPRAZolam (XANAX) 0.5 MG tablet    Sig: Take 1 tablet (0.5 mg total) by mouth daily as needed for anxiety. for anxiety    Dispense:  30 tablet    Refill:  3    Order Specific Question:   Supervising Provider    Answer:   Lavera Guise [[id number]]    Time spent: 34 Minutes      Dr Lavera Guise Internal medicine

## 2019-01-27 DIAGNOSIS — H26491 Other secondary cataract, right eye: Secondary | ICD-10-CM | POA: Diagnosis not present

## 2019-02-23 ENCOUNTER — Other Ambulatory Visit: Payer: Self-pay

## 2019-02-23 DIAGNOSIS — G2581 Restless legs syndrome: Secondary | ICD-10-CM

## 2019-02-23 MED ORDER — ROPINIROLE HCL 1 MG PO TABS: ORAL_TABLET | ORAL | 3 refills | Status: DC

## 2019-03-30 ENCOUNTER — Other Ambulatory Visit: Payer: Self-pay

## 2019-03-30 DIAGNOSIS — I10 Essential (primary) hypertension: Secondary | ICD-10-CM

## 2019-03-30 MED ORDER — BENAZEPRIL HCL 20 MG PO TABS: 20.0000 mg | ORAL_TABLET | Freq: Every day | ORAL | 0 refills | Status: DC

## 2019-04-22 DIAGNOSIS — M25531 Pain in right wrist: Secondary | ICD-10-CM | POA: Diagnosis not present

## 2019-04-22 DIAGNOSIS — M170 Bilateral primary osteoarthritis of knee: Secondary | ICD-10-CM | POA: Diagnosis not present

## 2019-04-29 DIAGNOSIS — I87393 Chronic venous hypertension (idiopathic) with other complications of bilateral lower extremity: Secondary | ICD-10-CM | POA: Diagnosis not present

## 2019-04-29 DIAGNOSIS — M79605 Pain in left leg: Secondary | ICD-10-CM | POA: Diagnosis not present

## 2019-04-29 DIAGNOSIS — M79604 Pain in right leg: Secondary | ICD-10-CM | POA: Diagnosis not present

## 2019-04-29 DIAGNOSIS — G2581 Restless legs syndrome: Secondary | ICD-10-CM | POA: Diagnosis not present

## 2019-05-04 IMAGING — US US RENAL
1 series · 14 of 25 positions shown · non-contrast
Comparison: None.

CLINICAL DATA: Recurrent urinary tract infection.

EXAM:
RENAL / URINARY TRACT ULTRASOUND COMPLETE

[Series 1: us renal · 14 of 27 slices shown]
[im 1/27]
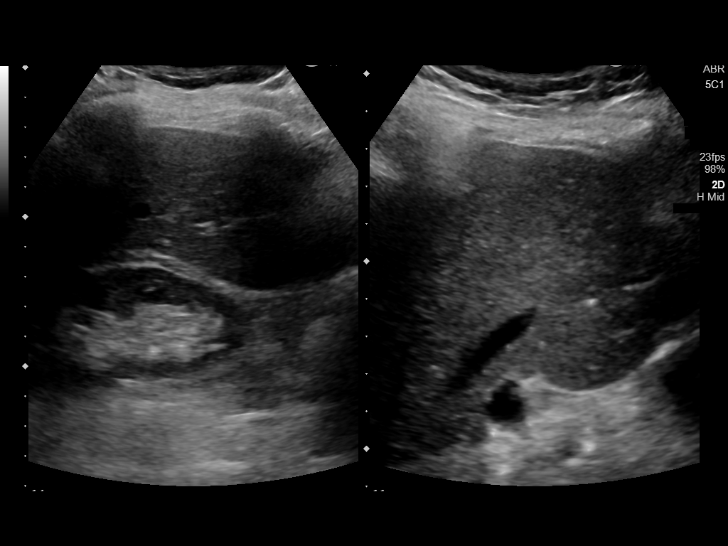
[im 3/27]
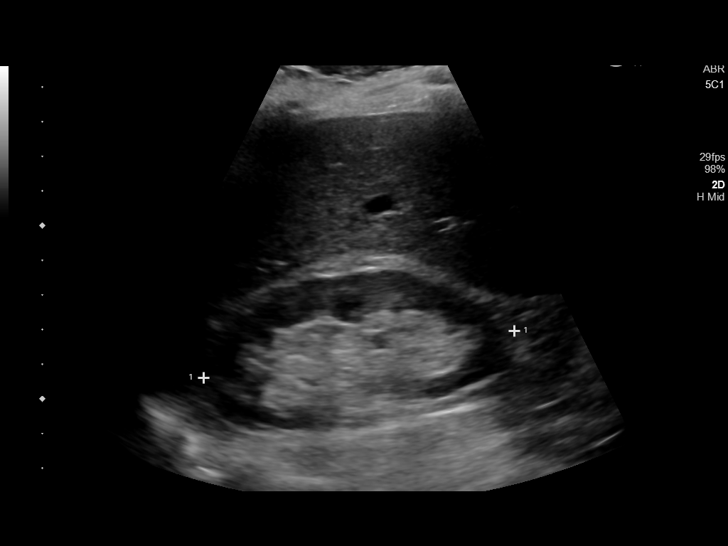
[im 5/27]
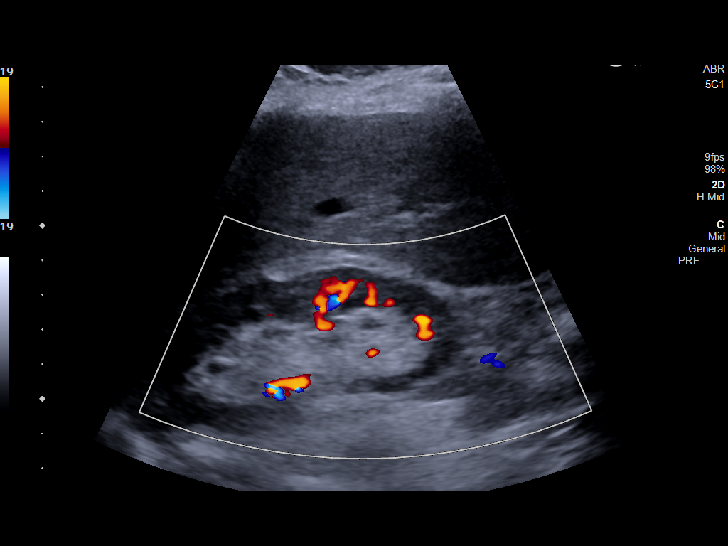
[im 7/27]
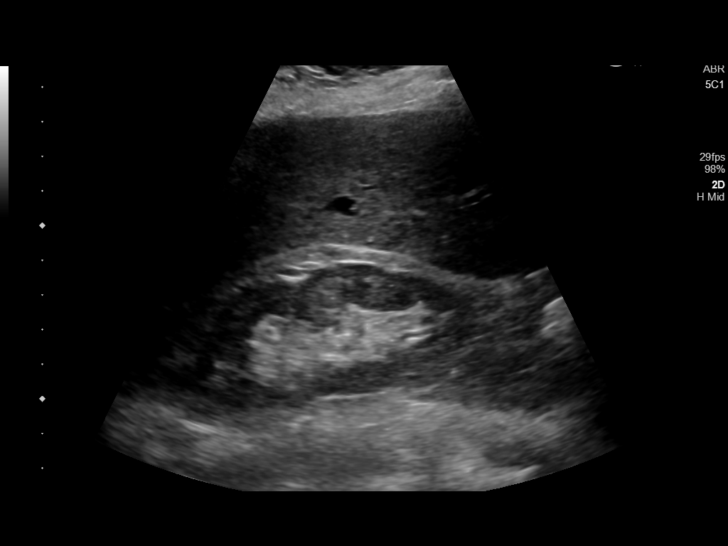
[im 9/27]
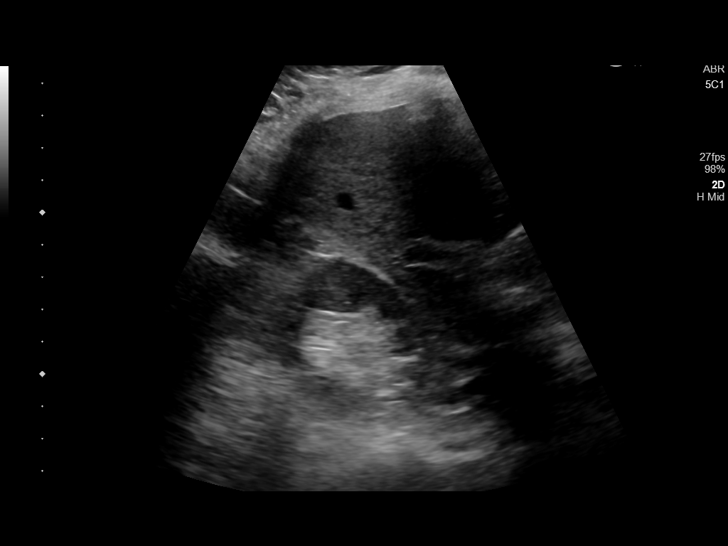
[im 10/27]
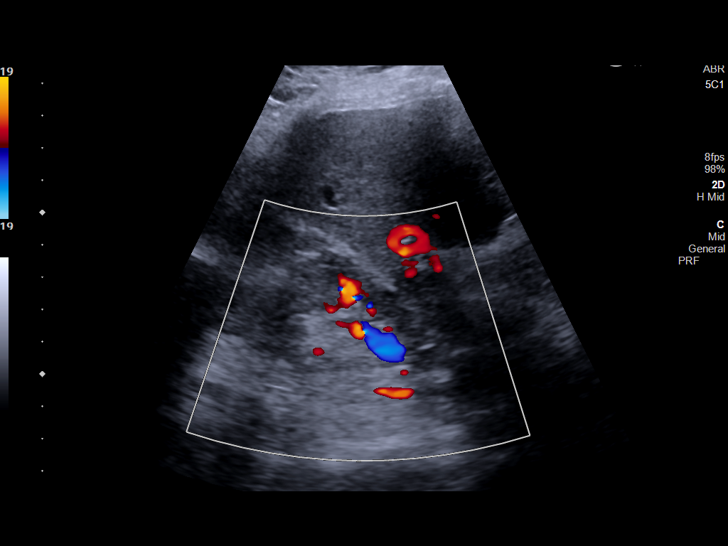
[im 12/27]
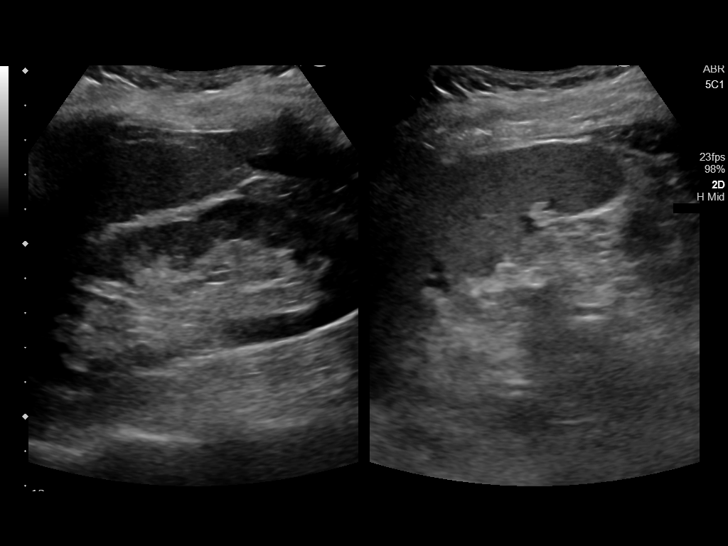
[im 15/27]
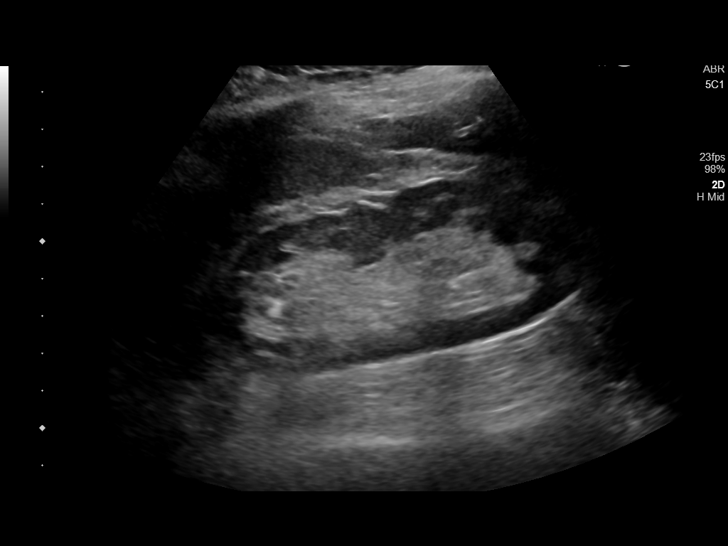
[im 17/27]
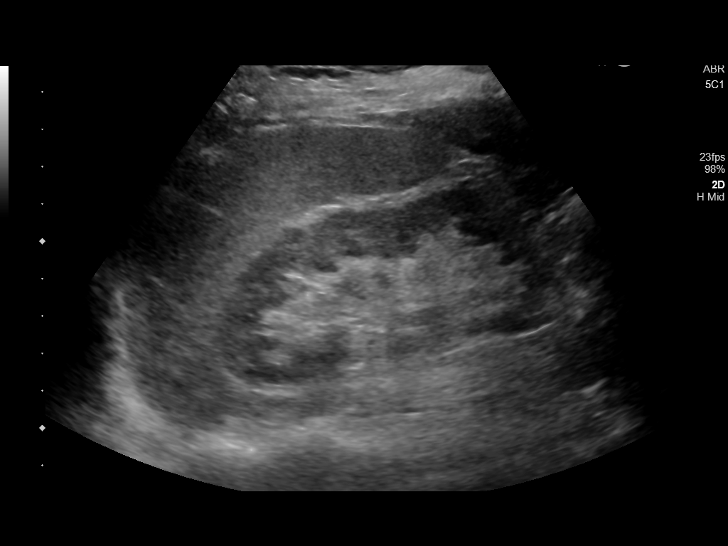
[im 18/27]
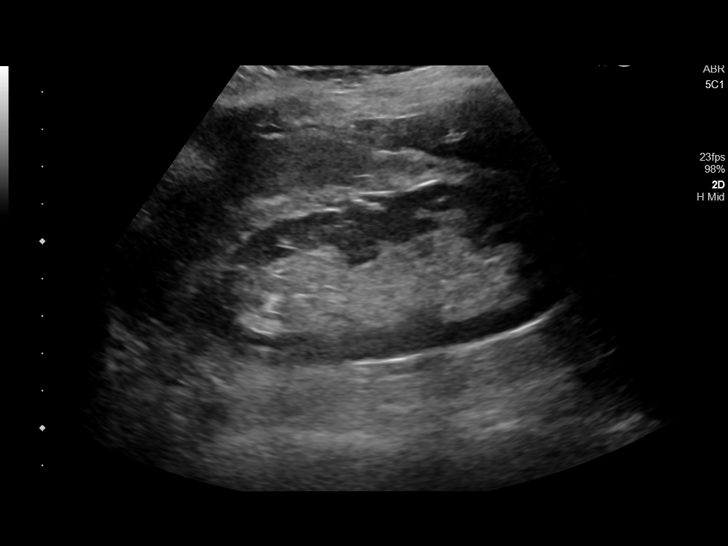
[im 20/27]
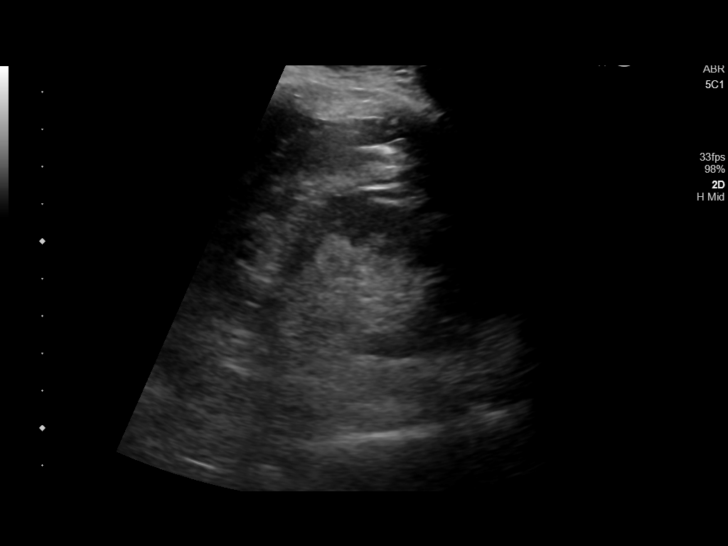
[im 22/27]
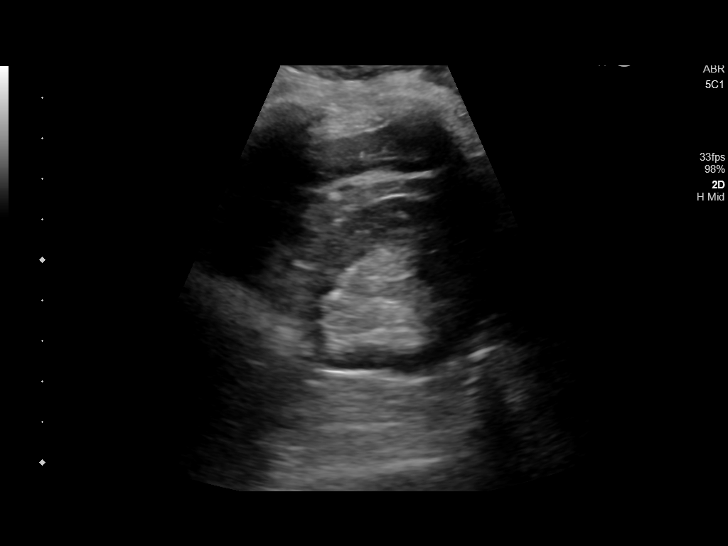
[im 24/27]
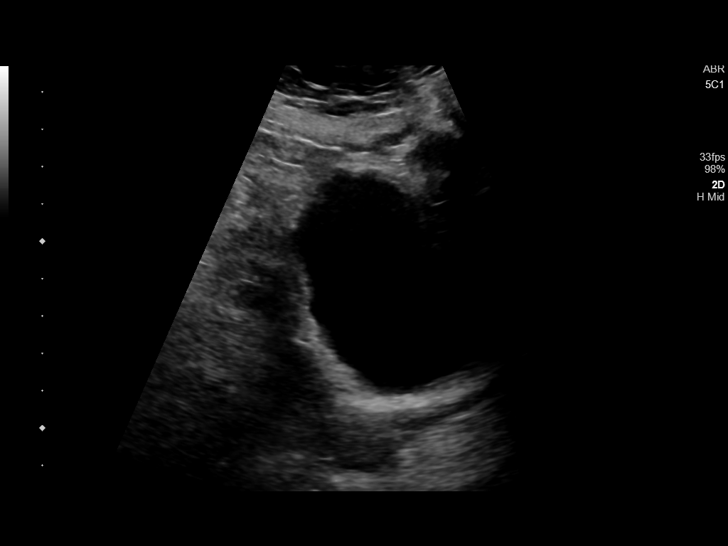
[im 27/27]
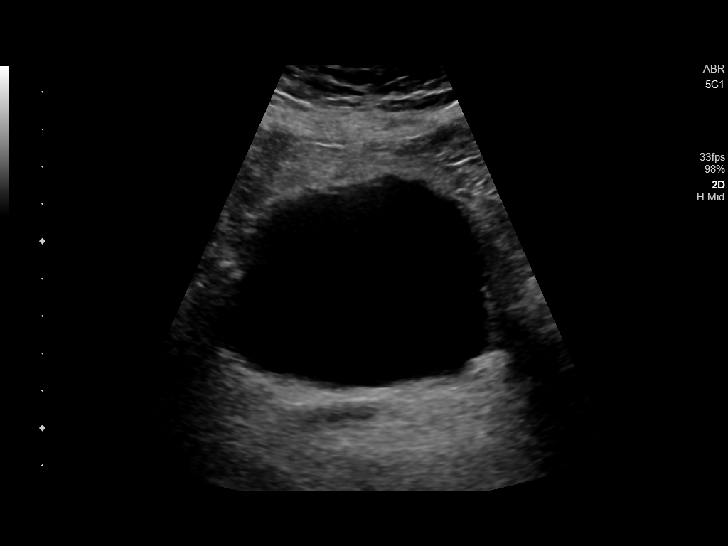

[14 of 25 positions shown; findings below may reference images not displayed]

FINDINGS: Right Kidney:

Length: 9.1 cm. Echogenicity within normal limits. No mass or
hydronephrosis visualized.

Left Kidney:

Length: 9.6 cm. Echogenicity within normal limits. No mass or
hydronephrosis visualized.

Bladder:

Appears normal for degree of bladder distention.
IMPRESSION: No renal abnormality is noted.

## 2019-05-06 ENCOUNTER — Ambulatory Visit (INDEPENDENT_AMBULATORY_CARE_PROVIDER_SITE_OTHER): Payer: PPO | Admitting: Nurse Practitioner

## 2019-05-06 ENCOUNTER — Other Ambulatory Visit: Payer: Self-pay

## 2019-05-06 ENCOUNTER — Encounter: Payer: Self-pay | Admitting: Nurse Practitioner

## 2019-05-06 VITALS — Ht 67.5 in | Wt 128.0 lb

## 2019-05-06 DIAGNOSIS — F411 Generalized anxiety disorder: Secondary | ICD-10-CM | POA: Diagnosis not present

## 2019-05-06 DIAGNOSIS — I10 Essential (primary) hypertension: Secondary | ICD-10-CM | POA: Diagnosis not present

## 2019-05-06 DIAGNOSIS — G2581 Restless legs syndrome: Secondary | ICD-10-CM | POA: Diagnosis not present

## 2019-05-06 MED ORDER — SPIRONOLACTONE 25 MG PO TABS: 25.0000 mg | ORAL_TABLET | Freq: Every day | ORAL | 3 refills | Status: DC

## 2019-05-06 MED ORDER — ALPRAZOLAM 0.5 MG PO TABS: 0.5000 mg | ORAL_TABLET | Freq: Every day | ORAL | 3 refills | Status: DC | PRN

## 2019-05-06 MED ORDER — ROPINIROLE HCL 1 MG PO TABS: ORAL_TABLET | ORAL | 3 refills | Status: DC

## 2019-05-06 NOTE — Progress Notes (Signed)
[facility] [place], [place] [address]  Internal MEDICINE  Telephone Visit  Patient Name: Sandra Davidson  [id number]  [id number]  Date of Service: 05/06/2019  I connected with the patient at 10:14am by telephone and verified the patients identity using two identifiers.   I discussed the limitations, risks, security and privacy concerns of performing an evaluation and management service by telephone and the availability of in person appointments. I also discussed with the patient that there may be a patient responsible charge related to the service.  The patient expressed understanding and agrees to proceed.    Chief Complaint  Patient presents with  . Telephone Screen    PHONE VISIT [phone]  . Telephone Assessment  . Medical Management of Chronic Issues    4 month routine follow up  . Hypertension  . Gastroesophageal Reflux    The patient has been contacted via telephone for follow up visit due to concerns for spread of novel coronavirus. The patient states that she needs to have medication refills today. She feels well. Does get a little lonesome since she has been self-quarantined due to coronavirus. She is staying indoors and is not interacting with other people too often. States that she feels good and has no concerns or complaints today.       Current Medication: Outpatient Encounter Medications as of 05/06/2019  Medication Sig  . ALPRAZolam (XANAX) 0.5 MG tablet Take 1 tablet (0.5 mg total) by mouth daily as needed for anxiety. for anxiety  . benazepril (LOTENSIN) 20 MG tablet Take 1 tablet (20 mg total) by mouth daily.  Marland Kitchen etodolac (LODINE) 400 MG tablet TAKE 1 TABLET BY MOUTH ONCE A DAY  . ibuprofen (ADVIL,MOTRIN) 600 MG tablet Take 1 tablet (600 mg total) by mouth every 8 (eight) hours as needed.  Marland Kitchen rOPINIRole (REQUIP) 1 MG tablet Take 1 to 2 tablets po QHS prn restless legs .  . spironolactone (ALDACTONE) 25 MG tablet Take 1 tablet (25 mg  total) by mouth daily.  . [DISCONTINUED] ALPRAZolam (XANAX) 0.5 MG tablet Take 1 tablet (0.5 mg total) by mouth daily as needed for anxiety. for anxiety  . [DISCONTINUED] rOPINIRole (REQUIP) 1 MG tablet Take 1 to 2 tablets po QHS prn restless legs .  . [DISCONTINUED] spironolactone (ALDACTONE) 25 MG tablet Take 25 mg by mouth daily.  . mirabegron ER (MYRBETRIQ) 50 MG TB24 tablet Take 1 tablet (50 mg total) by mouth daily. (Patient not taking: Reported on 01/04/2019)  . nitrofurantoin, macrocrystal-monohydrate, (MACROBID) 100 MG capsule Take 1 capsule (100 mg total) by mouth at bedtime. (Patient not taking: Reported on 05/06/2019)   No facility-administered encounter medications on file as of 05/06/2019.     Surgical History: Past Surgical History:  Procedure Laterality Date  . BACK SURGERY    . MOHS SURGERY    . NECK SURGERY    . TONSILLECTOMY      Medical History: Past Medical History:  Diagnosis Date  . Allergy   . Anxiety   . Arthritis   . Cancer ([place])    skin  . Depression   . GERD (gastroesophageal reflux disease)   . Hypertension   . Migraines     Family History: Family History  Problem Relation Age of Onset  . Congestive Heart Failure Mother   . COPD Mother   . Emphysema Mother   . Hypertension Mother   . Kidney disease Mother   . Cancer Father   . Kidney disease Father   .  Bladder Cancer Neg Hx     Social History   Socioeconomic History  . Marital status: Divorced    Spouse name: Not on file  . Number of children: Not on file  . Years of education: Not on file  . Highest education level: Not on file  Occupational History  . Not on file  Social Needs  . Financial resource strain: Not on file  . Food insecurity:    Worry: Not on file    Inability: Not on file  . Transportation needs:    Medical: Not on file    Non-medical: Not on file  Tobacco Use  . Smoking status: Former Research scientist (life sciences)  . Smokeless tobacco: Never Used  Substance and Sexual Activity  .  Alcohol use: No  . Drug use: No  . Sexual activity: Not Currently  Lifestyle  . Physical activity:    Days per week: Not on file    Minutes per session: Not on file  . Stress: Not on file  Relationships  . Social connections:    Talks on phone: Not on file    Gets together: Not on file    Attends religious service: Not on file    Active member of club or organization: Not on file    Attends meetings of clubs or organizations: Not on file    Relationship status: Not on file  . Intimate partner violence:    Fear of current or ex partner: Not on file    Emotionally abused: Not on file    Physically abused: Not on file    Forced sexual activity: Not on file  Other Topics Concern  . Not on file  Social History Narrative  . Not on file      Review of Systems  Constitutional: Negative for activity change, fatigue and unexpected weight change.  HENT: Negative for congestion, postnasal drip, rhinorrhea, sneezing and sore throat.   Respiratory: Negative for cough, chest tightness, shortness of breath and wheezing.   Cardiovascular: Negative for chest pain and palpitations.       Leg pain which is improving.  Gastrointestinal: Negative for abdominal pain, constipation, diarrhea, nausea and vomiting.  Endocrine: Negative for cold intolerance, heat intolerance, polydipsia and polyuria.  Musculoskeletal: Positive for arthralgias. Negative for back pain, joint swelling and neck pain.       Improved knee pain.  Skin: Negative for rash.  Allergic/Immunologic: Negative for environmental allergies.  Neurological: Positive for dizziness. Negative for tremors, numbness and headaches.  Hematological: Negative for adenopathy. Does not bruise/bleed easily.  Psychiatric/Behavioral: Negative for behavioral problems (Depression), sleep disturbance and suicidal ideas. The patient is nervous/anxious.    Today's Vitals   05/06/19 0952  Weight: 128 lb (58.1 kg)  Height: 5' 7.5" (1.715 m)   Body  mass index is 19.75 kg/m.  Observation/Objective:    The patient is alert and oriented. She is pleasant and answers all questions appropriately. Breathing is non-labored. She is in no acute distress at this time.    Assessment/Plan: 1. Essential hypertension Stable. Continue bp medication as prescribed  - spironolactone (ALDACTONE) 25 MG tablet; Take 1 tablet (25 mg total) by mouth daily.  Dispense: 90 tablet; Refill: 3  2. Generalized anxiety disorder May continue alprazolam 0.5mg  daily if needed for acute anxiety. New prescription sent to her pharmacy.  - ALPRAZolam (XANAX) 0.5 MG tablet; Take 1 tablet (0.5 mg total) by mouth daily as needed for anxiety. for anxiety  Dispense: 30 tablet; Refill: 3  3.  Restless leg syndrome, controlled Doing much better. Continue requip as prescribed. Refills provided today.  - rOPINIRole (REQUIP) 1 MG tablet; Take 1 to 2 tablets po QHS prn restless legs .  Dispense: 60 tablet; Refill: 3  General Counseling: Lundynn verbalizes understanding of the findings of today's phone visit and agrees with plan of treatment. I have discussed any further diagnostic evaluation that may be needed or ordered today. We also reviewed her medications today. she has been encouraged to call the office with any questions or concerns that should arise related to todays visit.  Hypertension Counseling:   The following hypertensive lifestyle modification were recommended and discussed:  1. Limiting alcohol intake to less than 1 oz/day of ethanol:(24 oz of beer or 8 oz of wine or 2 oz of 100-proof whiskey). 2. Take baby ASA 81 mg daily. 3. Importance of regular aerobic exercise and losing weight. 4. Reduce dietary saturated fat and cholesterol intake for overall cardiovascular health. 5. Maintaining adequate dietary potassium, calcium, and magnesium intake. 6. Regular monitoring of the blood pressure. 7. Reduce sodium intake to less than 100 mmol/day (less than 2.3 gm of sodium  or less than 6 gm of sodium choride)   This patient was seen by [place] with Dr Lavera Guise as a part of collaborative care agreement  Meds ordered this encounter  Medications  . ALPRAZolam (XANAX) 0.5 MG tablet    Sig: Take 1 tablet (0.5 mg total) by mouth daily as needed for anxiety. for anxiety    Dispense:  30 tablet    Refill:  3    Order Specific Question:   Supervising Provider    Answer:   Lavera Guise [[id number]]  . rOPINIRole (REQUIP) 1 MG tablet    Sig: Take 1 to 2 tablets po QHS prn restless legs .    Dispense:  60 tablet    Refill:  3    Please note increased dose    Order Specific Question:   Supervising Provider    Answer:   Lavera Guise [[id number]]  . spironolactone (ALDACTONE) 25 MG tablet    Sig: Take 1 tablet (25 mg total) by mouth daily.    Dispense:  90 tablet    Refill:  3    Order Specific Question:   Supervising Provider    Answer:   Lavera Guise [[id number]]    Time spent: 33 Minutes    Dr Lavera Guise Internal medicine

## 2019-05-07 DIAGNOSIS — L814 Other melanin hyperpigmentation: Secondary | ICD-10-CM | POA: Diagnosis not present

## 2019-05-07 DIAGNOSIS — D0439 Carcinoma in situ of skin of other parts of face: Secondary | ICD-10-CM | POA: Diagnosis not present

## 2019-05-07 DIAGNOSIS — D692 Other nonthrombocytopenic purpura: Secondary | ICD-10-CM | POA: Diagnosis not present

## 2019-05-07 DIAGNOSIS — D485 Neoplasm of uncertain behavior of skin: Secondary | ICD-10-CM | POA: Diagnosis not present

## 2019-05-07 DIAGNOSIS — C44319 Basal cell carcinoma of skin of other parts of face: Secondary | ICD-10-CM | POA: Diagnosis not present

## 2019-05-11 DIAGNOSIS — G5601 Carpal tunnel syndrome, right upper limb: Secondary | ICD-10-CM | POA: Diagnosis not present

## 2019-06-24 DIAGNOSIS — C44329 Squamous cell carcinoma of skin of other parts of face: Secondary | ICD-10-CM | POA: Diagnosis not present

## 2019-06-24 DIAGNOSIS — C44319 Basal cell carcinoma of skin of other parts of face: Secondary | ICD-10-CM | POA: Diagnosis not present

## 2019-06-28 ENCOUNTER — Encounter: Payer: Self-pay | Admitting: Nurse Practitioner

## 2019-06-28 ENCOUNTER — Ambulatory Visit (INDEPENDENT_AMBULATORY_CARE_PROVIDER_SITE_OTHER): Payer: PPO | Admitting: Nurse Practitioner

## 2019-06-28 ENCOUNTER — Other Ambulatory Visit: Payer: Self-pay

## 2019-06-28 VITALS — BP 105/48 | HR 58 | Resp 16 | Ht 67.0 in | Wt 121.8 lb

## 2019-06-28 DIAGNOSIS — F411 Generalized anxiety disorder: Secondary | ICD-10-CM | POA: Diagnosis not present

## 2019-06-28 DIAGNOSIS — R300 Dysuria: Secondary | ICD-10-CM

## 2019-06-28 DIAGNOSIS — I10 Essential (primary) hypertension: Secondary | ICD-10-CM

## 2019-06-28 DIAGNOSIS — Z0001 Encounter for general adult medical examination with abnormal findings: Secondary | ICD-10-CM

## 2019-06-28 DIAGNOSIS — G2581 Restless legs syndrome: Secondary | ICD-10-CM | POA: Diagnosis not present

## 2019-06-28 MED ORDER — BENAZEPRIL HCL 20 MG PO TABS: 20.0000 mg | ORAL_TABLET | Freq: Every day | ORAL | 0 refills | Status: DC

## 2019-06-28 NOTE — Progress Notes (Signed)
[facility] [place], [place] [address]  Internal MEDICINE  Office Visit Note  Patient Name: Sandra Davidson  [id number]  [id number]  Date of Service: 07/04/2019   Pt is here for routine health maintenance examination  Chief Complaint  Patient presents with  . Hypertension  . Anxiety  . Depression  . Leg Problem    leg and feet cramp  . Hand Problem    cramps     The patient is here for health maintenance exam. She reports some leg cramps and cramps in her hands. Will be so bad that hands "draw" up and she has to straighten them out herself. She does not take potassium or magnesium supplements. Blood pressure is slightly on low side today. She states that she feels well overall. Has had some intermittent lightheadedness. Blood pressure is generally slightly high.     Current Medication: Outpatient Encounter Medications as of 06/28/2019  Medication Sig  . ALPRAZolam (XANAX) 0.5 MG tablet Take 1 tablet (0.5 mg total) by mouth daily as needed for anxiety. for anxiety  . benazepril (LOTENSIN) 20 MG tablet Take 1 tablet (20 mg total) by mouth daily.  Marland Kitchen etodolac (LODINE) 400 MG tablet TAKE 1 TABLET BY MOUTH ONCE A DAY  . ibuprofen (ADVIL,MOTRIN) 600 MG tablet Take 1 tablet (600 mg total) by mouth every 8 (eight) hours as needed.  . nitrofurantoin, macrocrystal-monohydrate, (MACROBID) 100 MG capsule Take 1 capsule (100 mg total) by mouth at bedtime.  Marland Kitchen rOPINIRole (REQUIP) 1 MG tablet Take 1 to 2 tablets po QHS prn restless legs .  . spironolactone (ALDACTONE) 25 MG tablet Take 1 tablet (25 mg total) by mouth daily.  . [DISCONTINUED] benazepril (LOTENSIN) 20 MG tablet Take 1 tablet (20 mg total) by mouth daily.  . [DISCONTINUED] mirabegron ER (MYRBETRIQ) 50 MG TB24 tablet Take 1 tablet (50 mg total) by mouth daily. (Patient not taking: Reported on 06/28/2019)   No facility-administered encounter medications on file as of 06/28/2019.     Surgical  History: Past Surgical History:  Procedure Laterality Date  . BACK SURGERY    . MOHS SURGERY    . NECK SURGERY    . TONSILLECTOMY      Medical History: Past Medical History:  Diagnosis Date  . Allergy   . Anxiety   . Arthritis   . Cancer ([place])    skin  . Depression   . GERD (gastroesophageal reflux disease)   . Hypertension   . Migraines     Family History: Family History  Problem Relation Age of Onset  . Congestive Heart Failure Mother   . COPD Mother   . Emphysema Mother   . Hypertension Mother   . Kidney disease Mother   . Cancer Father   . Kidney disease Father   . Bladder Cancer Neg Hx       Review of Systems  Constitutional: Negative for activity change, fatigue and unexpected weight change.  HENT: Negative for congestion, postnasal drip, rhinorrhea, sneezing and sore throat.   Respiratory: Negative for cough, chest tightness, shortness of breath and wheezing.   Cardiovascular: Negative for chest pain and palpitations.       Leg pain which is improving.  Gastrointestinal: Negative for abdominal pain, constipation, diarrhea, nausea and vomiting.  Endocrine: Negative for cold intolerance, heat intolerance, polydipsia and polyuria.  Musculoskeletal: Positive for arthralgias. Negative for back pain, joint swelling and neck pain.       Improved knee pain.  Skin:  Negative for rash.  Allergic/Immunologic: Negative for environmental allergies.  Neurological: Positive for dizziness. Negative for tremors, numbness and headaches.  Hematological: Negative for adenopathy. Does not bruise/bleed easily.  Psychiatric/Behavioral: Negative for behavioral problems (Depression), sleep disturbance and suicidal ideas. The patient is nervous/anxious.      Today's Vitals   06/28/19 1505  BP: (!) 105/48  Pulse: (!) 58  Resp: 16  SpO2: 98%  Weight: 121 lb 12.8 oz (55.2 kg)  Height: 5\' 7"  (1.702 m)   Body mass index is 19.08 kg/m.  Physical Exam Vitals signs and  nursing note reviewed.  Constitutional:      General: She is not in acute distress.    Appearance: Normal appearance. She is well-developed. She is not diaphoretic.  HENT:     Head: Normocephalic and atraumatic.     Nose: Nose normal.     Mouth/Throat:     Pharynx: No oropharyngeal exudate.  Eyes:     Conjunctiva/sclera: Conjunctivae normal.     Pupils: Pupils are equal, round, and reactive to light.  Neck:     Musculoskeletal: Normal range of motion and neck supple.     Thyroid: No thyromegaly.     Vascular: No carotid bruit or JVD.     Trachea: No tracheal deviation.  Cardiovascular:     Rate and Rhythm: Normal rate and regular rhythm.     Pulses: Normal pulses.     Heart sounds: Normal heart sounds. No murmur. No friction rub. No gallop.      Comments: Bilateral leg pain, lower extremities feel cool to the touch. Pulses are palpable, bilaterally. She has good capillary refill. No edema is present in either foot or lower leg.  Pulmonary:     Effort: Pulmonary effort is normal. No respiratory distress.     Breath sounds: Normal breath sounds. No wheezing or rales.  Chest:     Chest wall: No tenderness.  Abdominal:     General: Bowel sounds are normal.     Palpations: Abdomen is soft.     Tenderness: There is no abdominal tenderness.  Musculoskeletal: Normal range of motion.  Lymphadenopathy:     Cervical: No cervical adenopathy.  Skin:    General: Skin is warm and dry.     Capillary Refill: Capillary refill takes 2 to 3 seconds.     Comments: Skin of bilateral lower extremities is cool to touch.   Neurological:     General: No focal deficit present.     Mental Status: She is alert and oriented to person, place, and time.     Cranial Nerves: No cranial nerve deficit.  Psychiatric:        Behavior: Behavior normal.        Thought Content: Thought content normal.        Judgment: Judgment normal.    Depression screen [facility] 2/9 06/28/2019 05/06/2019 01/04/2019 10/01/2018  09/03/2018  Decreased Interest 0 0 1 0 0  Down, Depressed, Hopeless 0 0 0 0 0  PHQ - 2 Score 0 0 1 0 0    Functional Status Survey: Is the patient deaf or have difficulty hearing?: No Does the patient have difficulty seeing, even when wearing glasses/contacts?: No Does the patient have difficulty concentrating, remembering, or making decisions?: No Does the patient have difficulty walking or climbing stairs?: Yes Does the patient have difficulty dressing or bathing?: No Does the patient have difficulty doing errands alone such as visiting a doctor's office or shopping?: No  MMSE -  Mini Mental State Exam 06/28/2019 06/23/2018  Orientation to time 5 5  Orientation to Place 5 5  Registration 3 3  Attention/ Calculation 5 5  Recall 3 3  Language- name 2 objects 2 2  Language- repeat 1 1  Language- follow 3 step command 3 3  Language- read & follow direction 1 1  Write a sentence 1 1  Copy design 1 1  Total score 30 30    Fall Risk  06/28/2019 05/06/2019 01/04/2019 10/01/2018 09/03/2018  Falls in the past year? 0 0 0 No No  Number falls in past yr: - - 0 - -  Injury with Fall? - - 0 - -      LABS: Recent Results (from the past 2160 hour(s))  Urinalysis, Routine w reflex microscopic     Status: Abnormal   Collection Time: 06/28/19 12:00 AM  Result Value Ref Range   Specific Gravity, UA 1.022 1.005 - 1.030   pH, UA 5.0 5.0 - 7.5   Color, UA Yellow Yellow   Appearance Ur Clear Clear   Leukocytes,UA 1+ (A) Negative   Protein,UA Negative Negative/Trace   Glucose, UA Negative Negative   Ketones, UA Negative Negative   RBC, UA Negative Negative   Bilirubin, UA Negative Negative   Urobilinogen, Ur 0.2 0.2 - 1.0 mg/dL   Nitrite, UA Negative Negative   Microscopic Examination See below:     Comment: Microscopic was indicated and was performed.  Microscopic Examination     Status: Abnormal   Collection Time: 06/28/19 12:00 AM   URINE  Result Value Ref Range   WBC, UA 11-30 (A) 0  - 5 /hpf   RBC 0-2 0 - 2 /hpf   Epithelial Cells (non renal) 0-10 0 - 10 /hpf   Casts Present (A) None seen /lpf   Cast Type Hyaline casts N/A   Mucus, UA Present Not Estab.   Bacteria, UA None seen None seen/Few   Assessment/Plan: 1. Encounter for general adult medical examination with abnormal findings Annual health maintenance exam today  2. Essential hypertension Stable. Continue bp medication as prescribed  - benazepril (LOTENSIN) 20 MG tablet; Take 1 tablet (20 mg total) by mouth daily.  Dispense: 90 tablet; Refill: 0  3. Restless leg syndrome, controlled Improved on requip. Continue as prescribed   4. Generalized anxiety disorder May take alprazolam 0.5mg  daily as needed for acute anxiety.   5. Dysuria - Urinalysis, Routine w reflex microscopic  General Counseling: Miu verbalizes understanding of the findings of todays visit and agrees with plan of treatment. I have discussed any further diagnostic evaluation that may be needed or ordered today. We also reviewed her medications today. she has been encouraged to call the office with any questions or concerns that should arise related to todays visit.    Counseling:  Hypertension Counseling:   The following hypertensive lifestyle modification were recommended and discussed:  1. Limiting alcohol intake to less than 1 oz/day of ethanol:(24 oz of beer or 8 oz of wine or 2 oz of 100-proof whiskey). 2. Take baby ASA 81 mg daily. 3. Importance of regular aerobic exercise and losing weight. 4. Reduce dietary saturated fat and cholesterol intake for overall cardiovascular health. 5. Maintaining adequate dietary potassium, calcium, and magnesium intake. 6. Regular monitoring of the blood pressure. 7. Reduce sodium intake to less than 100 mmol/day (less than 2.3 gm of sodium or less than 6 gm of sodium choride)   This patient was seen by Leretha Pol  FNP Collaboration with Dr Lavera Guise as a part of collaborative care  agreement  Orders Placed This Encounter  Procedures  . Microscopic Examination  . Urinalysis, Routine w reflex microscopic    Meds ordered this encounter  Medications  . benazepril (LOTENSIN) 20 MG tablet    Sig: Take 1 tablet (20 mg total) by mouth daily.    Dispense:  90 tablet    Refill:  0    Patient instructed to take 1/2 benazepril daily. Continue spironolactone as prescribed    Order Specific Question:   Supervising Provider    Answer:   Lavera Guise [[id number]]    Time spent: [place], MD  Internal Medicine

## 2019-06-29 LAB — MICROSCOPIC EXAMINATION: Bacteria, UA: NONE SEEN

## 2019-06-29 LAB — URINALYSIS, ROUTINE W REFLEX MICROSCOPIC
Bilirubin, UA: NEGATIVE
Glucose, UA: NEGATIVE
Ketones, UA: NEGATIVE
Nitrite, UA: NEGATIVE
Protein,UA: NEGATIVE
RBC, UA: NEGATIVE
Specific Gravity, UA: 1.022 (ref 1.005–1.030)
Urobilinogen, Ur: 0.2 mg/dL (ref 0.2–1.0)
pH, UA: 5 (ref 5.0–7.5)

## 2019-07-08 ENCOUNTER — Other Ambulatory Visit: Payer: Self-pay

## 2019-07-08 DIAGNOSIS — I10 Essential (primary) hypertension: Secondary | ICD-10-CM

## 2019-07-08 MED ORDER — BENAZEPRIL HCL 20 MG PO TABS: 20.0000 mg | ORAL_TABLET | Freq: Every day | ORAL | 0 refills | Status: DC

## 2019-07-23 ENCOUNTER — Other Ambulatory Visit: Payer: Self-pay | Admitting: Nurse Practitioner

## 2019-07-23 ENCOUNTER — Telehealth: Payer: Self-pay

## 2019-07-23 DIAGNOSIS — L0390 Cellulitis, unspecified: Secondary | ICD-10-CM

## 2019-07-23 MED ORDER — CEPHALEXIN 500 MG PO CAPS: 500.0000 mg | ORAL_CAPSULE | Freq: Three times a day (TID) | ORAL | 0 refills | Status: DC

## 2019-07-23 NOTE — Telephone Encounter (Signed)
Called twice and lmom.

## 2019-07-23 NOTE — Progress Notes (Signed)
Infected abriasion. Sent in keflex 500mg  TID for 7 days. Advise patient of relationship between penicillin and cephalosporin. Will have her monitor closely for side effects.

## 2019-07-23 NOTE — Telephone Encounter (Signed)
Antibiotics are a godd idea here. Please let her know I sent in prescription for keflex 500mg  three times daily for 7 days. Please let her know that keflex is a cousin to penicillins. Rarely, people who are allergic to penicilli can have reaction to keflex.  Advise her to monitor closely for negative symptoms. This is generally a very good medicine to use for skin infections like what she has. Thanks.

## 2019-07-27 DIAGNOSIS — S80811A Abrasion, right lower leg, initial encounter: Secondary | ICD-10-CM | POA: Diagnosis not present

## 2019-07-27 DIAGNOSIS — L089 Local infection of the skin and subcutaneous tissue, unspecified: Secondary | ICD-10-CM | POA: Diagnosis not present

## 2019-08-02 ENCOUNTER — Ambulatory Visit (INDEPENDENT_AMBULATORY_CARE_PROVIDER_SITE_OTHER): Payer: PPO | Admitting: Nurse Practitioner

## 2019-08-02 ENCOUNTER — Encounter: Payer: Self-pay | Admitting: Nurse Practitioner

## 2019-08-02 ENCOUNTER — Other Ambulatory Visit: Payer: Self-pay

## 2019-08-02 VITALS — BP 135/45 | HR 59 | Temp 96.9°F | Resp 16 | Ht 67.0 in | Wt 121.9 lb

## 2019-08-02 DIAGNOSIS — L03115 Cellulitis of right lower limb: Secondary | ICD-10-CM | POA: Diagnosis not present

## 2019-08-02 MED ORDER — CIPROFLOXACIN HCL 500 MG PO TABS: 500.0000 mg | ORAL_TABLET | Freq: Two times a day (BID) | ORAL | 0 refills | Status: DC

## 2019-08-02 NOTE — Progress Notes (Signed)
[facility] [place], [place] [address]  Internal MEDICINE  Office Visit Note  Patient Name: Sandra Davidson  [id number]  [id number]  Date of Service: 08/02/2019  Chief Complaint  Patient presents with  . Leg Problem    scratch on leg is not healing, painful sometimes,red and irritated went to urgent care last wednesday and was given doxycycline and advised if it was still giving her an issue to see pcp.  . Medication Management    advised to stop taking keflex at urgent care after 5 days and now taking doxycycline     The patient is here for sick visit. She hit her lower right leg on the door, peeling the skin off of the leg. This happened three week ago this past Friday. She started on keflex 07/23/2019. Was taking this three times daily. When redness seemed to get worse, she went to Urgent care this past Wednesday. They stopped Keflex and started doxycycline. They also gave her prescription antibacterial ointment. She is cleaning the area three times daily with antibacterial soap and warm water, applying the ointment and clean, dry bandage. She does not feel that wound is healing as well as it should. She states that it hurts sometimes, like sharp pain running through it. Will last for a few minutes then ease off. She states that it is tender at all times. She states that when she changes the dressing, it drains some yellow fluid.   Pt is here for a sick visit.     Current Medication:  Outpatient Encounter Medications as of 08/02/2019  Medication Sig  . ALPRAZolam (XANAX) 0.5 MG tablet Take 1 tablet (0.5 mg total) by mouth daily as needed for anxiety. for anxiety  . benazepril (LOTENSIN) 20 MG tablet Take 1 tablet (20 mg total) by mouth daily.  Marland Kitchen etodolac (LODINE) 400 MG tablet TAKE 1 TABLET BY MOUTH ONCE A DAY  . ibuprofen (ADVIL,MOTRIN) 600 MG tablet Take 1 tablet (600 mg total) by mouth every 8 (eight) hours as needed.  . nitrofurantoin,  macrocrystal-monohydrate, (MACROBID) 100 MG capsule Take 1 capsule (100 mg total) by mouth at bedtime.  Marland Kitchen rOPINIRole (REQUIP) 1 MG tablet Take 1 to 2 tablets po QHS prn restless legs .  . spironolactone (ALDACTONE) 25 MG tablet Take 1 tablet (25 mg total) by mouth daily.  . ciprofloxacin (CIPRO) 500 MG tablet Take 1 tablet (500 mg total) by mouth 2 (two) times daily.  . [DISCONTINUED] cephALEXin (KEFLEX) 500 MG capsule Take 1 capsule (500 mg total) by mouth 3 (three) times daily. (Patient not taking: Reported on 08/02/2019)   No facility-administered encounter medications on file as of 08/02/2019.       Medical History: Past Medical History:  Diagnosis Date  . Allergy   . Anxiety   . Arthritis   . Cancer ([place])    skin  . Depression   . GERD (gastroesophageal reflux disease)   . Hypertension   . Migraines     Today's Vitals   08/02/19 1010  BP: (!) 135/45  Pulse: (!) 59  Resp: 16  Temp: (!) 96.9 F (36.1 C)  SpO2: 98%  Weight: 121 lb 14.4 oz (55.3 kg)  Height: 5\' 7"  (1.702 m)   Body mass index is 19.09 kg/m.  Review of Systems  Constitutional: Negative for activity change, fatigue and unexpected weight change.  HENT: Negative for congestion, postnasal drip, rhinorrhea, sneezing and sore throat.   Respiratory: Negative for cough, chest tightness, shortness  of breath and wheezing.   Cardiovascular: Negative for chest pain and palpitations.  Gastrointestinal: Negative for abdominal pain, constipation, diarrhea, nausea and vomiting.  Endocrine: Negative for cold intolerance, heat intolerance, polydipsia and polyuria.  Musculoskeletal: Positive for arthralgias. Negative for back pain, joint swelling and neck pain.       Improved knee pain.  Skin: Negative for rash.       Laceration to right lower extremity present. Not iproving despite antibiotic treatment.   Allergic/Immunologic: Negative for environmental allergies.  Neurological: Negative for dizziness, tremors, numbness  and headaches.  Hematological: Negative for adenopathy. Does not bruise/bleed easily.  Psychiatric/Behavioral: Negative for behavioral problems (Depression), sleep disturbance and suicidal ideas. The patient is nervous/anxious.     Physical Exam Vitals signs and nursing note reviewed.  Constitutional:      General: She is not in acute distress.    Appearance: Normal appearance. She is well-developed. She is not diaphoretic.  HENT:     Head: Normocephalic and atraumatic.     Mouth/Throat:     Pharynx: No oropharyngeal exudate.  Eyes:     Pupils: Pupils are equal, round, and reactive to light.  Neck:     Musculoskeletal: Normal range of motion and neck supple.     Thyroid: No thyromegaly.     Vascular: No JVD.     Trachea: No tracheal deviation.  Cardiovascular:     Rate and Rhythm: Normal rate and regular rhythm.     Heart sounds: Normal heart sounds. No murmur. No friction rub. No gallop.   Pulmonary:     Effort: Pulmonary effort is normal. No respiratory distress.     Breath sounds: Normal breath sounds. No wheezing or rales.  Chest:     Chest wall: No tenderness.  Abdominal:     Palpations: Abdomen is soft.  Musculoskeletal: Normal range of motion.  Lymphadenopathy:     Cervical: No cervical adenopathy.  Skin:    General: Skin is warm and dry.       Neurological:     Mental Status: She is alert and oriented to person, place, and time.     Cranial Nerves: No cranial nerve deficit.  Psychiatric:        Behavior: Behavior normal.        Thought Content: Thought content normal.        Judgment: Judgment normal.    Assessment/Plan: 1. Cellulitis of right lower extremity without foot Wound redressed with vaseline guaze and dry guaze and secured with paper tape. Extra supplies given to patient. Stope doxycycline. Start cipro 500mg  bid for 10 days. Continue to keep wound clean with warm water and antibacterial soap twice daily. Apply antibiotic ointment followed with the  vaseline guaze then dry guaze. She should contact the office if symptoms get worse or do not get better over the next week.  - ciprofloxacin (CIPRO) 500 MG tablet; Take 1 tablet (500 mg total) by mouth 2 (two) times daily.  Dispense: 20 tablet; Refill: 0  General Counseling: Steph verbalizes understanding of the findings of todays visit and agrees with plan of treatment. I have discussed any further diagnostic evaluation that may be needed or ordered today. We also reviewed her medications today. she has been encouraged to call the office with any questions or concerns that should arise related to todays visit.    Counseling:   This patient was seen by Leretha Pol FNP Collaboration with Dr Lavera Guise as a part of collaborative care agreement  Meds ordered this encounter  Medications  . ciprofloxacin (CIPRO) 500 MG tablet    Sig: Take 1 tablet (500 mg total) by mouth 2 (two) times daily.    Dispense:  20 tablet    Refill:  0    Order Specific Question:   Supervising Provider    Answer:   Lavera Guise [id number]    Time spent: 25 Minutes

## 2019-08-13 ENCOUNTER — Encounter: Payer: Self-pay | Admitting: Nurse Practitioner

## 2019-08-13 ENCOUNTER — Ambulatory Visit (INDEPENDENT_AMBULATORY_CARE_PROVIDER_SITE_OTHER): Payer: PPO | Admitting: Nurse Practitioner

## 2019-08-13 ENCOUNTER — Other Ambulatory Visit: Payer: Self-pay

## 2019-08-13 VITALS — BP 122/49 | HR 57 | Temp 96.8°F | Resp 16 | Ht 66.5 in | Wt 120.0 lb

## 2019-08-13 DIAGNOSIS — H6690 Otitis media, unspecified, unspecified ear: Secondary | ICD-10-CM | POA: Diagnosis not present

## 2019-08-13 DIAGNOSIS — I10 Essential (primary) hypertension: Secondary | ICD-10-CM

## 2019-08-13 DIAGNOSIS — R42 Dizziness and giddiness: Secondary | ICD-10-CM | POA: Diagnosis not present

## 2019-08-13 MED ORDER — MECLIZINE HCL 25 MG PO TABS: 25.0000 mg | ORAL_TABLET | Freq: Three times a day (TID) | ORAL | 0 refills | Status: DC | PRN

## 2019-08-13 MED ORDER — CIPROFLOXACIN-DEXAMETHASONE 0.3-0.1 % OT SUSP: 4.0000 [drp] | Freq: Two times a day (BID) | OTIC | 0 refills | Status: DC

## 2019-08-13 NOTE — Progress Notes (Signed)
[facility] [place], [place] [address]  Internal MEDICINE  Office Visit Note  Patient Name: Sandra Davidson  [id number]  [id number]  Date of Service: 08/25/2019   Pt is here for a sick visit.   Chief Complaint  Patient presents with  . Dizziness    couple of days now   . Hypertension    bp is low , pulse low      The patient is here for sick visit. She started having dizziness, the day before yesterday. She states that it has been getting worse over this time. Today, it has become so bad that she is having to hold onto furniture or the walls to help with her balance and keep her from falling. She states that she does have some intermittent right ear pain, which she describes the pain as piercing. She does not have this in the left ear. She denies nasal congestion or headache. She denies fever. She denies nausea and vomiting. Diastolic blood pressure is a bit low. Heart rate is a bit low. She has intermittent, "tickling" type cough. Happens about twice daily, but resolves on it's own.       Current Medication:  Outpatient Encounter Medications as of 08/13/2019  Medication Sig  . ALPRAZolam (XANAX) 0.5 MG tablet Take 1 tablet (0.5 mg total) by mouth daily as needed for anxiety. for anxiety  . benazepril (LOTENSIN) 20 MG tablet Take 1 tablet (20 mg total) by mouth daily.  Marland Kitchen etodolac (LODINE) 400 MG tablet TAKE 1 TABLET BY MOUTH ONCE A DAY  . ibuprofen (ADVIL,MOTRIN) 600 MG tablet Take 1 tablet (600 mg total) by mouth every 8 (eight) hours as needed.  Marland Kitchen rOPINIRole (REQUIP) 1 MG tablet Take 1 to 2 tablets po QHS prn restless legs .  . spironolactone (ALDACTONE) 25 MG tablet Take 1 tablet (25 mg total) by mouth daily.  . ciprofloxacin-dexamethasone (CIPRODEX) OTIC suspension Place 4 drops into the right ear 2 (two) times daily.  . meclizine (ANTIVERT) 25 MG tablet Take 1 tablet (25 mg total) by mouth 3 (three) times daily as needed for dizziness.  .  [DISCONTINUED] ciprofloxacin (CIPRO) 500 MG tablet Take 1 tablet (500 mg total) by mouth 2 (two) times daily. (Patient not taking: Reported on 08/13/2019)  . [DISCONTINUED] nitrofurantoin, macrocrystal-monohydrate, (MACROBID) 100 MG capsule Take 1 capsule (100 mg total) by mouth at bedtime. (Patient not taking: Reported on 08/13/2019)   No facility-administered encounter medications on file as of 08/13/2019.       Medical History: Past Medical History:  Diagnosis Date  . Allergy   . Anxiety   . Arthritis   . Cancer ([place])    skin  . Depression   . GERD (gastroesophageal reflux disease)   . Hypertension   . Migraines      Today's Vitals   08/13/19 1332  BP: (!) 122/49  Pulse: (!) 57  Resp: 16  Temp: (!) 96.8 F (36 C)  SpO2: 98%  Weight: 120 lb (54.4 kg)  Height: 5' 6.5" (1.689 m)   Body mass index is 19.08 kg/m.  Review of Systems  Constitutional: Positive for fatigue. Negative for chills, fever and unexpected weight change.  HENT: Positive for ear pain and postnasal drip. Negative for congestion, rhinorrhea, sneezing and sore throat.        Intermittent, piercing ear pain in left ear. Pain is deep inside the ear canal.   Respiratory: Positive for cough. Negative for chest tightness, shortness of breath  and wheezing.   Cardiovascular: Negative for chest pain and palpitations.       Blood pressure and heart rate are both a little low.  Gastrointestinal: Negative for abdominal pain, constipation, diarrhea, nausea and vomiting.  Musculoskeletal: Negative for arthralgias, back pain, joint swelling and neck pain.  Skin: Negative for rash.  Neurological: Positive for dizziness and light-headedness. Negative for tremors and numbness.  Hematological: Negative for adenopathy. Does not bruise/bleed easily.  Psychiatric/Behavioral: Negative for behavioral problems (Depression), sleep disturbance and suicidal ideas. The patient is not nervous/anxious.     Physical Exam Vitals signs  and nursing note reviewed.  Constitutional:      General: She is not in acute distress.    Appearance: Normal appearance. She is well-developed. She is not diaphoretic.  HENT:     Head: Normocephalic and atraumatic.     Right Ear: Swelling and tenderness present. Tympanic membrane is erythematous and bulging.     Left Ear: Tympanic membrane is bulging.     Nose: Nose normal.     Mouth/Throat:     Pharynx: No oropharyngeal exudate.  Eyes:     Pupils: Pupils are equal, round, and reactive to light.  Neck:     Musculoskeletal: Normal range of motion and neck supple.     Thyroid: No thyromegaly.     Vascular: No carotid bruit or JVD.     Trachea: No tracheal deviation.  Cardiovascular:     Rate and Rhythm: Normal rate and regular rhythm.     Heart sounds: Normal heart sounds. No murmur. No friction rub. No gallop.   Pulmonary:     Effort: Pulmonary effort is normal. No respiratory distress.     Breath sounds: Normal breath sounds. No wheezing or rales.  Chest:     Chest wall: No tenderness.  Abdominal:     Palpations: Abdomen is soft.  Musculoskeletal: Normal range of motion.  Lymphadenopathy:     Cervical: No cervical adenopathy.  Skin:    General: Skin is warm and dry.  Neurological:     General: No focal deficit present.     Mental Status: She is alert and oriented to person, place, and time.     Cranial Nerves: No cranial nerve deficit.  Psychiatric:        Behavior: Behavior normal.        Thought Content: Thought content normal.        Judgment: Judgment normal.    Assessment/Plan: 1. Acute otitis media, unspecified otitis media type Insert four drops in right ear twice daily for next 7 to 10 days.  - ciprofloxacin-dexamethasone (CIPRODEX) OTIC suspension; Place 4 drops into the right ear 2 (two) times daily.  Dispense: 7.5 mL; Refill: 0  2. Vertigo Add meclizine 25mg  up to three times daily if needed for vertigo. Encouraged her to increase fluid intake.  -  meclizine (ANTIVERT) 25 MG tablet; Take 1 tablet (25 mg total) by mouth 3 (three) times daily as needed for dizziness.  Dispense: 30 tablet; Refill: 0  3. Essential hypertension Stable. Continue bp medication as prescribed .  General Counseling: [name] verbalizes understanding of the findings of todays visit and agrees with plan of treatment. I have discussed any further diagnostic evaluation that may be needed or ordered today. We also reviewed her medications today. she has been encouraged to call the office with any questions or concerns that should arise related to todays visit.    Counseling:   This patient was seen by Nira Conn  [place] with Dr Lavera Guise as a part of collaborative care agreement  Meds ordered this encounter  Medications  . ciprofloxacin-dexamethasone (CIPRODEX) OTIC suspension    Sig: Place 4 drops into the right ear 2 (two) times daily.    Dispense:  7.5 mL    Refill:  0    Order Specific Question:   Supervising Provider    Answer:   Lavera Guise [id number]  . meclizine (ANTIVERT) 25 MG tablet    Sig: Take 1 tablet (25 mg total) by mouth 3 (three) times daily as needed for dizziness.    Dispense:  30 tablet    Refill:  0    Order Specific Question:   Supervising Provider    Answer:   Lavera Guise [id number]    Time spent: 15 Minutes

## 2019-08-14 ENCOUNTER — Emergency Department
Admission: EM | Admit: 2019-08-14 | Discharge: 2019-08-14 | Disposition: A | Discharge status: Home / Self Care | Payer: PPO | Attending: Emergency Medicine | Admitting: Emergency Medicine

## 2019-08-14 ENCOUNTER — Encounter: Payer: Self-pay | Admitting: Emergency Medicine

## 2019-08-14 ENCOUNTER — Emergency Department: Payer: PPO

## 2019-08-14 DIAGNOSIS — R42 Dizziness and giddiness: Secondary | ICD-10-CM | POA: Diagnosis not present

## 2019-08-14 DIAGNOSIS — W06XXXA Fall from bed, initial encounter: Secondary | ICD-10-CM | POA: Insufficient documentation

## 2019-08-14 DIAGNOSIS — Y92013 Bedroom of single-family (private) house as the place of occurrence of the external cause: Secondary | ICD-10-CM | POA: Diagnosis not present

## 2019-08-14 DIAGNOSIS — S0083XA Contusion of other part of head, initial encounter: Secondary | ICD-10-CM | POA: Diagnosis not present

## 2019-08-14 DIAGNOSIS — Z87891 Personal history of nicotine dependence: Secondary | ICD-10-CM | POA: Insufficient documentation

## 2019-08-14 DIAGNOSIS — Z79899 Other long term (current) drug therapy: Secondary | ICD-10-CM | POA: Diagnosis not present

## 2019-08-14 DIAGNOSIS — I10 Essential (primary) hypertension: Secondary | ICD-10-CM | POA: Diagnosis not present

## 2019-08-14 DIAGNOSIS — S51011A Laceration without foreign body of right elbow, initial encounter: Secondary | ICD-10-CM | POA: Insufficient documentation

## 2019-08-14 DIAGNOSIS — Z23 Encounter for immunization: Secondary | ICD-10-CM | POA: Diagnosis not present

## 2019-08-14 DIAGNOSIS — S0990XA Unspecified injury of head, initial encounter: Secondary | ICD-10-CM | POA: Diagnosis not present

## 2019-08-14 DIAGNOSIS — Y999 Unspecified external cause status: Secondary | ICD-10-CM | POA: Insufficient documentation

## 2019-08-14 DIAGNOSIS — Y9389 Activity, other specified: Secondary | ICD-10-CM | POA: Diagnosis not present

## 2019-08-14 DIAGNOSIS — Z85828 Personal history of other malignant neoplasm of skin: Secondary | ICD-10-CM | POA: Diagnosis not present

## 2019-08-14 DIAGNOSIS — M25511 Pain in right shoulder: Secondary | ICD-10-CM | POA: Diagnosis not present

## 2019-08-14 LAB — BASIC METABOLIC PANEL
Anion gap: 10 (ref 5–15)
BUN: 27 mg/dL — ABNORMAL HIGH (ref 8–23)
CO2: 24 mmol/L (ref 22–32)
Calcium: 9.2 mg/dL (ref 8.9–10.3)
Chloride: 108 mmol/L (ref 98–111)
Creatinine, Ser: 0.98 mg/dL (ref 0.44–1.00)
GFR calc Af Amer: >60 mL/min (ref >60)
GFR calc non Af Amer: 54 mL/min — ABNORMAL LOW (ref >60)
Glucose, Bld: 80 mg/dL (ref 70–99)
Potassium: 4 mmol/L (ref 3.5–5.1)
Sodium: 142 mmol/L (ref 135–145)

## 2019-08-14 LAB — CBC
HCT: 39.5 % (ref 36.0–46.0)
Hemoglobin: 13 g/dL (ref 12.0–15.0)
MCH: 31.9 pg (ref 26.0–34.0)
MCHC: 32.9 g/dL (ref 30.0–36.0)
MCV: 96.8 fL (ref 80.0–100.0)
Platelets: 186 10*3/uL (ref 150–400)
RBC: 4.08 MIL/uL (ref 3.87–5.11)
RDW: 12.7 % (ref 11.5–15.5)
WBC: 7.2 10*3/uL (ref 4.0–10.5)
nRBC: 0 % (ref 0.0–0.2)

## 2019-08-14 LAB — TROPONIN I (HIGH SENSITIVITY): Troponin I (High Sensitivity): 8 ng/L (ref <18)

## 2019-08-14 MED ORDER — TETANUS-DIPHTH-ACELL PERTUSSIS 5-2.5-18.5 LF-MCG/0.5 IM SUSP
0.5000 mL | Freq: Once | INTRAMUSCULAR | Status: AC
  Administered 2019-08-14: 0.5 mL via INTRAMUSCULAR
  Filled 2019-08-14: qty 0.5

## 2019-08-14 NOTE — ED Triage Notes (Signed)
Pt reports she has recently seen PCP for dizziness, diagnosed with vertigo, taking antivert. Pt woke this AM and fell when going to bathroom, hitting right side of head and right shoulder. Pt denies dizziness currently.

## 2019-08-14 NOTE — ED Provider Notes (Signed)
[facility] Emergency Department Provider Note  ____________________________________________  Time seen: Approximately 8:25 AM  I have reviewed the triage vital signs and the nursing notes.   HISTORY  Chief Complaint Right face pain, right shoulder pain   HPI Sandra Davidson is a 83 y.o. female with a history of skin cancer, GERD, hypertension who comes to the ED after falling out of bed this morning while getting up.  She was recently seen by her doctor yesterday for dizziness, found to have borderline low blood pressure.  She was instructed to discontinue her benazepril for the next few days.  She did not take it last night.  This morning when she woke up, she felt totally fine, but on swinging her legs to get out of bed, they got caught in the comforter causing her to fall sideways out of bed.  She hit her right elbow, right shoulder, right jaw on her nightstand.  No loss of consciousness, vision change, neck pain, paresthesia, or weakness.  Does not take blood thinners.  She denies any dizziness at all before or after the fall.  She feels totally normal right now.  She sustained a wound to the right lateral elbow which she dressed at home.  She is not sure when her last tetanus shot was but believes it was many years ago.      Past Medical History:  Diagnosis Date  . Allergy   . Anxiety   . Arthritis   . Cancer ([place])    skin  . Depression   . GERD (gastroesophageal reflux disease)   . Hypertension   . Migraines      Patient Active Problem List   Diagnosis Date Noted  . Cellulitis of right lower extremity without foot 08/02/2019  . Impaired fasting glucose 09/03/2018  . Lumbosacral radiculopathy due to intervertebral disc disorder 09/03/2018  . Primary osteoarthritis of both knees 07/27/2018  . Encounter for general adult medical examination with abnormal findings 07/18/2018  . Lower extremity pain, diffuse 07/18/2018  . Generalized anxiety disorder  07/18/2018  . Need for vaccination against Streptococcus pneumoniae using pneumococcal conjugate vaccine 13 07/18/2018  . Bradycardia 06/18/2018  . LBBB (left bundle branch block) 05/19/2018  . Dysuria 04/22/2018  . Restless leg syndrome, controlled 04/22/2018  . Benign essential HTN 01/08/2018  . Vertigo 01/07/2018  . Chest pain 01/07/2018  . UTI (urinary tract infection) 01/07/2018  . Abnormal chest x-ray 01/07/2018  . Prediabetes 05/16/2017  . Onychomycosis 05/16/2017  . Essential hypertension 02/13/2017  . Skin cancer 02/13/2017  . Anxiety and depression 02/13/2017  . Osteoarthritis 02/13/2017  . Chronic venous insufficiency 09/26/2016  . Lumbar back pain 09/26/2016  . Neuropathy 09/26/2016     Past Surgical History:  Procedure Laterality Date  . BACK SURGERY    . MOHS SURGERY    . NECK SURGERY    . TONSILLECTOMY       Prior to Admission medications   Medication Sig Start Date End Date Taking? Authorizing Provider  ALPRAZolam Duanne Moron) 0.5 MG tablet Take 1 tablet (0.5 mg total) by mouth daily as needed for anxiety. for anxiety 05/06/19   Ronnell Freshwater, NP  benazepril (LOTENSIN) 20 MG tablet Take 1 tablet (20 mg total) by mouth daily. 07/08/19   Ronnell Freshwater, NP  ciprofloxacin-dexamethasone (CIPRODEX) OTIC suspension Place 4 drops into the right ear 2 (two) times daily. 08/13/19   Ronnell Freshwater, NP  etodolac (LODINE) 400 MG tablet TAKE 1 TABLET BY MOUTH  ONCE A DAY 02/26/18   Leone Haven, MD  ibuprofen (ADVIL,MOTRIN) 600 MG tablet Take 1 tablet (600 mg total) by mouth every 8 (eight) hours as needed. 10/01/18   Ronnell Freshwater, NP  meclizine (ANTIVERT) 25 MG tablet Take 1 tablet (25 mg total) by mouth 3 (three) times daily as needed for dizziness. 08/13/19   Ronnell Freshwater, NP  rOPINIRole (REQUIP) 1 MG tablet Take 1 to 2 tablets po QHS prn restless legs . 05/06/19   Ronnell Freshwater, NP  spironolactone (ALDACTONE) 25 MG tablet Take 1 tablet (25 mg total) by  mouth daily. 05/06/19   Ronnell Freshwater, NP     Allergies Codeine, Demerol [meperidine], Doxycycline, Penicillins, and Sulfa antibiotics   Family History  Problem Relation Age of Onset  . Congestive Heart Failure Mother   . COPD Mother   . Emphysema Mother   . Hypertension Mother   . Kidney disease Mother   . Cancer Father   . Kidney disease Father   . Bladder Cancer Neg Hx     Social History Social History   Tobacco Use  . Smoking status: Former Smoker    Types: Cigarettes  . Smokeless tobacco: Never Used  Substance Use Topics  . Alcohol use: No  . Drug use: No    Review of Systems  Constitutional:   No fever or chills.  ENT:   No sore throat. No rhinorrhea.  Right-sided jaw pain Cardiovascular:   No chest pain or syncope. Respiratory:   No dyspnea or cough. Gastrointestinal:   Negative for abdominal pain, vomiting and diarrhea.  Musculoskeletal:   Right elbow pain, right shoulder pain All other systems reviewed and are negative except as documented above in ROS and HPI.  ____________________________________________   PHYSICAL EXAM:  VITAL SIGNS: ED Triage Vitals [08/14/19 0606]  Enc Vitals Group     BP (!) 140/50     Pulse Rate (!) 59     Resp 17     Temp 99 F (37.2 C)     Temp Source Oral     SpO2 99 %     Weight      Height      Head Circumference      Peak Flow      Pain Score      Pain Loc      Pain Edu?      Excl. in [place]?     Vital signs reviewed, nursing assessments reviewed.   Constitutional:   Alert and oriented. Non-toxic appearance. Eyes:   Conjunctivae are normal. EOMI. PERRL. ENT      Head:   Normocephalic and atraumatic.  Very slight bruising on the right auricle.  No laceration.  External canal and TM are normal, no hemotympanum.      Nose:   No congestion/rhinnorhea.  No epistaxis or septal hematoma      Mouth/Throat:   MMM, no pharyngeal erythema. No peritonsillar mass.  No intraoral injury.      Neck:   No meningismus.  Full ROM.  No midline spinal tenderness Hematological/Lymphatic/Immunilogical:   No cervical lymphadenopathy. Cardiovascular:   RRR. Symmetric bilateral radial and DP pulses.  No murmurs. Cap refill less than 2 seconds. Respiratory:   Normal respiratory effort without tachypnea/retractions. Breath sounds are clear and equal bilaterally. No wheezes/rales/rhonchi. Gastrointestinal:   Soft and nontender. Non distended. There is no CVA tenderness.  No rebound, rigidity, or guarding.  Musculoskeletal:   Normal range of motion in  all extremities. No joint effusions.  No lower extremity tenderness.  No edema.  No bony point tenderness Neurologic:   Normal speech and language.  Motor grossly intact. No acute focal neurologic deficits are appreciated.  Skin:    Skin is warm, dry with a 2 cm superficial skin tear over the right lateral elbow.  No rash noted.  No petechiae, purpura, or bullae.  No other bruising noted  ____________________________________________    LABS (pertinent positives/negatives) (all labs ordered are listed, but only abnormal results are displayed) Labs Reviewed  BASIC METABOLIC PANEL - Abnormal; Notable for the following components:      Result Value   BUN 27 (*)    GFR calc non Af Amer 54 (*)    All other components within normal limits  CBC  URINALYSIS, COMPLETE (UACMP) WITH MICROSCOPIC  CBG MONITORING, ED  TROPONIN I (HIGH SENSITIVITY)   ____________________________________________   EKG  Interpreted by me Status rated cardia rate of 58, normal axis and intervals.  Normal QRS ST segments and T waves.  Left bundle branch block.  No acute ischemic changes.  ____________________________________________    [id number]  Dg Shoulder Right  Result Date: 08/14/2019 CLINICAL DATA:  Fall this morning. Right shoulder pain and tenderness. Initial encounter. EXAM: RIGHT SHOULDER - 2+ VIEW COMPARISON:  None. FINDINGS: There is no evidence of fracture or dislocation. Mild  acromioclavicular degenerative changes are seen. Generalized osteopenia noted. No focal bone lesions identified. IMPRESSION: No acute findings. Mild acromioclavicular degenerative changes. Electronically Signed   By: Marlaine Hind M.D.   On: 08/14/2019 07:37   Ct Head Wo Contrast  Result Date: 08/14/2019 CLINICAL DATA:  Dizziness with head trauma EXAM: CT HEAD WITHOUT CONTRAST TECHNIQUE: Contiguous axial images were obtained from the base of the skull through the vertex without intravenous contrast. COMPARISON:  Brain MRI 01/07/2018 FINDINGS: Brain: No evidence of acute infarction, hemorrhage, hydrocephalus, extra-axial collection or mass lesion/mass effect. Mild cerebral volume loss. Vascular: Atherosclerotic calcification Skull: Negative for fracture Sinuses/Orbits: Negative IMPRESSION: No evidence of intracranial injury. Electronically Signed   By: Monte Fantasia M.D.   On: 08/14/2019 07:31    ____________________________________________   PROCEDURES Procedures  ____________________________________________  DIFFERENTIAL DIAGNOSIS   Subdural hematoma, epidural hematoma, right shoulder fracture  CLINICAL IMPRESSION / ASSESSMENT AND PLAN / ED COURSE  Medications ordered in the ED: Medications  Tdap (BOOSTRIX) injection 0.5 mL (has no administration in time range)    Pertinent labs & imaging results that were available during my care of the patient were reviewed by me and considered in my medical decision making (see chart for details).  Sandra Davidson was evaluated in Emergency Department on 08/14/2019 for the symptoms described in the history of present illness. She was evaluated in the context of the global COVID-19 pandemic, which necessitated consideration that the patient might be at risk for infection with the SARS-CoV-2 virus that causes COVID-19. Institutional protocols and algorithms that pertain to the evaluation of patients at risk for COVID-19 are in a state of rapid change based  on information released by regulatory bodies including the CDC and federal and state organizations. These policies and algorithms were followed during the patient's care in the ED.   Patient presents after mechanical fall from bed.  Due to age and mechanism, CT imaging needed to evaluate for intracranial hemorrhage.  CT is negative and reassuring.  X-ray of the shoulder is also unremarkable.  Exam is overall reassuring without evidence of other serious traumatic injury.  She has a small skin tear over the right lateral elbow which is dressed in the ED, tetanus updated, stable for discharge home.  Vital signs unremarkable      ____________________________________________   FINAL CLINICAL IMPRESSION(S) / ED DIAGNOSES    Final diagnoses:  Facial contusion, initial encounter  Skin tear of elbow   ED Discharge Orders    None      Portions of this note were generated with dragon dictation software. Dictation errors may occur despite best attempts at proofreading.   Carrie Mew, MD 08/14/19 [phone]

## 2019-08-14 NOTE — ED Notes (Signed)
ED Provider at bedside. 

## 2019-08-25 DIAGNOSIS — H6690 Otitis media, unspecified, unspecified ear: Secondary | ICD-10-CM | POA: Insufficient documentation

## 2019-09-02 ENCOUNTER — Other Ambulatory Visit: Payer: Self-pay

## 2019-09-02 ENCOUNTER — Ambulatory Visit (INDEPENDENT_AMBULATORY_CARE_PROVIDER_SITE_OTHER): Payer: PPO | Admitting: Adult Health

## 2019-09-02 ENCOUNTER — Encounter: Payer: Self-pay | Admitting: Adult Health

## 2019-09-02 VITALS — BP 116/72 | HR 58 | Resp 16 | Ht 66.5 in | Wt 123.0 lb

## 2019-09-02 DIAGNOSIS — I10 Essential (primary) hypertension: Secondary | ICD-10-CM | POA: Diagnosis not present

## 2019-09-02 DIAGNOSIS — R42 Dizziness and giddiness: Secondary | ICD-10-CM

## 2019-09-02 DIAGNOSIS — Z23 Encounter for immunization: Secondary | ICD-10-CM | POA: Diagnosis not present

## 2019-09-02 NOTE — Progress Notes (Signed)
[facility] [place], [place] [address]  Internal MEDICINE  Office Visit Note  Patient Name: Sandra Davidson  [id number]  [id number]  Date of Service: 09/02/2019  Chief Complaint  Patient presents with  . Medical Management of Chronic Issues    2 month follow up , reduced dose of banazapril   . Hypertension  . Dizziness    still coming and going     HPI  Pt is here for follow up on blood pressure. She reports her dizziness has improved drastically since starting meclizine.  She did have a fall, that she had to go to the ER for.  Her work up was negative.  She denies any other issues currently.  She is taking 10mg  of benzapirl and spirnolactone as directed.     Current Medication: Outpatient Encounter Medications as of 09/02/2019  Medication Sig  . ALPRAZolam (XANAX) 0.5 MG tablet Take 1 tablet (0.5 mg total) by mouth daily as needed for anxiety. for anxiety  . benazepril (LOTENSIN) 20 MG tablet Take 1 tablet (20 mg total) by mouth daily. (Patient taking differently: Take 20 mg by mouth daily. 1/2 tab)  . ciprofloxacin-dexamethasone (CIPRODEX) OTIC suspension Place 4 drops into the right ear 2 (two) times daily.  Marland Kitchen etodolac (LODINE) 400 MG tablet TAKE 1 TABLET BY MOUTH ONCE A DAY  . ibuprofen (ADVIL,MOTRIN) 600 MG tablet Take 1 tablet (600 mg total) by mouth every 8 (eight) hours as needed.  . meclizine (ANTIVERT) 25 MG tablet Take 1 tablet (25 mg total) by mouth 3 (three) times daily as needed for dizziness.  Marland Kitchen rOPINIRole (REQUIP) 1 MG tablet Take 1 to 2 tablets po QHS prn restless legs .  . spironolactone (ALDACTONE) 25 MG tablet Take 1 tablet (25 mg total) by mouth daily.   No facility-administered encounter medications on file as of 09/02/2019.     Surgical History: Past Surgical History:  Procedure Laterality Date  . BACK SURGERY    . MOHS SURGERY    . NECK SURGERY    . TONSILLECTOMY      Medical History: Past Medical History:  Diagnosis Date   . Allergy   . Anxiety   . Arthritis   . Cancer ([place])    skin  . Depression   . GERD (gastroesophageal reflux disease)   . Hypertension   . Migraines     Family History: Family History  Problem Relation Age of Onset  . Congestive Heart Failure Mother   . COPD Mother   . Emphysema Mother   . Hypertension Mother   . Kidney disease Mother   . Cancer Father   . Kidney disease Father   . Bladder Cancer Neg Hx     Social History   Socioeconomic History  . Marital status: Divorced    Spouse name: Not on file  . Number of children: Not on file  . Years of education: Not on file  . Highest education level: Not on file  Occupational History  . Not on file  Social Needs  . Financial resource strain: Not on file  . Food insecurity    Worry: Not on file    Inability: Not on file  . Transportation needs    Medical: Not on file    Non-medical: Not on file  Tobacco Use  . Smoking status: Former Smoker    Types: Cigarettes  . Smokeless tobacco: Never Used  Substance and Sexual Activity  . Alcohol use: No  .  Drug use: No  . Sexual activity: Not Currently  Lifestyle  . Physical activity    Days per week: Not on file    Minutes per session: Not on file  . Stress: Not on file  Relationships  . Social Herbalist on phone: Not on file    Gets together: Not on file    Attends religious service: Not on file    Active member of club or organization: Not on file    Attends meetings of clubs or organizations: Not on file    Relationship status: Not on file  . Intimate partner violence    Fear of current or ex partner: Not on file    Emotionally abused: Not on file    Physically abused: Not on file    Forced sexual activity: Not on file  Other Topics Concern  . Not on file  Social History Narrative  . Not on file      Review of Systems  Constitutional: Negative for chills, fatigue and unexpected weight change.  HENT: Negative for congestion, rhinorrhea,  sneezing and sore throat.   Eyes: Negative for photophobia, pain and redness.  Respiratory: Negative for cough, chest tightness and shortness of breath.   Cardiovascular: Negative for chest pain and palpitations.  Gastrointestinal: Negative for abdominal pain, constipation, diarrhea, nausea and vomiting.  Endocrine: Negative.   Genitourinary: Negative for dysuria and frequency.  Musculoskeletal: Negative for arthralgias, back pain, joint swelling and neck pain.  Skin: Negative for rash.  Allergic/Immunologic: Negative.   Neurological: Negative for tremors and numbness.  Hematological: Negative for adenopathy. Does not bruise/bleed easily.  Psychiatric/Behavioral: Negative for behavioral problems and sleep disturbance. The patient is not nervous/anxious.     Vital Signs: BP 116/72   Pulse (!) 58   Resp 16   Ht 5' 6.5" (1.689 m)   Wt 123 lb (55.8 kg)   SpO2 98%   BMI 19.56 kg/m    Physical Exam Vitals signs and nursing note reviewed.  Constitutional:      General: She is not in acute distress.    Appearance: She is well-developed. She is not diaphoretic.  HENT:     Head: Normocephalic and atraumatic.     Mouth/Throat:     Pharynx: No oropharyngeal exudate.  Eyes:     Pupils: Pupils are equal, round, and reactive to light.  Neck:     Musculoskeletal: Normal range of motion and neck supple.     Thyroid: No thyromegaly.     Vascular: No JVD.     Trachea: No tracheal deviation.  Cardiovascular:     Rate and Rhythm: Normal rate and regular rhythm.     Heart sounds: Normal heart sounds. No murmur. No friction rub. No gallop.   Pulmonary:     Effort: Pulmonary effort is normal. No respiratory distress.     Breath sounds: Normal breath sounds. No wheezing or rales.  Chest:     Chest wall: No tenderness.  Abdominal:     Palpations: Abdomen is soft.     Tenderness: There is no abdominal tenderness. There is no guarding.  Musculoskeletal: Normal range of motion.   Lymphadenopathy:     Cervical: No cervical adenopathy.  Skin:    General: Skin is warm and dry.  Neurological:     Mental Status: She is alert and oriented to person, place, and time.     Cranial Nerves: No cranial nerve deficit.  Psychiatric:  Behavior: Behavior normal.        Thought Content: Thought content normal.        Judgment: Judgment normal.     Assessment/Plan: 1. Vertigo Improved with Meclizine, some episodes persist, but much more manageable at this time.  Will continue to follow up.   2. Essential hypertension Stable, continue present management.   3. Flu vaccine need Flu vaccine provided at this time.   General Counseling: Sandra Davidson verbalizes understanding of the findings of todays visit and agrees with plan of treatment. I have discussed any further diagnostic evaluation that may be needed or ordered today. We also reviewed her medications today. she has been encouraged to call the office with any questions or concerns that should arise related to todays visit.    No orders of the defined types were placed in this encounter.   No orders of the defined types were placed in this encounter.   Time spent: 20 Minutes   This patient was seen by Orson Gear AGNP-C in Collaboration with Dr Lavera Guise as a part of collaborative care agreement     Kendell Bane AGNP-C Internal medicine

## 2019-10-06 DIAGNOSIS — M79604 Pain in right leg: Secondary | ICD-10-CM | POA: Diagnosis not present

## 2019-10-06 DIAGNOSIS — M79605 Pain in left leg: Secondary | ICD-10-CM | POA: Diagnosis not present

## 2019-10-06 DIAGNOSIS — I87393 Chronic venous hypertension (idiopathic) with other complications of bilateral lower extremity: Secondary | ICD-10-CM | POA: Diagnosis not present

## 2019-10-06 DIAGNOSIS — G2581 Restless legs syndrome: Secondary | ICD-10-CM | POA: Diagnosis not present

## 2019-10-07 DIAGNOSIS — G2581 Restless legs syndrome: Secondary | ICD-10-CM | POA: Diagnosis not present

## 2019-10-07 DIAGNOSIS — I87393 Chronic venous hypertension (idiopathic) with other complications of bilateral lower extremity: Secondary | ICD-10-CM | POA: Diagnosis not present

## 2019-10-07 DIAGNOSIS — M79604 Pain in right leg: Secondary | ICD-10-CM | POA: Diagnosis not present

## 2019-10-07 DIAGNOSIS — M79605 Pain in left leg: Secondary | ICD-10-CM | POA: Diagnosis not present

## 2019-10-08 ENCOUNTER — Other Ambulatory Visit: Payer: Self-pay

## 2019-10-08 ENCOUNTER — Other Ambulatory Visit: Payer: Self-pay | Admitting: Family Medicine

## 2019-10-08 DIAGNOSIS — I10 Essential (primary) hypertension: Secondary | ICD-10-CM

## 2019-10-08 DIAGNOSIS — M5417 Radiculopathy, lumbosacral region: Secondary | ICD-10-CM

## 2019-10-08 DIAGNOSIS — G2581 Restless legs syndrome: Secondary | ICD-10-CM

## 2019-10-08 MED ORDER — IBUPROFEN 600 MG PO TABS: 600.0000 mg | ORAL_TABLET | Freq: Three times a day (TID) | ORAL | 3 refills | Status: DC | PRN

## 2019-10-08 MED ORDER — ROPINIROLE HCL 1 MG PO TABS: ORAL_TABLET | ORAL | 3 refills | Status: DC

## 2019-10-08 MED ORDER — BENAZEPRIL HCL 20 MG PO TABS: 20.0000 mg | ORAL_TABLET | Freq: Every day | ORAL | 0 refills | Status: DC

## 2019-10-12 DIAGNOSIS — G5601 Carpal tunnel syndrome, right upper limb: Secondary | ICD-10-CM | POA: Diagnosis not present

## 2019-10-12 DIAGNOSIS — M170 Bilateral primary osteoarthritis of knee: Secondary | ICD-10-CM | POA: Diagnosis not present

## 2019-10-14 ENCOUNTER — Ambulatory Visit (INDEPENDENT_AMBULATORY_CARE_PROVIDER_SITE_OTHER): Payer: PPO | Admitting: Nurse Practitioner

## 2019-10-14 ENCOUNTER — Encounter: Payer: Self-pay | Admitting: Nurse Practitioner

## 2019-10-14 ENCOUNTER — Other Ambulatory Visit: Payer: Self-pay

## 2019-10-14 VITALS — BP 128/53 | HR 58 | Temp 97.4°F | Resp 16 | Ht 66.0 in | Wt 124.4 lb

## 2019-10-14 DIAGNOSIS — M5417 Radiculopathy, lumbosacral region: Secondary | ICD-10-CM

## 2019-10-14 DIAGNOSIS — M79606 Pain in leg, unspecified: Secondary | ICD-10-CM | POA: Diagnosis not present

## 2019-10-14 DIAGNOSIS — Z79899 Other long term (current) drug therapy: Secondary | ICD-10-CM

## 2019-10-14 DIAGNOSIS — F411 Generalized anxiety disorder: Secondary | ICD-10-CM

## 2019-10-14 DIAGNOSIS — G2581 Restless legs syndrome: Secondary | ICD-10-CM

## 2019-10-14 DIAGNOSIS — R42 Dizziness and giddiness: Secondary | ICD-10-CM | POA: Diagnosis not present

## 2019-10-14 LAB — POCT URINE DRUG SCREEN
POC Amphetamine UR: NOT DETECTED
POC BENZODIAZEPINES UR: POSITIVE — AB
POC Barbiturate UR: NOT DETECTED
POC Cocaine UR: NOT DETECTED
POC Ecstasy UR: NOT DETECTED
POC Marijuana UR: NOT DETECTED
POC Methadone UR: NOT DETECTED
POC Methamphetamine UR: NOT DETECTED
POC Opiate Ur: NOT DETECTED
POC Oxycodone UR: NOT DETECTED
POC PHENCYCLIDINE UR: NOT DETECTED
POC TRICYCLICS UR: NOT DETECTED

## 2019-10-14 MED ORDER — ROPINIROLE HCL 1 MG PO TABS: ORAL_TABLET | ORAL | 3 refills | Status: DC

## 2019-10-14 MED ORDER — ALPRAZOLAM 0.5 MG PO TABS: 0.5000 mg | ORAL_TABLET | Freq: Every day | ORAL | 3 refills | Status: DC | PRN

## 2019-10-14 MED ORDER — ETODOLAC 400 MG PO TABS: 400.0000 mg | ORAL_TABLET | Freq: Every day | ORAL | 1 refills | Status: DC

## 2019-10-14 MED ORDER — GABAPENTIN 100 MG PO CAPS: ORAL_CAPSULE | ORAL | 3 refills | Status: DC

## 2019-10-14 MED ORDER — MECLIZINE HCL 25 MG PO TABS: 25.0000 mg | ORAL_TABLET | Freq: Three times a day (TID) | ORAL | 3 refills | Status: AC | PRN

## 2019-10-14 NOTE — Progress Notes (Signed)
[facility] [place], [place] [address]  Internal MEDICINE  Office Visit Note  Patient Name: Sandra Davidson  [id number]  [id number]  Date of Service: 10/27/2019  Chief Complaint  Patient presents with  . Hypertension  . Gastroesophageal Reflux  . Medication Refill    lodine ( pt states she does not take it all the time) and meclizine   . Medication Management    pt saw a provider and stated that pt take an antipsychotic drug for her leg pain     The patient is here for follow up visit. She continues to have bilateral lower leg pain and restless legs. She did see provider at [facility]. She was offered a prescription for anti-depressant. She does not wish to do this at all. She does have some anxiety, but does not feel depressed and does not wish to start this at all. She does take ropinirole. Takes one to two tablets at night when needed for restless legs syndrome. She has tried gabapentin at low dose in the past.  Stopped taking this as it did not do any better than ropinirole. Did not take these together, ever. She sees orthopedic provider for osteoarthritis in both knees.        Current Medication: Outpatient Encounter Medications as of 10/14/2019  Medication Sig  . benazepril (LOTENSIN) 20 MG tablet Take 1 tablet (20 mg total) by mouth daily.  Marland Kitchen etodolac (LODINE) 400 MG tablet Take 1 tablet (400 mg total) by mouth daily.  Marland Kitchen ibuprofen (ADVIL) 600 MG tablet Take 1 tablet (600 mg total) by mouth every 8 (eight) hours as needed.  . meclizine (ANTIVERT) 25 MG tablet Take 1 tablet (25 mg total) by mouth 3 (three) times daily as needed for dizziness.  . nitrofurantoin (MACRODANTIN) 100 MG capsule Take 100 mg by mouth daily.  Marland Kitchen rOPINIRole (REQUIP) 1 MG tablet Take 1 to 2 tablets po QHS prn restless legs .  . spironolactone (ALDACTONE) 25 MG tablet Take 1 tablet (25 mg total) by mouth daily.  . [DISCONTINUED] ALPRAZolam (XANAX) 0.5 MG tablet  Take 1 tablet (0.5 mg total) by mouth daily as needed for anxiety. for anxiety  . [DISCONTINUED] ALPRAZolam (XANAX) 0.5 MG tablet Take 1 tablet (0.5 mg total) by mouth daily as needed for anxiety. for anxiety  . [DISCONTINUED] ciprofloxacin-dexamethasone (CIPRODEX) OTIC suspension Place 4 drops into the right ear 2 (two) times daily.  . [DISCONTINUED] etodolac (LODINE) 400 MG tablet TAKE 1 TABLET BY MOUTH ONCE A DAY  . [DISCONTINUED] meclizine (ANTIVERT) 25 MG tablet Take 1 tablet (25 mg total) by mouth 3 (three) times daily as needed for dizziness.  . [DISCONTINUED] rOPINIRole (REQUIP) 1 MG tablet Take 1 to 2 tablets po QHS prn restless legs .  . gabapentin (NEURONTIN) 100 MG capsule Take 1 to 2 capsules po QPM for RLS  . [DISCONTINUED] doxycycline (VIBRAMYCIN) 100 MG capsule   . [DISCONTINUED] mupirocin ointment (BACTROBAN) 2 %    No facility-administered encounter medications on file as of 10/14/2019.     Surgical History: Past Surgical History:  Procedure Laterality Date  . BACK SURGERY    . MOHS SURGERY    . NECK SURGERY    . TONSILLECTOMY      Medical History: Past Medical History:  Diagnosis Date  . Allergy   . Anxiety   . Arthritis   . Cancer ([place])    skin  . Depression   . GERD (gastroesophageal reflux disease)   .  Hypertension   . Migraines     Family History: Family History  Problem Relation Age of Onset  . Congestive Heart Failure Mother   . COPD Mother   . Emphysema Mother   . Hypertension Mother   . Kidney disease Mother   . Cancer Father   . Kidney disease Father   . Bladder Cancer Neg Hx     Social History   Socioeconomic History  . Marital status: Divorced    Spouse name: Not on file  . Number of children: Not on file  . Years of education: Not on file  . Highest education level: Not on file  Occupational History  . Not on file  Social Needs  . Financial resource strain: Not on file  . Food insecurity    Worry: Not on file    Inability:  Not on file  . Transportation needs    Medical: Not on file    Non-medical: Not on file  Tobacco Use  . Smoking status: Former Smoker    Types: Cigarettes  . Smokeless tobacco: Never Used  Substance and Sexual Activity  . Alcohol use: No  . Drug use: No  . Sexual activity: Not Currently  Lifestyle  . Physical activity    Days per week: Not on file    Minutes per session: Not on file  . Stress: Not on file  Relationships  . Social Herbalist on phone: Not on file    Gets together: Not on file    Attends religious service: Not on file    Active member of club or organization: Not on file    Attends meetings of clubs or organizations: Not on file    Relationship status: Not on file  . Intimate partner violence    Fear of current or ex partner: Not on file    Emotionally abused: Not on file    Physically abused: Not on file    Forced sexual activity: Not on file  Other Topics Concern  . Not on file  Social History Narrative  . Not on file      Review of Systems  Constitutional: Negative for activity change, chills, fatigue and unexpected weight change.  HENT: Negative for congestion, rhinorrhea, sneezing and sore throat.   Respiratory: Negative for cough, chest tightness, shortness of breath and wheezing.   Cardiovascular: Positive for leg swelling. Negative for chest pain and palpitations.       Bilateral lower leg pain.   Gastrointestinal: Negative for abdominal pain, constipation, diarrhea, nausea and vomiting.  Endocrine: Negative for cold intolerance, heat intolerance, polydipsia and polyuria.  Musculoskeletal: Positive for arthralgias and myalgias. Negative for back pain, joint swelling and neck pain.       Bilateral knee pain   Skin: Negative for rash.  Allergic/Immunologic: Negative for environmental allergies.  Neurological: Negative for dizziness, tremors, numbness and headaches.  Hematological: Negative for adenopathy. Does not bruise/bleed easily.   Psychiatric/Behavioral: Negative for behavioral problems and sleep disturbance. The patient is nervous/anxious.    Today's Vitals   10/14/19 0950  BP: (!) 128/53  Pulse: (!) 58  Resp: 16  Temp: (!) 97.4 F (36.3 C)  SpO2: 99%  Weight: 124 lb 6.4 oz (56.4 kg)  Height: 5\' 6"  (1.676 m)   Body mass index is 20.08 kg/m.  Physical Exam Vitals signs and nursing note reviewed.  Constitutional:      General: She is not in acute distress.    Appearance: Normal  appearance. She is well-developed. She is not diaphoretic.  HENT:     Head: Normocephalic and atraumatic.     Nose: Nose normal.     Mouth/Throat:     Pharynx: No oropharyngeal exudate.  Eyes:     Conjunctiva/sclera: Conjunctivae normal.     Pupils: Pupils are equal, round, and reactive to light.  Neck:     Musculoskeletal: Normal range of motion and neck supple.     Thyroid: No thyromegaly.     Vascular: No carotid bruit or JVD.     Trachea: No tracheal deviation.  Cardiovascular:     Rate and Rhythm: Normal rate and regular rhythm.     Pulses: Normal pulses.     Heart sounds: Normal heart sounds. No murmur. No friction rub. No gallop.      Comments: Bilateral leg pain, lower extremities feel cool to the touch. Pulses are palpable, bilaterally. She has good capillary refill. No edema is present in either foot or lower leg.  Pulmonary:     Effort: Pulmonary effort is normal. No respiratory distress.     Breath sounds: Normal breath sounds. No wheezing or rales.  Chest:     Chest wall: No tenderness.  Abdominal:     General: Bowel sounds are normal.     Palpations: Abdomen is soft.     Tenderness: There is no abdominal tenderness.  Musculoskeletal: Normal range of motion.     Comments:  Bilateral knee pain/tenderness. ROM and strength is intact at this time.   Lymphadenopathy:     Cervical: No cervical adenopathy.  Skin:    General: Skin is warm and dry.     Capillary Refill: Capillary refill takes 2 to 3 seconds.      Comments: Skin of bilateral lower extremities is cool to touch.   Neurological:     General: No focal deficit present.     Mental Status: She is alert and oriented to person, place, and time.     Cranial Nerves: No cranial nerve deficit.  Psychiatric:        Behavior: Behavior normal.        Thought Content: Thought content normal.        Judgment: Judgment normal.   Assessment/Plan: 1. Restless leg syndrome, controlled Stable. Continue requip 1 to 2mg  at bedtime  - rOPINIRole (REQUIP) 1 MG tablet; Take 1 to 2 tablets po QHS prn restless legs .  Dispense: 60 tablet; Refill: 3  2. Lower extremity pain, diffuse, unspecified laterality May continue lodine 400mg  daily as needed for pain/inflammation.  - etodolac (LODINE) 400 MG tablet; Take 1 tablet (400 mg total) by mouth daily.  Dispense: 90 tablet; Refill: 1  3. Lumbosacral radiculopathy due to intervertebral disc disorder Gabapentin 100mg  - may take one to two capsules in the evening as needed  - gabapentin (NEURONTIN) 100 MG capsule; Take 1 to 2 capsules po QPM for RLS  Dispense: 60 capsule; Refill: 3  4. Generalized anxiety disorder Ok to take alprazolam 0.5mg  once daily if needed for acute anxiety.   5. Vertigo - meclizine (ANTIVERT) 25 MG tablet; Take 1 tablet (25 mg total) by mouth 3 (three) times daily as needed for dizziness.  Dispense: 30 tablet; Refill: 3  6. Encounter for long-term (current) use of medications - POCT Urine Drug Screen appropriately positive for [facility] only.   General Counseling: Melany verbalizes understanding of the findings of todays visit and agrees with plan of treatment. I have discussed any further diagnostic evaluation  that may be needed or ordered today. We also reviewed her medications today. she has been encouraged to call the office with any questions or concerns that should arise related to todays visit.  This patient was seen by Leretha Pol FNP Collaboration with Dr Lavera Guise as a part of  collaborative care agreement  Orders Placed This Encounter  Procedures  . POCT Urine Drug Screen    Meds ordered this encounter  Medications  . DISCONTD: ALPRAZolam (XANAX) 0.5 MG tablet    Sig: Take 1 tablet (0.5 mg total) by mouth daily as needed for anxiety. for anxiety    Dispense:  30 tablet    Refill:  3    Order Specific Question:   Supervising Provider    Answer:   Lavera Guise [id number]  . etodolac (LODINE) 400 MG tablet    Sig: Take 1 tablet (400 mg total) by mouth daily.    Dispense:  90 tablet    Refill:  1    Order Specific Question:   Supervising Provider    Answer:   Lavera Guise [id number]  . meclizine (ANTIVERT) 25 MG tablet    Sig: Take 1 tablet (25 mg total) by mouth 3 (three) times daily as needed for dizziness.    Dispense:  30 tablet    Refill:  3    Order Specific Question:   Supervising Provider    Answer:   Lavera Guise [id number]  . rOPINIRole (REQUIP) 1 MG tablet    Sig: Take 1 to 2 tablets po QHS prn restless legs .    Dispense:  60 tablet    Refill:  3    Please note increased dose    Order Specific Question:   Supervising Provider    Answer:   Lavera Guise [id number]  . gabapentin (NEURONTIN) 100 MG capsule    Sig: Take 1 to 2 capsules po QPM for RLS    Dispense:  60 capsule    Refill:  3    Order Specific Question:   Supervising Provider    Answer:   Lavera Guise [id number]    Time spent: 59 Minutes      Dr Lavera Guise Internal medicine

## 2019-10-15 ENCOUNTER — Telehealth: Payer: Self-pay

## 2019-10-15 ENCOUNTER — Other Ambulatory Visit: Payer: Self-pay | Admitting: Nurse Practitioner

## 2019-10-15 DIAGNOSIS — F411 Generalized anxiety disorder: Secondary | ICD-10-CM

## 2019-10-15 MED ORDER — ALPRAZOLAM 0.5 MG PO TABS: ORAL_TABLET | ORAL | 2 refills | Status: DC

## 2019-10-15 NOTE — Telephone Encounter (Signed)
Changed rx of alprazolam 1mg  - take 1/2 tablet po daily if needed. Sent to [facility].

## 2019-10-15 NOTE — Progress Notes (Signed)
Changed rx of alprazolam 1mg  - take 1/2 tablet po daily if needed. Sent to [facility].

## 2019-10-27 DIAGNOSIS — Z79899 Other long term (current) drug therapy: Secondary | ICD-10-CM | POA: Insufficient documentation

## 2019-11-08 ENCOUNTER — Other Ambulatory Visit: Payer: Self-pay

## 2019-11-08 DIAGNOSIS — F411 Generalized anxiety disorder: Secondary | ICD-10-CM

## 2019-11-08 MED ORDER — ALPRAZOLAM 0.5 MG PO TABS: ORAL_TABLET | ORAL | 2 refills | Status: DC

## 2019-11-08 NOTE — Telephone Encounter (Signed)
Pt called we accidentally send wrong pres for alprazolam 0.5 as per heather called phar and fixed alprazolam 0.5 1 tab daily 30 with 2refills

## 2020-01-07 ENCOUNTER — Other Ambulatory Visit: Payer: Self-pay

## 2020-01-07 DIAGNOSIS — I10 Essential (primary) hypertension: Secondary | ICD-10-CM

## 2020-01-07 MED ORDER — BENAZEPRIL HCL 20 MG PO TABS: 20.0000 mg | ORAL_TABLET | Freq: Every day | ORAL | 0 refills | Status: DC

## 2020-01-18 ENCOUNTER — Ambulatory Visit (INDEPENDENT_AMBULATORY_CARE_PROVIDER_SITE_OTHER): Payer: PPO | Admitting: Nurse Practitioner

## 2020-01-18 ENCOUNTER — Encounter: Payer: Self-pay | Admitting: Nurse Practitioner

## 2020-01-18 VITALS — BP 121/57 | HR 66 | Temp 96.8°F | Ht 66.0 in | Wt 124.0 lb

## 2020-01-18 DIAGNOSIS — M79606 Pain in leg, unspecified: Secondary | ICD-10-CM | POA: Diagnosis not present

## 2020-01-18 DIAGNOSIS — I10 Essential (primary) hypertension: Secondary | ICD-10-CM

## 2020-01-18 DIAGNOSIS — F411 Generalized anxiety disorder: Secondary | ICD-10-CM | POA: Diagnosis not present

## 2020-01-18 DIAGNOSIS — G2581 Restless legs syndrome: Secondary | ICD-10-CM

## 2020-01-18 MED ORDER — ALPRAZOLAM 0.5 MG PO TABS: ORAL_TABLET | ORAL | 2 refills | Status: DC

## 2020-01-18 NOTE — Progress Notes (Signed)
[facility] [place], [place] [address]  Internal MEDICINE  Telephone Visit  Patient Name: Sandra Davidson  [id number]  [id number]  Date of Service: 01/18/2020  I connected with the patient at 11:18am  by telephone and verified the patients identity using two identifiers.   I discussed the limitations, risks, security and privacy concerns of performing an evaluation and management service by telephone and the availability of in person appointments. I also discussed with the patient that there may be a patient responsible charge related to the service.  The patient expressed understanding and agrees to proceed.    Chief Complaint  Patient presents with  . Telephone Assessment  . Telephone Screen  . Hypertension  . Gastroesophageal Reflux  . Depression    The patient has been contacted via telephone for follow up visit due to concerns for spread of novel coronavirus. She presents for routine visit. She states that last week, she had GI virus last week. She drank pedialyte, took her antivert when needed, and tylenol. She is feeling much better. She states that she had first Moderna vaccine for COVID 19 12/29/2019 and will get the second vaccine 01/19/2020. She states that she needs to have a refill of her alprazolam. She takes this at night to help with anxiety which cause insomnia.       Current Medication: Outpatient Encounter Medications as of 01/18/2020  Medication Sig  . ALPRAZolam (XANAX) 0.5 MG tablet Take 1 tablet po QD prn  . benazepril (LOTENSIN) 20 MG tablet Take 1 tablet (20 mg total) by mouth daily.  Marland Kitchen etodolac (LODINE) 400 MG tablet Take 1 tablet (400 mg total) by mouth daily.  Marland Kitchen gabapentin (NEURONTIN) 100 MG capsule Take 1 to 2 capsules po QPM for RLS  . ibuprofen (ADVIL) 600 MG tablet Take 1 tablet (600 mg total) by mouth every 8 (eight) hours as needed.  . meclizine (ANTIVERT) 25 MG tablet Take 1 tablet (25 mg total) by mouth 3 (three) times daily  as needed for dizziness.  . nitrofurantoin (MACRODANTIN) 100 MG capsule Take 100 mg by mouth daily.  Marland Kitchen rOPINIRole (REQUIP) 1 MG tablet Take 1 to 2 tablets po QHS prn restless legs .  . spironolactone (ALDACTONE) 25 MG tablet Take 1 tablet (25 mg total) by mouth daily.  . [DISCONTINUED] ALPRAZolam (XANAX) 0.5 MG tablet Take 1 tablet po QD prn  . [DISCONTINUED] ALPRAZolam (XANAX) 0.5 MG tablet Take 1 tablet po QD prn   No facility-administered encounter medications on file as of 01/18/2020.    Surgical History: Past Surgical History:  Procedure Laterality Date  . BACK SURGERY    . MOHS SURGERY    . NECK SURGERY    . TONSILLECTOMY      Medical History: Past Medical History:  Diagnosis Date  . Allergy   . Anxiety   . Arthritis   . Cancer ([place])    skin  . Depression   . GERD (gastroesophageal reflux disease)   . Hypertension   . Migraines     Family History: Family History  Problem Relation Age of Onset  . Congestive Heart Failure Mother   . COPD Mother   . Emphysema Mother   . Hypertension Mother   . Kidney disease Mother   . Cancer Father   . Kidney disease Father   . Bladder Cancer Neg Hx     Social History   Socioeconomic History  . Marital status: Divorced    Spouse name: Not  on file  . Number of children: Not on file  . Years of education: Not on file  . Highest education level: Not on file  Occupational History  . Not on file  Tobacco Use  . Smoking status: Former Smoker    Types: Cigarettes  . Smokeless tobacco: Never Used  Substance and Sexual Activity  . Alcohol use: No  . Drug use: No  . Sexual activity: Not Currently  Other Topics Concern  . Not on file  Social History Narrative  . Not on file   Social Determinants of Health   Financial Resource Strain:   . Difficulty of Paying Living Expenses: Not on file  Food Insecurity:   . Worried About Charity fundraiser in the Last Year: Not on file  . Ran Out of Food in the Last Year: Not on  file  Transportation Needs:   . Lack of Transportation (Medical): Not on file  . Lack of Transportation (Non-Medical): Not on file  Physical Activity:   . Days of Exercise per Week: Not on file  . Minutes of Exercise per Session: Not on file  Stress:   . Feeling of Stress : Not on file  Social Connections:   . Frequency of Communication with Friends and Family: Not on file  . Frequency of Social Gatherings with Friends and Family: Not on file  . Attends Religious Services: Not on file  . Active Member of Clubs or Organizations: Not on file  . Attends Archivist Meetings: Not on file  . Marital Status: Not on file  Intimate Partner Violence:   . Fear of Current or Ex-Partner: Not on file  . Emotionally Abused: Not on file  . Physically Abused: Not on file  . Sexually Abused: Not on file      Review of Systems  Constitutional: Negative for activity change, chills, fatigue and unexpected weight change.  HENT: Negative for congestion, rhinorrhea, sneezing and sore throat.   Respiratory: Negative for cough, chest tightness, shortness of breath and wheezing.   Cardiovascular: Negative for chest pain, palpitations and leg swelling.  Gastrointestinal: Negative for abdominal pain, constipation, diarrhea, nausea and vomiting.  Endocrine: Negative for cold intolerance, heat intolerance, polydipsia and polyuria.  Musculoskeletal: Positive for arthralgias and myalgias. Negative for back pain, joint swelling and neck pain.       Bilateral knee pain   Skin: Negative for rash.  Allergic/Immunologic: Negative for environmental allergies.  Neurological: Negative for dizziness, tremors, numbness and headaches.  Hematological: Negative for adenopathy. Does not bruise/bleed easily.  Psychiatric/Behavioral: Negative for behavioral problems and sleep disturbance. The patient is nervous/anxious.     Today's Vitals   01/18/20 1028  BP: (!) 121/57  Pulse: 66  Temp: (!) 96.8 F (36 C)   Weight: 124 lb (56.2 kg)  Height: 5\' 6"  (1.676 m)   Body mass index is 20.01 kg/m.  Observation/Objective:   The patient is alert and oriented. She is pleasant and answers all questions appropriately. Breathing is non-labored. She is in no acute distress at this time.    Assessment/Plan: 1. Essential hypertension Stable. cotinue bp medication as prescribed   2. Generalized anxiety disorder May continue alprazolam 0.5mg  daily as needed for acute anxiety. New prescription sent to her pharmacy.  - ALPRAZolam (XANAX) 0.5 MG tablet; Take 1 tablet po QD prn  Dispense: 30 tablet; Refill: 2  3. Restless leg syndrome, controlled Continue ropinirole as prescribed   General Counseling: Jamacia verbalizes understanding of the findings  of today's phone visit and agrees with plan of treatment. I have discussed any further diagnostic evaluation that may be needed or ordered today. We also reviewed her medications today. she has been encouraged to call the office with any questions or concerns that should arise related to todays visit.  Hypertension Counseling:   The following hypertensive lifestyle modification were recommended and discussed:  1. Limiting alcohol intake to less than 1 oz/day of ethanol:(24 oz of beer or 8 oz of wine or 2 oz of 100-proof whiskey). 2. Take baby ASA 81 mg daily. 3. Importance of regular aerobic exercise and losing weight. 4. Reduce dietary saturated fat and cholesterol intake for overall cardiovascular health. 5. Maintaining adequate dietary potassium, calcium, and magnesium intake. 6. Regular monitoring of the blood pressure. 7. Reduce sodium intake to less than 100 mmol/day (less than 2.3 gm of sodium or less than 6 gm of sodium choride)   This patient was seen by [place] with Dr Lavera Guise as a part of collaborative care agreement  Meds ordered this encounter  Medications  . DISCONTD: ALPRAZolam (XANAX) 0.5 MG tablet    Sig: Take 1  tablet po QD prn    Dispense:  30 tablet    Refill:  2    Order Specific Question:   Supervising Provider    Answer:   Lavera Guise [place]  . ALPRAZolam (XANAX) 0.5 MG tablet    Sig: Take 1 tablet po QD prn    Dispense:  30 tablet    Refill:  2    Order Specific Question:   Supervising Provider    Answer:   Lavera Guise [id number]    Time spent: 40 Minutes    Dr Lavera Guise Internal medicine

## 2020-03-28 DIAGNOSIS — M170 Bilateral primary osteoarthritis of knee: Secondary | ICD-10-CM | POA: Diagnosis not present

## 2020-03-30 DIAGNOSIS — G5601 Carpal tunnel syndrome, right upper limb: Secondary | ICD-10-CM | POA: Diagnosis not present

## 2020-04-18 ENCOUNTER — Telehealth: Payer: Self-pay

## 2020-04-18 NOTE — Telephone Encounter (Signed)
Confirmed and screened for 04-20-20 ov.

## 2020-04-20 ENCOUNTER — Other Ambulatory Visit: Payer: Self-pay

## 2020-04-20 ENCOUNTER — Ambulatory Visit (INDEPENDENT_AMBULATORY_CARE_PROVIDER_SITE_OTHER): Payer: PPO | Admitting: Nurse Practitioner

## 2020-04-20 ENCOUNTER — Encounter: Payer: Self-pay | Admitting: Nurse Practitioner

## 2020-04-20 VITALS — BP 132/51 | HR 56 | Temp 97.1°F | Resp 16 | Ht 66.0 in | Wt 135.8 lb

## 2020-04-20 DIAGNOSIS — B3731 Acute candidiasis of vulva and vagina: Secondary | ICD-10-CM

## 2020-04-20 DIAGNOSIS — F411 Generalized anxiety disorder: Secondary | ICD-10-CM | POA: Diagnosis not present

## 2020-04-20 DIAGNOSIS — G2581 Restless legs syndrome: Secondary | ICD-10-CM | POA: Diagnosis not present

## 2020-04-20 DIAGNOSIS — I10 Essential (primary) hypertension: Secondary | ICD-10-CM

## 2020-04-20 DIAGNOSIS — N390 Urinary tract infection, site not specified: Secondary | ICD-10-CM

## 2020-04-20 DIAGNOSIS — B373 Candidiasis of vulva and vagina: Secondary | ICD-10-CM | POA: Diagnosis not present

## 2020-04-20 DIAGNOSIS — M5417 Radiculopathy, lumbosacral region: Secondary | ICD-10-CM

## 2020-04-20 DIAGNOSIS — R300 Dysuria: Secondary | ICD-10-CM

## 2020-04-20 DIAGNOSIS — Z79899 Other long term (current) drug therapy: Secondary | ICD-10-CM | POA: Diagnosis not present

## 2020-04-20 LAB — POCT URINALYSIS DIPSTICK
Bilirubin, UA: NEGATIVE
Blood, UA: NEGATIVE
Glucose, UA: NEGATIVE
Ketones, UA: NEGATIVE
Nitrite, UA: NEGATIVE
Protein, UA: NEGATIVE
Spec Grav, UA: 1.015 (ref 1.010–1.025)
Urobilinogen, UA: 0.2 E.U./dL
pH, UA: 5 (ref 5.0–8.0)

## 2020-04-20 LAB — POCT URINE DRUG SCREEN
POC Amphetamine UR: NOT DETECTED
POC BENZODIAZEPINES UR: POSITIVE — AB
POC Barbiturate UR: NOT DETECTED
POC Cocaine UR: NOT DETECTED
POC Ecstasy UR: NOT DETECTED
POC Marijuana UR: NOT DETECTED
POC Methadone UR: NOT DETECTED
POC Methamphetamine UR: NOT DETECTED
POC Opiate Ur: NOT DETECTED
POC Oxycodone UR: NOT DETECTED
POC PHENCYCLIDINE UR: NOT DETECTED
POC TRICYCLICS UR: NOT DETECTED

## 2020-04-20 MED ORDER — SPIRONOLACTONE 25 MG PO TABS: 25.0000 mg | ORAL_TABLET | Freq: Every day | ORAL | 3 refills | Status: DC

## 2020-04-20 MED ORDER — ALPRAZOLAM 0.5 MG PO TABS: ORAL_TABLET | ORAL | 2 refills | Status: DC

## 2020-04-20 MED ORDER — FLUCONAZOLE 150 MG PO TABS: ORAL_TABLET | ORAL | 1 refills | Status: DC

## 2020-04-20 MED ORDER — GABAPENTIN 100 MG PO CAPS: ORAL_CAPSULE | ORAL | 3 refills | Status: DC

## 2020-04-20 MED ORDER — ROPINIROLE HCL 1 MG PO TABS: ORAL_TABLET | ORAL | 3 refills | Status: DC

## 2020-04-20 MED ORDER — NITROFURANTOIN MONOHYD MACRO 100 MG PO CAPS: 100.0000 mg | ORAL_CAPSULE | Freq: Two times a day (BID) | ORAL | 0 refills | Status: DC

## 2020-04-20 MED ORDER — BENAZEPRIL HCL 20 MG PO TABS: 20.0000 mg | ORAL_TABLET | Freq: Every day | ORAL | 0 refills | Status: DC

## 2020-04-20 MED ORDER — DICLOFENAC SODIUM 1 % EX GEL: 4.0000 g | Freq: Four times a day (QID) | CUTANEOUS | 2 refills | Status: DC

## 2020-04-20 NOTE — Progress Notes (Signed)
[facility] [place], [place] [address]  Internal MEDICINE  Office Visit Note  Patient Name: Sandra Davidson  [id number]  [id number]  Date of Service: 04/26/2020  Chief Complaint  Patient presents with  . Hypertension  . Gastroesophageal Reflux  . Arthritis  . Depression  . Anxiety  . Medication Management    has been using diclofenac for feet and it works and would like rx, puts on 4 x daily     The patient is here for routine follow up. She is having neuropathy in her left leg a bit worse recently. She states that she takes gabapentin 100mg  at night. Unwilling to increase to two at night without discussing with me first. She also takes ropinirole to help with restless legs.  She has been having urinary frequency with pressure in the bladder for past several days. She has been getting up several times each night to use the bathroom.  She states that using diclofenac topically on her feet has helped with some of her neuropathy. She would like to get a new prescription for this today.       Current Medication: Outpatient Encounter Medications as of 04/20/2020  Medication Sig  . etodolac (LODINE) 400 MG tablet Take 1 tablet (400 mg total) by mouth daily.  Marland Kitchen ibuprofen (ADVIL) 600 MG tablet Take 1 tablet (600 mg total) by mouth every 8 (eight) hours as needed.  . meclizine (ANTIVERT) 25 MG tablet Take 1 tablet (25 mg total) by mouth 3 (three) times daily as needed for dizziness.  . nitrofurantoin (MACRODANTIN) 100 MG capsule Take 100 mg by mouth daily.  . [DISCONTINUED] ALPRAZolam (XANAX) 0.5 MG tablet Take 1 tablet po QD prn  . [DISCONTINUED] benazepril (LOTENSIN) 20 MG tablet Take 1 tablet (20 mg total) by mouth daily.  . [DISCONTINUED] gabapentin (NEURONTIN) 100 MG capsule Take 1 to 2 capsules po QPM for RLS  . [DISCONTINUED] rOPINIRole (REQUIP) 1 MG tablet Take 1 to 2 tablets po QHS prn restless legs .  . [DISCONTINUED] spironolactone (ALDACTONE) 25 MG  tablet Take 1 tablet (25 mg total) by mouth daily.  Marland Kitchen ALPRAZolam (XANAX) 0.5 MG tablet Take 1 tablet po QD prn  . benazepril (LOTENSIN) 20 MG tablet Take 1 tablet (20 mg total) by mouth daily.  . diclofenac Sodium (VOLTAREN) 1 % GEL Apply 4 g topically 4 (four) times daily.  . fluconazole (DIFLUCAN) 150 MG tablet Take one tablet po every other day for total of 5 doses as needed .  . gabapentin (NEURONTIN) 100 MG capsule Take 1 to 2 capsules po QPM for RLS  . nitrofurantoin, macrocrystal-monohydrate, (MACROBID) 100 MG capsule Take 1 capsule (100 mg total) by mouth 2 (two) times daily.  Marland Kitchen rOPINIRole (REQUIP) 1 MG tablet Take 1 to 2 tablets po QHS prn restless legs .  . spironolactone (ALDACTONE) 25 MG tablet Take 1 tablet (25 mg total) by mouth daily.   No facility-administered encounter medications on file as of 04/20/2020.    Surgical History: Past Surgical History:  Procedure Laterality Date  . BACK SURGERY    . MOHS SURGERY    . NECK SURGERY    . TONSILLECTOMY      Medical History: Past Medical History:  Diagnosis Date  . Allergy   . Anxiety   . Arthritis   . Cancer ([place])    skin  . Depression   . GERD (gastroesophageal reflux disease)   . Hypertension   . Migraines  Family History: Family History  Problem Relation Age of Onset  . Congestive Heart Failure Mother   . COPD Mother   . Emphysema Mother   . Hypertension Mother   . Kidney disease Mother   . Cancer Father   . Kidney disease Father   . Bladder Cancer Neg Hx     Social History   Socioeconomic History  . Marital status: Divorced    Spouse name: Not on file  . Number of children: Not on file  . Years of education: Not on file  . Highest education level: Not on file  Occupational History  . Not on file  Tobacco Use  . Smoking status: Former Smoker    Types: Cigarettes  . Smokeless tobacco: Never Used  Substance and Sexual Activity  . Alcohol use: No  . Drug use: No  . Sexual activity: Not  Currently  Other Topics Concern  . Not on file  Social History Narrative  . Not on file   Social Determinants of Health   Financial Resource Strain:   . Difficulty of Paying Living Expenses:   Food Insecurity:   . Worried About Charity fundraiser in the Last Year:   . Arboriculturist in the Last Year:   Transportation Needs:   . Film/video editor (Medical):   Marland Kitchen Lack of Transportation (Non-Medical):   Physical Activity:   . Days of Exercise per Week:   . Minutes of Exercise per Session:   Stress:   . Feeling of Stress :   Social Connections:   . Frequency of Communication with Friends and Family:   . Frequency of Social Gatherings with Friends and Family:   . Attends Religious Services:   . Active Member of Clubs or Organizations:   . Attends Archivist Meetings:   Marland Kitchen Marital Status:   Intimate Partner Violence:   . Fear of Current or Ex-Partner:   . Emotionally Abused:   Marland Kitchen Physically Abused:   . Sexually Abused:       Review of Systems  Constitutional: Negative for activity change, chills, fatigue and unexpected weight change.  HENT: Negative for congestion, rhinorrhea, sneezing and sore throat.   Respiratory: Negative for cough, chest tightness, shortness of breath and wheezing.   Cardiovascular: Positive for leg swelling. Negative for chest pain and palpitations.       Bilateral lower leg pain.   Gastrointestinal: Negative for abdominal pain, constipation, diarrhea, nausea and vomiting.  Endocrine: Negative for cold intolerance, heat intolerance, polydipsia and polyuria.  Genitourinary: Positive for dysuria, frequency and urgency.  Musculoskeletal: Positive for arthralgias and myalgias. Negative for back pain, joint swelling and neck pain.       Bilateral knee pain   Skin: Negative for rash.  Allergic/Immunologic: Negative for environmental allergies.  Neurological: Negative for dizziness, tremors, numbness and headaches.  Hematological: Negative  for adenopathy. Does not bruise/bleed easily.  Psychiatric/Behavioral: Negative for behavioral problems and sleep disturbance. The patient is nervous/anxious.     Today's Vitals   04/20/20 1053  BP: (!) 132/51  Pulse: (!) 56  Resp: 16  Temp: (!) 97.1 F (36.2 C)  SpO2: 98%  Weight: 135 lb 12.8 oz (61.6 kg)  Height: 5\' 6"  (1.676 m)   Body mass index is 21.92 kg/m.  Physical Exam Vitals and nursing note reviewed.  Constitutional:      General: She is not in acute distress.    Appearance: Normal appearance. She is well-developed. She is not  diaphoretic.  HENT:     Head: Normocephalic and atraumatic.     Nose: Nose normal.     Mouth/Throat:     Pharynx: No oropharyngeal exudate.  Eyes:     Conjunctiva/sclera: Conjunctivae normal.     Pupils: Pupils are equal, round, and reactive to light.  Neck:     Thyroid: No thyromegaly.     Vascular: No carotid bruit or JVD.     Trachea: No tracheal deviation.  Cardiovascular:     Rate and Rhythm: Normal rate and regular rhythm.     Pulses: Normal pulses.     Heart sounds: Normal heart sounds. No murmur. No friction rub. No gallop.      Comments: Bilateral leg pain, lower extremities feel cool to the touch. Pulses are palpable, bilaterally. She has good capillary refill. No edema is present in either foot or lower leg.  Pulmonary:     Effort: Pulmonary effort is normal. No respiratory distress.     Breath sounds: Normal breath sounds. No wheezing or rales.  Chest:     Chest wall: No tenderness.  Abdominal:     General: Bowel sounds are normal.     Palpations: Abdomen is soft.     Tenderness: There is no abdominal tenderness.  Genitourinary:    Comments: Urine sample positive for moderate WBC Musculoskeletal:        General: Normal range of motion.     Cervical back: Normal range of motion and neck supple.     Comments:  Bilateral knee pain/tenderness. ROM and strength is intact at this time.   Lymphadenopathy:     Cervical: No  cervical adenopathy.  Skin:    General: Skin is warm and dry.     Capillary Refill: Capillary refill takes 2 to 3 seconds.     Comments: Skin of bilateral lower extremities is cool to touch.   Neurological:     General: No focal deficit present.     Mental Status: She is alert and oriented to person, place, and time.     Cranial Nerves: No cranial nerve deficit.  Psychiatric:        Behavior: Behavior normal.        Thought Content: Thought content normal.        Judgment: Judgment normal.    Assessment/Plan: 1. Urinary tract infection without hematuria, site unspecified Start macrobid 100mg  bid for 10 days. Send urine for culture and sensitivity and adjust antibiotics as indicated  - CULTURE, URINE COMPREHENSIVE - nitrofurantoin, macrocrystal-monohydrate, (MACROBID) 100 MG capsule; Take 1 capsule (100 mg total) by mouth 2 (two) times daily.  Dispense: 20 capsule; Refill: 0  2. Dysuria Treat for infection.  - POCT Urinalysis Dipstick  3. Vaginal yeast infection Diflucan 150mg  tablets may be taken every other day for 5 doses if yeast infection develops.  - fluconazole (DIFLUCAN) 150 MG tablet; Take one tablet po every other day for total of 5 doses as needed .  Dispense: 5 tablet; Refill: 1  4. Essential hypertension Stable. Continue bp medication as prescribed.  - benazepril (LOTENSIN) 20 MG tablet; Take 1 tablet (20 mg total) by mouth daily.  Dispense: 90 tablet; Refill: 0 - spironolactone (ALDACTONE) 25 MG tablet; Take 1 tablet (25 mg total) by mouth daily.  Dispense: 90 tablet; Refill: 3  5. Generalized anxiety disorder May continue to take alprazolam 0.5mg  once daily if needed for acute anxiety. New prescription sent to her pharmacy today.  - ALPRAZolam (XANAX) 0.5 MG tablet;  Take 1 tablet po QD prn  Dispense: 30 tablet; Refill: 2  6. Lumbosacral radiculopathy due to intervertebral disc disorder Recommended she take gabapentin 200mg  every evening to help with neuropathy in  legs. May apply diclofenac gel to legs up to four times dail if needed for pain.  - gabapentin (NEURONTIN) 100 MG capsule; Take 1 to 2 capsules po QPM for RLS  Dispense: 60 capsule; Refill: 3 - diclofenac Sodium (VOLTAREN) 1 % GEL; Apply 4 g topically 4 (four) times daily.  Dispense: 100 g; Refill: 2  7. Restless leg syndrome, controlled Stable. Continue requip as prescribed  - rOPINIRole (REQUIP) 1 MG tablet; Take 1 to 2 tablets po QHS prn restless legs .  Dispense: 60 tablet; Refill: 3  8. Encounter for long-term (current) use of medications - POCT Urine Drug Screen appropriately positive for [facility] only.   General Counseling: Ralph verbalizes understanding of the findings of todays visit and agrees with plan of treatment. I have discussed any further diagnostic evaluation that may be needed or ordered today. We also reviewed her medications today. she has been encouraged to call the office with any questions or concerns that should arise related to todays visit.  This patient was seen by Leretha Pol FNP Collaboration with Dr Lavera Guise as a part of collaborative care agreement  Orders Placed This Encounter  Procedures  . CULTURE, URINE COMPREHENSIVE  . POCT Urine Drug Screen  . POCT Urinalysis Dipstick    Meds ordered this encounter  Medications  . nitrofurantoin, macrocrystal-monohydrate, (MACROBID) 100 MG capsule    Sig: Take 1 capsule (100 mg total) by mouth 2 (two) times daily.    Dispense:  20 capsule    Refill:  0    Order Specific Question:   Supervising Provider    Answer:   Lavera Guise [id number]  . fluconazole (DIFLUCAN) 150 MG tablet    Sig: Take one tablet po every other day for total of 5 doses as needed .    Dispense:  5 tablet    Refill:  1    Order Specific Question:   Supervising Provider    Answer:   Lavera Guise [id number]  . ALPRAZolam (XANAX) 0.5 MG tablet    Sig: Take 1 tablet po QD prn    Dispense:  30 tablet    Refill:  2    Order Specific Question:    Supervising Provider    Answer:   Lavera Guise [place]  . benazepril (LOTENSIN) 20 MG tablet    Sig: Take 1 tablet (20 mg total) by mouth daily.    Dispense:  90 tablet    Refill:  0    Patient instructed to take 1/2 benazepril daily. Continue spironolactone as prescribed    Order Specific Question:   Supervising Provider    Answer:   Lavera Guise [id number]  . gabapentin (NEURONTIN) 100 MG capsule    Sig: Take 1 to 2 capsules po QPM for RLS    Dispense:  60 capsule    Refill:  3    Order Specific Question:   Supervising Provider    Answer:   Lavera Guise [place]  . rOPINIRole (REQUIP) 1 MG tablet    Sig: Take 1 to 2 tablets po QHS prn restless legs .    Dispense:  60 tablet    Refill:  3    Please note increased dose    Order Specific Question:   Supervising Provider  Answer:   Lavera Guise [place]  . spironolactone (ALDACTONE) 25 MG tablet    Sig: Take 1 tablet (25 mg total) by mouth daily.    Dispense:  90 tablet    Refill:  3    Order Specific Question:   Supervising Provider    Answer:   Lavera Guise [id number]  . diclofenac Sodium (VOLTAREN) 1 % GEL    Sig: Apply 4 g topically 4 (four) times daily.    Dispense:  100 g    Refill:  2    Order Specific Question:   Supervising Provider    Answer:   Lavera Guise [id number]    Total time spent: 30 Minutes   Time spent includes review of chart, medications, test results, and follow up plan with the patient.      Dr Lavera Guise Internal medicine

## 2020-04-23 LAB — CULTURE, URINE COMPREHENSIVE

## 2020-04-24 NOTE — Progress Notes (Signed)
Patient started on BID dosing macrobid at visit.

## 2020-04-26 DIAGNOSIS — B3731 Acute candidiasis of vulva and vagina: Secondary | ICD-10-CM | POA: Insufficient documentation

## 2020-04-28 ENCOUNTER — Telehealth: Payer: Self-pay

## 2020-04-28 NOTE — Telephone Encounter (Signed)
Completed record request and handed requested records to the patient.

## 2020-05-02 DIAGNOSIS — M5412 Radiculopathy, cervical region: Secondary | ICD-10-CM | POA: Diagnosis not present

## 2020-05-02 DIAGNOSIS — M9901 Segmental and somatic dysfunction of cervical region: Secondary | ICD-10-CM | POA: Diagnosis not present

## 2020-05-02 DIAGNOSIS — M4306 Spondylolysis, lumbar region: Secondary | ICD-10-CM | POA: Diagnosis not present

## 2020-05-02 DIAGNOSIS — M9903 Segmental and somatic dysfunction of lumbar region: Secondary | ICD-10-CM | POA: Diagnosis not present

## 2020-05-03 DIAGNOSIS — M4306 Spondylolysis, lumbar region: Secondary | ICD-10-CM | POA: Diagnosis not present

## 2020-05-03 DIAGNOSIS — M5412 Radiculopathy, cervical region: Secondary | ICD-10-CM | POA: Diagnosis not present

## 2020-05-03 DIAGNOSIS — M9901 Segmental and somatic dysfunction of cervical region: Secondary | ICD-10-CM | POA: Diagnosis not present

## 2020-05-03 DIAGNOSIS — M9903 Segmental and somatic dysfunction of lumbar region: Secondary | ICD-10-CM | POA: Diagnosis not present

## 2020-05-05 DIAGNOSIS — M4306 Spondylolysis, lumbar region: Secondary | ICD-10-CM | POA: Diagnosis not present

## 2020-05-05 DIAGNOSIS — M9901 Segmental and somatic dysfunction of cervical region: Secondary | ICD-10-CM | POA: Diagnosis not present

## 2020-05-05 DIAGNOSIS — M5412 Radiculopathy, cervical region: Secondary | ICD-10-CM | POA: Diagnosis not present

## 2020-05-05 DIAGNOSIS — M9903 Segmental and somatic dysfunction of lumbar region: Secondary | ICD-10-CM | POA: Diagnosis not present

## 2020-05-09 DIAGNOSIS — M5412 Radiculopathy, cervical region: Secondary | ICD-10-CM | POA: Diagnosis not present

## 2020-05-09 DIAGNOSIS — M9903 Segmental and somatic dysfunction of lumbar region: Secondary | ICD-10-CM | POA: Diagnosis not present

## 2020-05-09 DIAGNOSIS — M4306 Spondylolysis, lumbar region: Secondary | ICD-10-CM | POA: Diagnosis not present

## 2020-05-09 DIAGNOSIS — M9901 Segmental and somatic dysfunction of cervical region: Secondary | ICD-10-CM | POA: Diagnosis not present

## 2020-05-10 DIAGNOSIS — M9903 Segmental and somatic dysfunction of lumbar region: Secondary | ICD-10-CM | POA: Diagnosis not present

## 2020-05-10 DIAGNOSIS — M5412 Radiculopathy, cervical region: Secondary | ICD-10-CM | POA: Diagnosis not present

## 2020-05-10 DIAGNOSIS — M9901 Segmental and somatic dysfunction of cervical region: Secondary | ICD-10-CM | POA: Diagnosis not present

## 2020-05-10 DIAGNOSIS — M4306 Spondylolysis, lumbar region: Secondary | ICD-10-CM | POA: Diagnosis not present

## 2020-05-12 DIAGNOSIS — M4306 Spondylolysis, lumbar region: Secondary | ICD-10-CM | POA: Diagnosis not present

## 2020-05-12 DIAGNOSIS — M5412 Radiculopathy, cervical region: Secondary | ICD-10-CM | POA: Diagnosis not present

## 2020-05-12 DIAGNOSIS — M9903 Segmental and somatic dysfunction of lumbar region: Secondary | ICD-10-CM | POA: Diagnosis not present

## 2020-05-12 DIAGNOSIS — M9901 Segmental and somatic dysfunction of cervical region: Secondary | ICD-10-CM | POA: Diagnosis not present

## 2020-05-15 DIAGNOSIS — M4306 Spondylolysis, lumbar region: Secondary | ICD-10-CM | POA: Diagnosis not present

## 2020-05-15 DIAGNOSIS — M5412 Radiculopathy, cervical region: Secondary | ICD-10-CM | POA: Diagnosis not present

## 2020-05-15 DIAGNOSIS — M9903 Segmental and somatic dysfunction of lumbar region: Secondary | ICD-10-CM | POA: Diagnosis not present

## 2020-05-15 DIAGNOSIS — M9901 Segmental and somatic dysfunction of cervical region: Secondary | ICD-10-CM | POA: Diagnosis not present

## 2020-05-17 DIAGNOSIS — M5412 Radiculopathy, cervical region: Secondary | ICD-10-CM | POA: Diagnosis not present

## 2020-05-17 DIAGNOSIS — M9903 Segmental and somatic dysfunction of lumbar region: Secondary | ICD-10-CM | POA: Diagnosis not present

## 2020-05-17 DIAGNOSIS — M9901 Segmental and somatic dysfunction of cervical region: Secondary | ICD-10-CM | POA: Diagnosis not present

## 2020-05-17 DIAGNOSIS — M4306 Spondylolysis, lumbar region: Secondary | ICD-10-CM | POA: Diagnosis not present

## 2020-05-19 DIAGNOSIS — M9901 Segmental and somatic dysfunction of cervical region: Secondary | ICD-10-CM | POA: Diagnosis not present

## 2020-05-19 DIAGNOSIS — M4306 Spondylolysis, lumbar region: Secondary | ICD-10-CM | POA: Diagnosis not present

## 2020-05-19 DIAGNOSIS — M5412 Radiculopathy, cervical region: Secondary | ICD-10-CM | POA: Diagnosis not present

## 2020-05-19 DIAGNOSIS — M9903 Segmental and somatic dysfunction of lumbar region: Secondary | ICD-10-CM | POA: Diagnosis not present

## 2020-05-22 DIAGNOSIS — M9903 Segmental and somatic dysfunction of lumbar region: Secondary | ICD-10-CM | POA: Diagnosis not present

## 2020-05-22 DIAGNOSIS — M9901 Segmental and somatic dysfunction of cervical region: Secondary | ICD-10-CM | POA: Diagnosis not present

## 2020-05-22 DIAGNOSIS — M5412 Radiculopathy, cervical region: Secondary | ICD-10-CM | POA: Diagnosis not present

## 2020-05-22 DIAGNOSIS — M4306 Spondylolysis, lumbar region: Secondary | ICD-10-CM | POA: Diagnosis not present

## 2020-05-26 DIAGNOSIS — M5412 Radiculopathy, cervical region: Secondary | ICD-10-CM | POA: Diagnosis not present

## 2020-05-26 DIAGNOSIS — M9903 Segmental and somatic dysfunction of lumbar region: Secondary | ICD-10-CM | POA: Diagnosis not present

## 2020-05-26 DIAGNOSIS — M9901 Segmental and somatic dysfunction of cervical region: Secondary | ICD-10-CM | POA: Diagnosis not present

## 2020-05-26 DIAGNOSIS — M4306 Spondylolysis, lumbar region: Secondary | ICD-10-CM | POA: Diagnosis not present

## 2020-05-29 DIAGNOSIS — M9903 Segmental and somatic dysfunction of lumbar region: Secondary | ICD-10-CM | POA: Diagnosis not present

## 2020-05-29 DIAGNOSIS — M4306 Spondylolysis, lumbar region: Secondary | ICD-10-CM | POA: Diagnosis not present

## 2020-05-29 DIAGNOSIS — M5412 Radiculopathy, cervical region: Secondary | ICD-10-CM | POA: Diagnosis not present

## 2020-05-29 DIAGNOSIS — M9901 Segmental and somatic dysfunction of cervical region: Secondary | ICD-10-CM | POA: Diagnosis not present

## 2020-05-31 DIAGNOSIS — M5412 Radiculopathy, cervical region: Secondary | ICD-10-CM | POA: Diagnosis not present

## 2020-05-31 DIAGNOSIS — M9901 Segmental and somatic dysfunction of cervical region: Secondary | ICD-10-CM | POA: Diagnosis not present

## 2020-05-31 DIAGNOSIS — M9903 Segmental and somatic dysfunction of lumbar region: Secondary | ICD-10-CM | POA: Diagnosis not present

## 2020-05-31 DIAGNOSIS — M4306 Spondylolysis, lumbar region: Secondary | ICD-10-CM | POA: Diagnosis not present

## 2020-06-04 IMAGING — CR DG SHOULDER 2+V*R*
3 series · 4 of 4 positions shown · non-contrast
Comparison: None.

CLINICAL DATA: Fall this morning. Right shoulder pain and
tenderness. Initial encounter.

EXAM:
RIGHT SHOULDER - 2+ VIEW

[shoulder ap neutral]
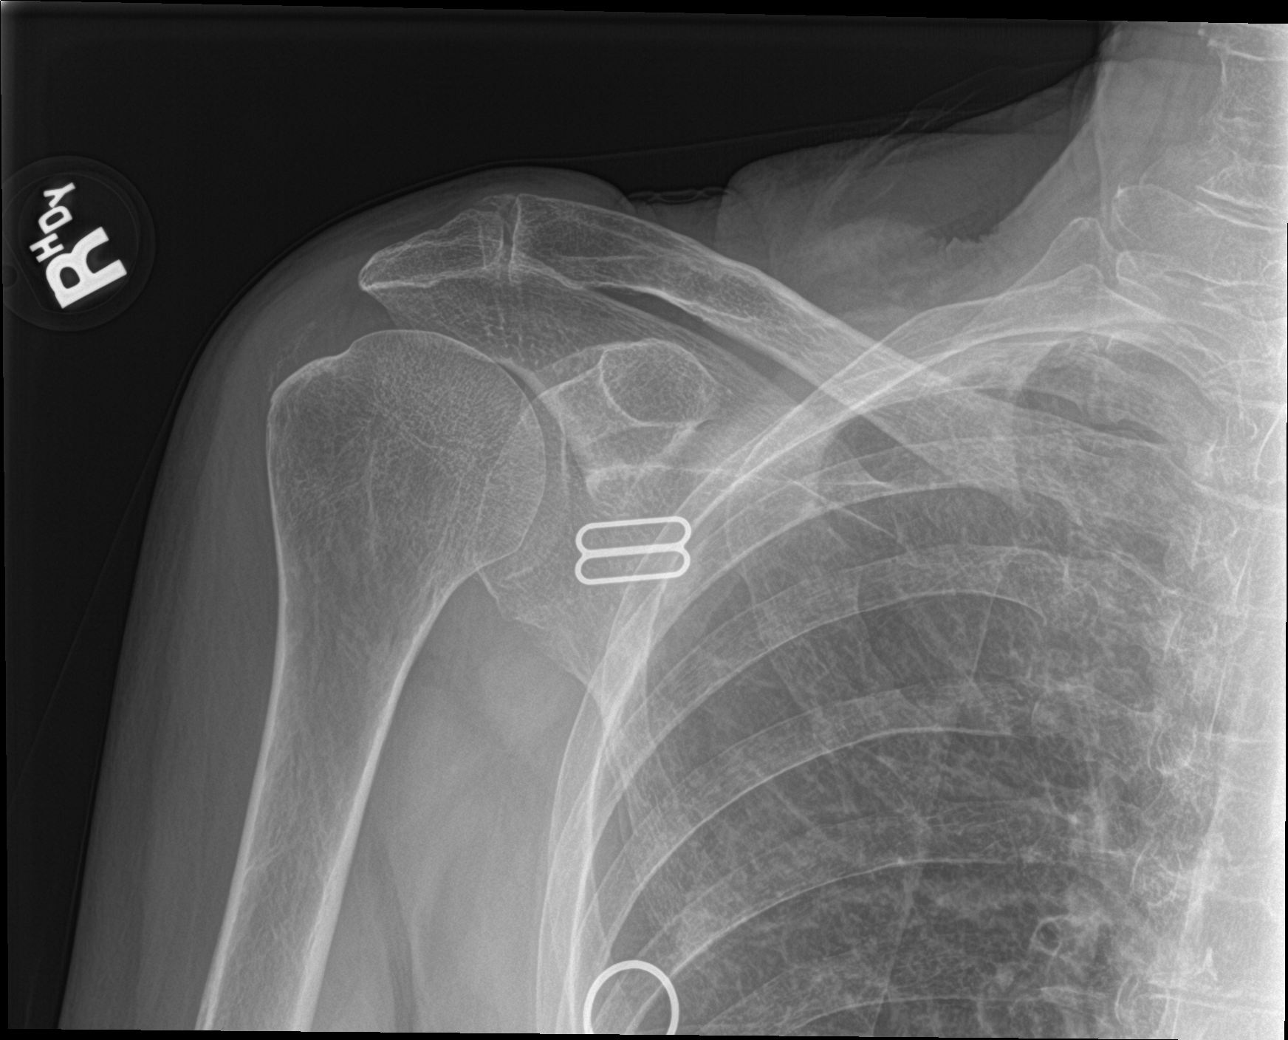

[Series 2: shoulder grashey · 0.14mm/px · 2 of 2 slices shown]
[im 1/2]
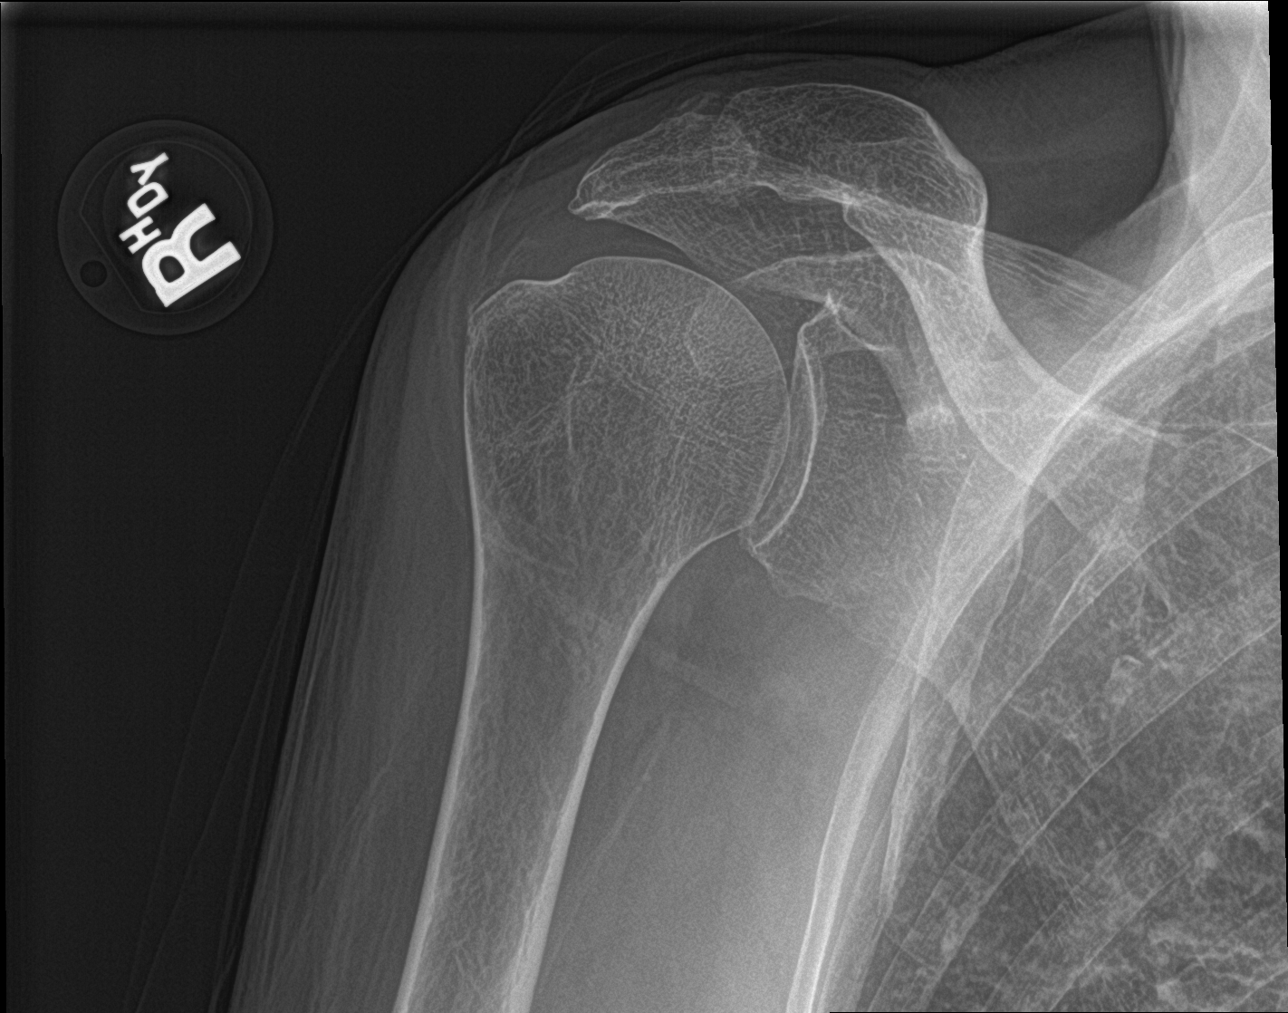
[im 2/2]
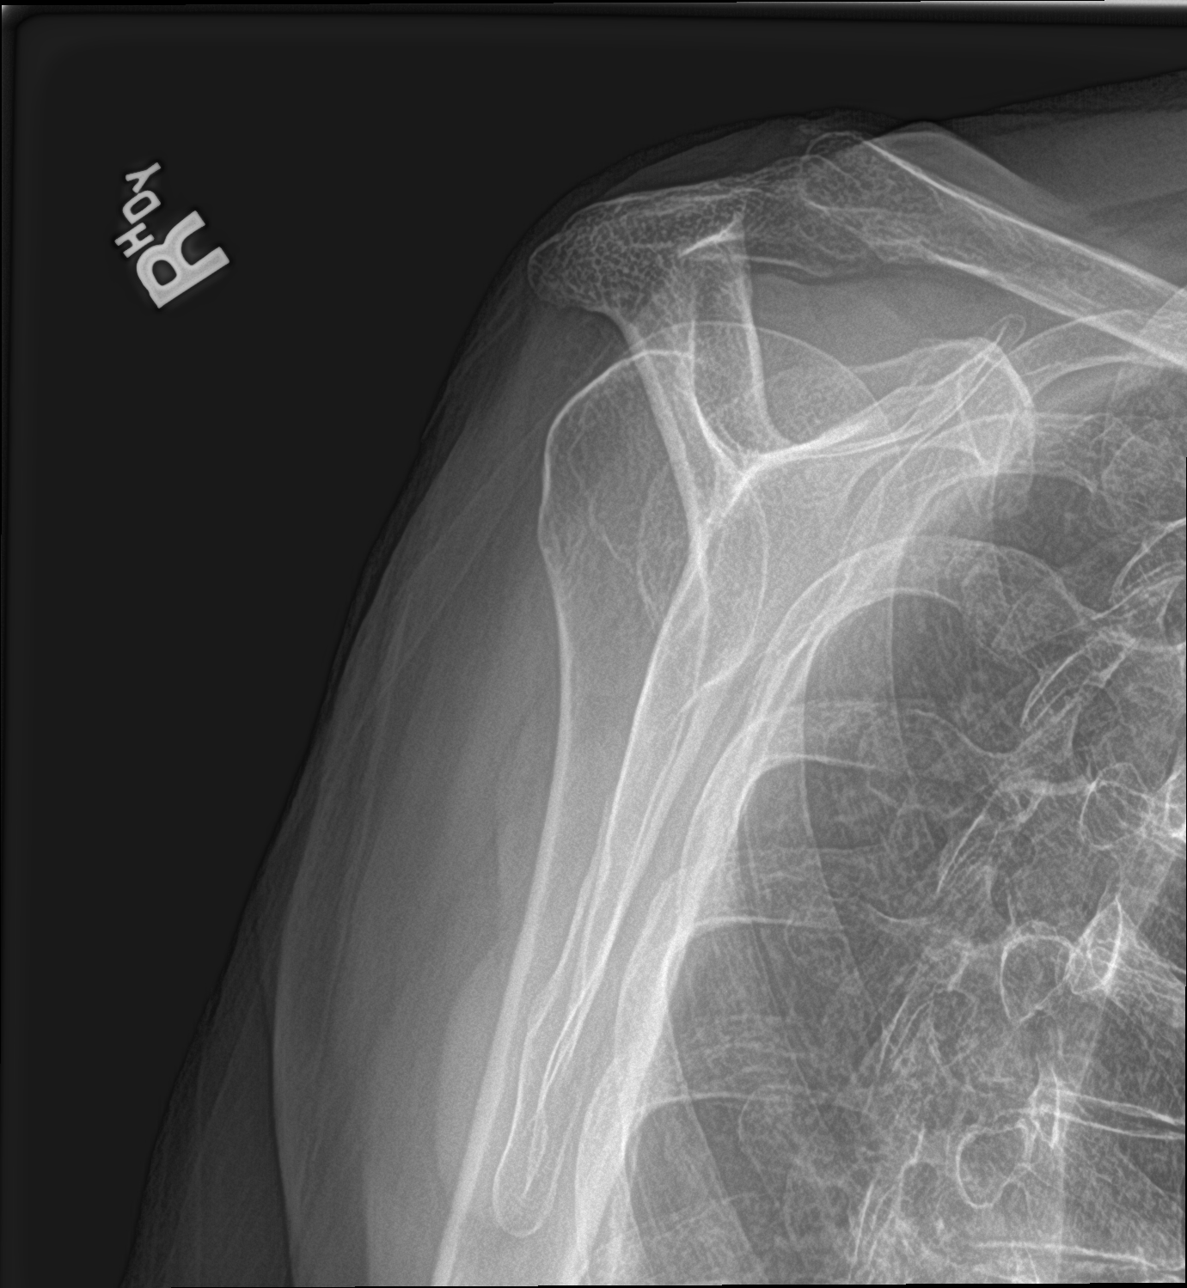

[shoulder axillary]
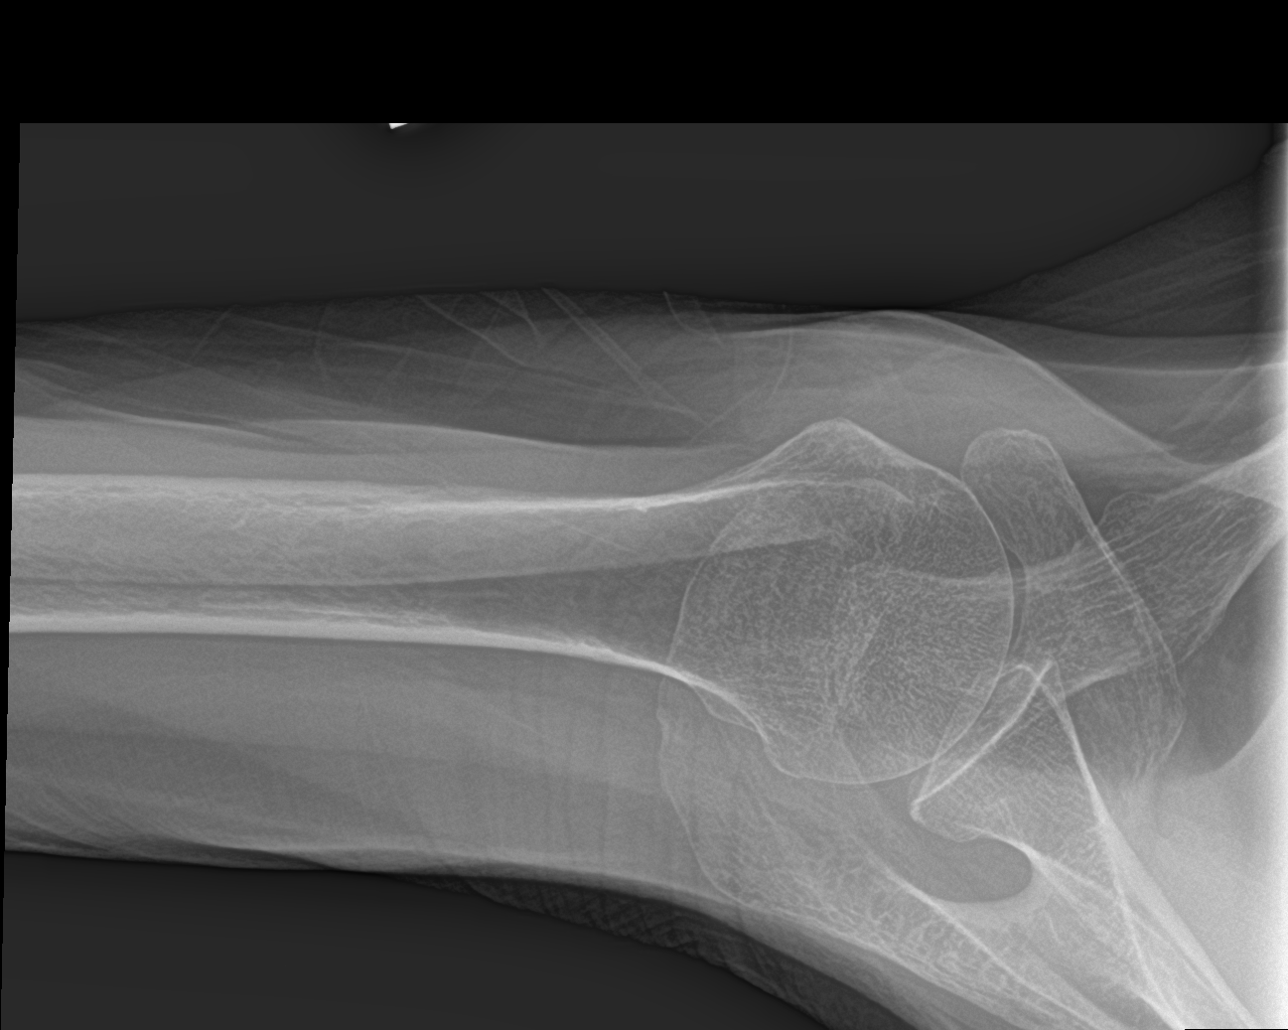

[4 of 4 positions shown; findings below may reference images not displayed]

FINDINGS: There is no evidence of fracture or dislocation. Mild
acromioclavicular degenerative changes are seen. Generalized
osteopenia noted. No focal bone lesions identified.
IMPRESSION: No acute findings.

Mild acromioclavicular degenerative changes.

## 2020-06-05 DIAGNOSIS — M4306 Spondylolysis, lumbar region: Secondary | ICD-10-CM | POA: Diagnosis not present

## 2020-06-05 DIAGNOSIS — M9901 Segmental and somatic dysfunction of cervical region: Secondary | ICD-10-CM | POA: Diagnosis not present

## 2020-06-05 DIAGNOSIS — M5412 Radiculopathy, cervical region: Secondary | ICD-10-CM | POA: Diagnosis not present

## 2020-06-05 DIAGNOSIS — M9903 Segmental and somatic dysfunction of lumbar region: Secondary | ICD-10-CM | POA: Diagnosis not present

## 2020-06-07 DIAGNOSIS — M5412 Radiculopathy, cervical region: Secondary | ICD-10-CM | POA: Diagnosis not present

## 2020-06-07 DIAGNOSIS — M9901 Segmental and somatic dysfunction of cervical region: Secondary | ICD-10-CM | POA: Diagnosis not present

## 2020-06-07 DIAGNOSIS — M9903 Segmental and somatic dysfunction of lumbar region: Secondary | ICD-10-CM | POA: Diagnosis not present

## 2020-06-07 DIAGNOSIS — M4306 Spondylolysis, lumbar region: Secondary | ICD-10-CM | POA: Diagnosis not present

## 2020-06-11 DIAGNOSIS — S51812A Laceration without foreign body of left forearm, initial encounter: Secondary | ICD-10-CM | POA: Diagnosis not present

## 2020-06-13 DIAGNOSIS — M9903 Segmental and somatic dysfunction of lumbar region: Secondary | ICD-10-CM | POA: Diagnosis not present

## 2020-06-13 DIAGNOSIS — M9901 Segmental and somatic dysfunction of cervical region: Secondary | ICD-10-CM | POA: Diagnosis not present

## 2020-06-13 DIAGNOSIS — M4306 Spondylolysis, lumbar region: Secondary | ICD-10-CM | POA: Diagnosis not present

## 2020-06-13 DIAGNOSIS — M5412 Radiculopathy, cervical region: Secondary | ICD-10-CM | POA: Diagnosis not present

## 2020-06-14 DIAGNOSIS — M9901 Segmental and somatic dysfunction of cervical region: Secondary | ICD-10-CM | POA: Diagnosis not present

## 2020-06-14 DIAGNOSIS — M4306 Spondylolysis, lumbar region: Secondary | ICD-10-CM | POA: Diagnosis not present

## 2020-06-14 DIAGNOSIS — M9903 Segmental and somatic dysfunction of lumbar region: Secondary | ICD-10-CM | POA: Diagnosis not present

## 2020-06-14 DIAGNOSIS — M5412 Radiculopathy, cervical region: Secondary | ICD-10-CM | POA: Diagnosis not present

## 2020-06-19 DIAGNOSIS — M4306 Spondylolysis, lumbar region: Secondary | ICD-10-CM | POA: Diagnosis not present

## 2020-06-19 DIAGNOSIS — M5412 Radiculopathy, cervical region: Secondary | ICD-10-CM | POA: Diagnosis not present

## 2020-06-19 DIAGNOSIS — M9901 Segmental and somatic dysfunction of cervical region: Secondary | ICD-10-CM | POA: Diagnosis not present

## 2020-06-19 DIAGNOSIS — M9903 Segmental and somatic dysfunction of lumbar region: Secondary | ICD-10-CM | POA: Diagnosis not present

## 2020-06-21 DIAGNOSIS — M9901 Segmental and somatic dysfunction of cervical region: Secondary | ICD-10-CM | POA: Diagnosis not present

## 2020-06-21 DIAGNOSIS — M9903 Segmental and somatic dysfunction of lumbar region: Secondary | ICD-10-CM | POA: Diagnosis not present

## 2020-06-21 DIAGNOSIS — M4306 Spondylolysis, lumbar region: Secondary | ICD-10-CM | POA: Diagnosis not present

## 2020-06-21 DIAGNOSIS — M5412 Radiculopathy, cervical region: Secondary | ICD-10-CM | POA: Diagnosis not present

## 2020-06-27 DIAGNOSIS — M5412 Radiculopathy, cervical region: Secondary | ICD-10-CM | POA: Diagnosis not present

## 2020-06-27 DIAGNOSIS — M9903 Segmental and somatic dysfunction of lumbar region: Secondary | ICD-10-CM | POA: Diagnosis not present

## 2020-06-27 DIAGNOSIS — M4306 Spondylolysis, lumbar region: Secondary | ICD-10-CM | POA: Diagnosis not present

## 2020-06-27 DIAGNOSIS — M9901 Segmental and somatic dysfunction of cervical region: Secondary | ICD-10-CM | POA: Diagnosis not present

## 2020-06-30 ENCOUNTER — Telehealth: Payer: Self-pay

## 2020-06-30 NOTE — Telephone Encounter (Signed)
Confirmed and screened for 07-04-20 ov. °

## 2020-07-04 ENCOUNTER — Ambulatory Visit: Payer: PPO | Admitting: Nurse Practitioner

## 2020-07-04 DIAGNOSIS — M5412 Radiculopathy, cervical region: Secondary | ICD-10-CM | POA: Diagnosis not present

## 2020-07-04 DIAGNOSIS — M4306 Spondylolysis, lumbar region: Secondary | ICD-10-CM | POA: Diagnosis not present

## 2020-07-04 DIAGNOSIS — M9903 Segmental and somatic dysfunction of lumbar region: Secondary | ICD-10-CM | POA: Diagnosis not present

## 2020-07-04 DIAGNOSIS — M9901 Segmental and somatic dysfunction of cervical region: Secondary | ICD-10-CM | POA: Diagnosis not present

## 2020-07-07 DIAGNOSIS — M9901 Segmental and somatic dysfunction of cervical region: Secondary | ICD-10-CM | POA: Diagnosis not present

## 2020-07-07 DIAGNOSIS — M5412 Radiculopathy, cervical region: Secondary | ICD-10-CM | POA: Diagnosis not present

## 2020-07-07 DIAGNOSIS — M4306 Spondylolysis, lumbar region: Secondary | ICD-10-CM | POA: Diagnosis not present

## 2020-07-07 DIAGNOSIS — M9903 Segmental and somatic dysfunction of lumbar region: Secondary | ICD-10-CM | POA: Diagnosis not present

## 2020-07-14 ENCOUNTER — Telehealth: Payer: Self-pay

## 2020-07-14 NOTE — Telephone Encounter (Signed)
Confirmed and screened for 07-18-20 ov. 

## 2020-07-18 ENCOUNTER — Ambulatory Visit (INDEPENDENT_AMBULATORY_CARE_PROVIDER_SITE_OTHER): Payer: PPO | Admitting: Nurse Practitioner

## 2020-07-18 ENCOUNTER — Other Ambulatory Visit: Payer: Self-pay

## 2020-07-18 ENCOUNTER — Encounter: Payer: Self-pay | Admitting: Nurse Practitioner

## 2020-07-18 VITALS — BP 117/52 | HR 58 | Temp 97.6°F | Resp 16 | Ht 66.0 in | Wt 134.2 lb

## 2020-07-18 DIAGNOSIS — I10 Essential (primary) hypertension: Secondary | ICD-10-CM

## 2020-07-18 DIAGNOSIS — M5417 Radiculopathy, lumbosacral region: Secondary | ICD-10-CM | POA: Diagnosis not present

## 2020-07-18 DIAGNOSIS — Z0001 Encounter for general adult medical examination with abnormal findings: Secondary | ICD-10-CM | POA: Diagnosis not present

## 2020-07-18 DIAGNOSIS — F411 Generalized anxiety disorder: Secondary | ICD-10-CM

## 2020-07-18 DIAGNOSIS — G2581 Restless legs syndrome: Secondary | ICD-10-CM | POA: Diagnosis not present

## 2020-07-18 DIAGNOSIS — M5412 Radiculopathy, cervical region: Secondary | ICD-10-CM | POA: Diagnosis not present

## 2020-07-18 DIAGNOSIS — R300 Dysuria: Secondary | ICD-10-CM | POA: Diagnosis not present

## 2020-07-18 DIAGNOSIS — M9901 Segmental and somatic dysfunction of cervical region: Secondary | ICD-10-CM | POA: Diagnosis not present

## 2020-07-18 DIAGNOSIS — M9903 Segmental and somatic dysfunction of lumbar region: Secondary | ICD-10-CM | POA: Diagnosis not present

## 2020-07-18 DIAGNOSIS — M4306 Spondylolysis, lumbar region: Secondary | ICD-10-CM | POA: Diagnosis not present

## 2020-07-18 MED ORDER — GABAPENTIN 100 MG PO CAPS: ORAL_CAPSULE | ORAL | 3 refills | Status: DC

## 2020-07-18 MED ORDER — DICLOFENAC SODIUM 1 % EX GEL: 4.0000 g | Freq: Four times a day (QID) | CUTANEOUS | 3 refills | Status: DC

## 2020-07-18 MED ORDER — NITROFURANTOIN MACROCRYSTAL 100 MG PO CAPS: 100.0000 mg | ORAL_CAPSULE | Freq: Every day | ORAL | 3 refills | Status: DC

## 2020-07-18 MED ORDER — ALPRAZOLAM 0.5 MG PO TABS: ORAL_TABLET | ORAL | 3 refills | Status: DC

## 2020-07-18 MED ORDER — ROPINIROLE HCL 1 MG PO TABS: ORAL_TABLET | ORAL | 3 refills | Status: DC

## 2020-07-18 NOTE — [place] Notes (Signed)
[facility] [place], [place] [address]  Internal MEDICINE  Office Visit Note  Patient Name: Sandra Davidson  [id number]  [id number]  Date of Service: 08/02/2020   Pt is here for routine health maintenance examination  Chief Complaint  Patient presents with  . Medicare Wellness  . Depression  . Gastroesophageal Reflux  . Hypertension  . Quality Metric Gaps    DEXA Scan     The patient is here for health maintenance exam. Today, she states that she is feeling well. She has seen chiropractor due to persistent neuropathy in her feet and pain in her knees. She has had several visits with chiropractor since she was last seen. She states that her legs and feet feel better than they have in a very long time. She states that her feet feel warm and her knees are nearly pain free. She states that she still has some neuropathy in her feet. Does still have restless legs, especially at night. She states that she is able to sit still for much longer periods of time than prior to starting visits with the chiropractor.     Current Medication: Outpatient Encounter Medications as of 07/18/2020  Medication Sig  . ALPRAZolam (XANAX) 0.5 MG tablet Take 1 tablet po QD prn  . benazepril (LOTENSIN) 20 MG tablet Take 1 tablet (20 mg total) by mouth daily.  . diclofenac Sodium (VOLTAREN) 1 % GEL Apply 4 g topically 4 (four) times daily.  Marland Kitchen etodolac (LODINE) 400 MG tablet Take 1 tablet (400 mg total) by mouth daily.  . fluconazole (DIFLUCAN) 150 MG tablet Take one tablet po every other day for total of 5 doses as needed .  . gabapentin (NEURONTIN) 100 MG capsule Take 1 to 2 capsules po QPM for RLS  . ibuprofen (ADVIL) 600 MG tablet Take 1 tablet (600 mg total) by mouth every 8 (eight) hours as needed.  . meclizine (ANTIVERT) 25 MG tablet Take 1 tablet (25 mg total) by mouth 3 (three) times daily as needed for dizziness.  . nitrofurantoin (MACRODANTIN) 100 MG capsule Take 1 capsule  (100 mg total) by mouth daily.  Marland Kitchen rOPINIRole (REQUIP) 1 MG tablet Take 1 to 2 tablets po QHS prn restless legs .  . spironolactone (ALDACTONE) 25 MG tablet Take 1 tablet (25 mg total) by mouth daily.  . [DISCONTINUED] ALPRAZolam (XANAX) 0.5 MG tablet Take 1 tablet po QD prn  . [DISCONTINUED] diclofenac Sodium (VOLTAREN) 1 % GEL Apply 4 g topically 4 (four) times daily.  . [DISCONTINUED] gabapentin (NEURONTIN) 100 MG capsule Take 1 to 2 capsules po QPM for RLS  . [DISCONTINUED] nitrofurantoin (MACRODANTIN) 100 MG capsule Take 100 mg by mouth daily.  . [DISCONTINUED] nitrofurantoin, macrocrystal-monohydrate, (MACROBID) 100 MG capsule Take 1 capsule (100 mg total) by mouth 2 (two) times daily.  . [DISCONTINUED] rOPINIRole (REQUIP) 1 MG tablet Take 1 to 2 tablets po QHS prn restless legs .   No facility-administered encounter medications on file as of 07/18/2020.    Surgical History: Past Surgical History:  Procedure Laterality Date  . BACK SURGERY    . MOHS SURGERY    . NECK SURGERY    . TONSILLECTOMY      Medical History: Past Medical History:  Diagnosis Date  . Allergy   . Anxiety   . Arthritis   . Cancer ([place])    skin  . Depression   . GERD (gastroesophageal reflux disease)   . Hypertension   . Migraines  Family History: Family History  Problem Relation Age of Onset  . Congestive Heart Failure Mother   . COPD Mother   . Emphysema Mother   . Hypertension Mother   . Kidney disease Mother   . Cancer Father   . Kidney disease Father   . Bladder Cancer Neg Hx       Review of Systems  Constitutional: Negative for activity change, chills, fatigue and unexpected weight change.  HENT: Negative for congestion, rhinorrhea, sneezing and sore throat.   Respiratory: Negative for cough, chest tightness, shortness of breath and wheezing.   Cardiovascular: Negative for chest pain, palpitations and leg swelling.       Bilateral lower leg pain. This has improved since her most  recent visit with me.   Gastrointestinal: Negative for abdominal pain, constipation, diarrhea, nausea and vomiting.  Endocrine: Negative for cold intolerance, heat intolerance, polydipsia and polyuria.  Genitourinary: Positive for frequency. Negative for dysuria and urgency.  Musculoskeletal: Positive for arthralgias and myalgias. Negative for back pain, joint swelling and neck pain.       Bilateral knee pain which has improved since her last visit .  Skin: Negative for rash.  Allergic/Immunologic: Negative for environmental allergies.  Neurological: Negative for dizziness, tremors, numbness and headaches.  Hematological: Negative for adenopathy. Does not bruise/bleed easily.  Psychiatric/Behavioral: Negative for behavioral problems and sleep disturbance. The patient is nervous/anxious.      Today's Vitals   07/18/20 1414  BP: (!) 117/52  Pulse: (!) 58  Resp: 16  Temp: 97.6 F (36.4 C)  SpO2: 96%  Weight: 134 lb 3.2 oz (60.9 kg)  Height: 5\' 6"  (1.676 m)   Body mass index is 21.66 kg/m.  Physical Exam Vitals and nursing note reviewed.  Constitutional:      General: She is not in acute distress.    Appearance: Normal appearance. She is well-developed. She is not diaphoretic.  HENT:     Head: Normocephalic and atraumatic.     Nose: Nose normal.     Mouth/Throat:     Pharynx: No oropharyngeal exudate.  Eyes:     Conjunctiva/sclera: Conjunctivae normal.     Pupils: Pupils are equal, round, and reactive to light.  Neck:     Thyroid: No thyromegaly.     Vascular: No carotid bruit or JVD.     Trachea: No tracheal deviation.  Cardiovascular:     Rate and Rhythm: Normal rate and regular rhythm.     Pulses: Normal pulses.     Heart sounds: Normal heart sounds. No murmur heard.  No friction rub. No gallop.      Comments: Bilateral leg pain, lower extremities feelwarm to the touch. Pulses are palpable, bilaterally. She has good capillary refill. No edema is present in either foot  or lower leg.  Pulmonary:     Effort: Pulmonary effort is normal. No respiratory distress.     Breath sounds: Normal breath sounds. No wheezing or rales.  Chest:     Chest wall: No tenderness.     Breasts:        Right: Normal. No swelling, bleeding, inverted nipple, mass, nipple discharge, skin change or tenderness.        Left: Normal. No swelling, bleeding, inverted nipple, mass, nipple discharge, skin change or tenderness.  Abdominal:     General: Bowel sounds are normal.     Palpations: Abdomen is soft.     Tenderness: There is no abdominal tenderness.  Musculoskeletal:  General: Normal range of motion.     Cervical back: Normal range of motion and neck supple.     Comments:  Bilateral knee pain/tenderness. ROM and strength is intact at this time.   Lymphadenopathy:     Cervical: No cervical adenopathy.     Upper Body:     Right upper body: No axillary adenopathy.     Left upper body: No axillary adenopathy.  Skin:    General: Skin is warm and dry.     Capillary Refill: Capillary refill takes 2 to 3 seconds.     Comments: Skin of bilateral lower extremities is cool to touch.   Neurological:     General: No focal deficit present.     Mental Status: She is alert and oriented to person, place, and time.     Cranial Nerves: No cranial nerve deficit.  Psychiatric:        Behavior: Behavior normal.        Thought Content: Thought content normal.        Judgment: Judgment normal.     Depression screen [facility] 2/9 04/20/2020 01/18/2020 09/02/2019 08/13/2019 06/28/2019  Decreased Interest 0 0 0 0 0  Down, Depressed, Hopeless 1 0 0 0 0  PHQ - 2 Score 1 0 0 0 0    Functional Status Survey: Is the patient deaf or have difficulty hearing?: Yes (Slightly hard of hearing) Does the patient have difficulty seeing, even when wearing glasses/contacts?: No Does the patient have difficulty concentrating, remembering, or making decisions?: No Does the patient have difficulty walking or  climbing stairs?: No Does the patient have difficulty dressing or bathing?: No Does the patient have difficulty doing errands alone such as visiting a doctor's office or shopping?: No  MMSE - [place] Exam 07/18/2020 06/28/2019 06/23/2018  Orientation to time 5 5 5   Orientation to Place 5 5 5   Registration 3 3 3   Attention/ Calculation 5 5 5   Recall 3 3 3   Language- name 2 objects 2 2 2   Language- repeat 1 1 1   Language- follow 3 step command 3 3 3   Language- read & follow direction 1 1 1   Write a sentence 1 1 1   Copy design 1 1 1   Total score 30 30 30     Fall Risk  07/18/2020 04/20/2020 01/18/2020 09/02/2019 08/13/2019  Falls in the past year? 1 1 0 0 0  Number falls in past yr: 0 0 - 0 0  Injury with Fall? 0 0 - 0 0      LABS: Recent Results (from the past 2160 hour(s))  UA/M w/rflx Culture, Routine     Status: Abnormal   Collection Time: 07/18/20  4:09 PM   Specimen: Urine   Urine  Result Value Ref Range   Specific Gravity, UA 1.021 1.005 - 1.030   pH, UA 5.0 5.0 - 7.5   Color, UA Yellow Yellow   Appearance Ur Clear Clear   Leukocytes,UA 1+ (A) Negative   Protein,UA Negative Negative/Trace   Glucose, UA Negative Negative   Ketones, UA Negative Negative   RBC, UA Negative Negative   Bilirubin, UA Negative Negative   Urobilinogen, Ur 0.2 0.2 - 1.0 mg/dL   Nitrite, UA Negative Negative   Microscopic Examination See below:     Comment: Microscopic was indicated and was performed.   Urinalysis Reflex Comment     Comment: This specimen has reflexed to a Urine Culture.  Microscopic Examination     Status: Abnormal  Collection Time: 07/18/20  4:09 PM   Urine  Result Value Ref Range   WBC, UA 11-30 (A) 0 - 5 /hpf   RBC None seen 0 - 2 /hpf   Epithelial Cells (non renal) 0-10 0 - 10 /hpf   Casts None seen None seen /lpf   Bacteria, UA None seen None seen/Few  Urine Culture, Reflex     Status: None   Collection Time: 07/18/20  4:09 PM   Urine  Result Value Ref  Range   Urine Culture, Routine Final report    Organism ID, Bacteria Comment     Comment: Mixed urogenital flora Less than 10,000 colonies/mL     Assessment/Plan: 1. Encounter for general adult medical examination with abnormal findings Annual health maintenance exam today  2. Essential hypertension Stable. Continue bp medication as prescribed   3. Lumbosacral radiculopathy due to intervertebral disc disorder Imrpoved. Plans to continue with visits to chiropractor. She can continue to take gabapentin 100-200mg  every evening to help with RLS and neuropathy. Apply diclofenac gel up to four times daily as needed for pain.  - gabapentin (NEURONTIN) 100 MG capsule; Take 1 to 2 capsules po QPM for RLS  Dispense: 60 capsule; Refill: 3 - diclofenac Sodium (VOLTAREN) 1 % GEL; Apply 4 g topically 4 (four) times daily.  Dispense: 100 g; Refill: 3  4. Restless leg syndrome, controlled May continue ropinirole 1-2 tablets at bedtime to reduce symptoms of RLS.  - rOPINIRole (REQUIP) 1 MG tablet; Take 1 to 2 tablets po QHS prn restless legs .  Dispense: 60 tablet; Refill: 3  5. Generalized anxiety disorder May take alprazolam 0.5mg  daily if needed for acute anxiety.  - ALPRAZolam (XANAX) 0.5 MG tablet; Take 1 tablet po QD prn  Dispense: 30 tablet; Refill: 3  6. Dysuria - UA/M w/rflx Culture, Routine  General Counseling: Sandra Davidson verbalizes understanding of the findings of todays visit and agrees with plan of treatment. I have discussed any further diagnostic evaluation that may be needed or ordered today. We also reviewed her medications today. she has been encouraged to call the office with any questions or concerns that should arise related to todays visit.    Counseling:   This patient was seen by Leretha Pol FNP Collaboration with Dr Lavera Guise as a part of collaborative care agreement  Orders Placed This Encounter  Procedures  . Microscopic Examination  . Urine Culture, Reflex  . UA/M  w/rflx Culture, Routine    Meds ordered this encounter  Medications  . ALPRAZolam (XANAX) 0.5 MG tablet    Sig: Take 1 tablet po QD prn    Dispense:  30 tablet    Refill:  3    Order Specific Question:   Supervising Provider    Answer:   Lavera Guise [place]  . gabapentin (NEURONTIN) 100 MG capsule    Sig: Take 1 to 2 capsules po QPM for RLS    Dispense:  60 capsule    Refill:  3    Order Specific Question:   Supervising Provider    Answer:   Lavera Guise [place]  . nitrofurantoin (MACRODANTIN) 100 MG capsule    Sig: Take 1 capsule (100 mg total) by mouth daily.    Dispense:  30 capsule    Refill:  3    Order Specific Question:   Supervising Provider    Answer:   Lavera Guise [[id number]]  . rOPINIRole (REQUIP) 1 MG tablet    Sig: Take  1 to 2 tablets po QHS prn restless legs .    Dispense:  60 tablet    Refill:  3    Please note increased dose    Order Specific Question:   Supervising Provider    Answer:   Lavera Guise [[id number]]  . diclofenac Sodium (VOLTAREN) 1 % GEL    Sig: Apply 4 g topically 4 (four) times daily.    Dispense:  100 g    Refill:  3    Order Specific Question:   Supervising Provider    Answer:   Lavera Guise [[id number]]    Total time spent: 47 Minutes  Time spent includes review of chart, medications, test results, and follow up plan with the patient.     Lavera Guise, MD  Internal Medicine

## 2020-07-19 NOTE — [place] Notes (Signed)
Waiting on culture and sensitivity results before adding new or more antibiotics.

## 2020-07-21 DIAGNOSIS — M5412 Radiculopathy, cervical region: Secondary | ICD-10-CM | POA: Diagnosis not present

## 2020-07-21 DIAGNOSIS — M9903 Segmental and somatic dysfunction of lumbar region: Secondary | ICD-10-CM | POA: Diagnosis not present

## 2020-07-21 DIAGNOSIS — M9901 Segmental and somatic dysfunction of cervical region: Secondary | ICD-10-CM | POA: Diagnosis not present

## 2020-07-21 DIAGNOSIS — M4306 Spondylolysis, lumbar region: Secondary | ICD-10-CM | POA: Diagnosis not present

## 2020-07-21 LAB — UA/M W/RFLX CULTURE, ROUTINE
Bilirubin, UA: NEGATIVE
Glucose, UA: NEGATIVE
Ketones, UA: NEGATIVE
Nitrite, UA: NEGATIVE
Protein,UA: NEGATIVE
RBC, UA: NEGATIVE
Specific Gravity, UA: 1.021 (ref 1.005–1.030)
Urobilinogen, Ur: 0.2 mg/dL (ref 0.2–1.0)
pH, UA: 5 (ref 5.0–7.5)

## 2020-07-21 LAB — URINE CULTURE, REFLEX

## 2020-07-21 LAB — MICROSCOPIC EXAMINATION
Bacteria, UA: NONE SEEN
Casts: NONE SEEN /lpf
RBC, Urine: NONE SEEN /hpf (ref 0–2)

## 2020-07-21 NOTE — [place] Notes (Signed)
Only low growth of normal flora. She is already on preventive antibiotics.

## 2020-07-25 DIAGNOSIS — M9901 Segmental and somatic dysfunction of cervical region: Secondary | ICD-10-CM | POA: Diagnosis not present

## 2020-07-25 DIAGNOSIS — M5412 Radiculopathy, cervical region: Secondary | ICD-10-CM | POA: Diagnosis not present

## 2020-07-25 DIAGNOSIS — M9903 Segmental and somatic dysfunction of lumbar region: Secondary | ICD-10-CM | POA: Diagnosis not present

## 2020-07-25 DIAGNOSIS — M4306 Spondylolysis, lumbar region: Secondary | ICD-10-CM | POA: Diagnosis not present

## 2020-08-01 DIAGNOSIS — M5412 Radiculopathy, cervical region: Secondary | ICD-10-CM | POA: Diagnosis not present

## 2020-08-01 DIAGNOSIS — M9901 Segmental and somatic dysfunction of cervical region: Secondary | ICD-10-CM | POA: Diagnosis not present

## 2020-08-01 DIAGNOSIS — M4306 Spondylolysis, lumbar region: Secondary | ICD-10-CM | POA: Diagnosis not present

## 2020-08-01 DIAGNOSIS — M9903 Segmental and somatic dysfunction of lumbar region: Secondary | ICD-10-CM | POA: Diagnosis not present

## 2020-08-01 DIAGNOSIS — M170 Bilateral primary osteoarthritis of knee: Secondary | ICD-10-CM | POA: Diagnosis not present

## 2020-08-09 DIAGNOSIS — M4306 Spondylolysis, lumbar region: Secondary | ICD-10-CM | POA: Diagnosis not present

## 2020-08-09 DIAGNOSIS — M5412 Radiculopathy, cervical region: Secondary | ICD-10-CM | POA: Diagnosis not present

## 2020-08-09 DIAGNOSIS — M9901 Segmental and somatic dysfunction of cervical region: Secondary | ICD-10-CM | POA: Diagnosis not present

## 2020-08-09 DIAGNOSIS — M9903 Segmental and somatic dysfunction of lumbar region: Secondary | ICD-10-CM | POA: Diagnosis not present

## 2020-08-11 DIAGNOSIS — Z1152 Encounter for screening for COVID-19: Secondary | ICD-10-CM | POA: Diagnosis not present

## 2020-08-11 DIAGNOSIS — Z20822 Contact with and (suspected) exposure to covid-19: Secondary | ICD-10-CM | POA: Diagnosis not present

## 2020-08-11 DIAGNOSIS — Z03818 Encounter for observation for suspected exposure to other biological agents ruled out: Secondary | ICD-10-CM | POA: Diagnosis not present

## 2020-08-16 DIAGNOSIS — M4306 Spondylolysis, lumbar region: Secondary | ICD-10-CM | POA: Diagnosis not present

## 2020-08-16 DIAGNOSIS — M9901 Segmental and somatic dysfunction of cervical region: Secondary | ICD-10-CM | POA: Diagnosis not present

## 2020-08-16 DIAGNOSIS — M9903 Segmental and somatic dysfunction of lumbar region: Secondary | ICD-10-CM | POA: Diagnosis not present

## 2020-08-16 DIAGNOSIS — M5412 Radiculopathy, cervical region: Secondary | ICD-10-CM | POA: Diagnosis not present

## 2020-08-21 DIAGNOSIS — M9901 Segmental and somatic dysfunction of cervical region: Secondary | ICD-10-CM | POA: Diagnosis not present

## 2020-08-21 DIAGNOSIS — M5412 Radiculopathy, cervical region: Secondary | ICD-10-CM | POA: Diagnosis not present

## 2020-08-21 DIAGNOSIS — M4306 Spondylolysis, lumbar region: Secondary | ICD-10-CM | POA: Diagnosis not present

## 2020-08-21 DIAGNOSIS — M9903 Segmental and somatic dysfunction of lumbar region: Secondary | ICD-10-CM | POA: Diagnosis not present

## 2020-08-28 DIAGNOSIS — M9901 Segmental and somatic dysfunction of cervical region: Secondary | ICD-10-CM | POA: Diagnosis not present

## 2020-08-28 DIAGNOSIS — M9903 Segmental and somatic dysfunction of lumbar region: Secondary | ICD-10-CM | POA: Diagnosis not present

## 2020-08-28 DIAGNOSIS — M5412 Radiculopathy, cervical region: Secondary | ICD-10-CM | POA: Diagnosis not present

## 2020-08-28 DIAGNOSIS — M4306 Spondylolysis, lumbar region: Secondary | ICD-10-CM | POA: Diagnosis not present

## 2020-09-04 DIAGNOSIS — M4306 Spondylolysis, lumbar region: Secondary | ICD-10-CM | POA: Diagnosis not present

## 2020-09-04 DIAGNOSIS — M9903 Segmental and somatic dysfunction of lumbar region: Secondary | ICD-10-CM | POA: Diagnosis not present

## 2020-09-04 DIAGNOSIS — M5412 Radiculopathy, cervical region: Secondary | ICD-10-CM | POA: Diagnosis not present

## 2020-09-04 DIAGNOSIS — M9901 Segmental and somatic dysfunction of cervical region: Secondary | ICD-10-CM | POA: Diagnosis not present

## 2020-09-18 DIAGNOSIS — M4306 Spondylolysis, lumbar region: Secondary | ICD-10-CM | POA: Diagnosis not present

## 2020-09-18 DIAGNOSIS — M5412 Radiculopathy, cervical region: Secondary | ICD-10-CM | POA: Diagnosis not present

## 2020-09-18 DIAGNOSIS — M9901 Segmental and somatic dysfunction of cervical region: Secondary | ICD-10-CM | POA: Diagnosis not present

## 2020-09-18 DIAGNOSIS — M9903 Segmental and somatic dysfunction of lumbar region: Secondary | ICD-10-CM | POA: Diagnosis not present

## 2020-09-20 DIAGNOSIS — M5412 Radiculopathy, cervical region: Secondary | ICD-10-CM | POA: Diagnosis not present

## 2020-09-20 DIAGNOSIS — M4306 Spondylolysis, lumbar region: Secondary | ICD-10-CM | POA: Diagnosis not present

## 2020-09-20 DIAGNOSIS — M9901 Segmental and somatic dysfunction of cervical region: Secondary | ICD-10-CM | POA: Diagnosis not present

## 2020-09-20 DIAGNOSIS — M9903 Segmental and somatic dysfunction of lumbar region: Secondary | ICD-10-CM | POA: Diagnosis not present

## 2020-09-28 ENCOUNTER — Other Ambulatory Visit: Payer: Self-pay

## 2020-09-28 DIAGNOSIS — I10 Essential (primary) hypertension: Secondary | ICD-10-CM

## 2020-09-28 MED ORDER — BENAZEPRIL HCL 20 MG PO TABS: 20.0000 mg | ORAL_TABLET | Freq: Every day | ORAL | 0 refills | Status: DC

## 2020-10-05 ENCOUNTER — Encounter: Payer: Self-pay | Admitting: Internal Medicine

## 2020-10-05 ENCOUNTER — Other Ambulatory Visit: Payer: Self-pay

## 2020-10-05 ENCOUNTER — Ambulatory Visit (INDEPENDENT_AMBULATORY_CARE_PROVIDER_SITE_OTHER): Payer: PPO | Admitting: Internal Medicine

## 2020-10-05 VITALS — BP 110/54 | HR 64 | Temp 98.0°F | Resp 16 | Ht 67.5 in | Wt 132.4 lb

## 2020-10-05 DIAGNOSIS — H6693 Otitis media, unspecified, bilateral: Secondary | ICD-10-CM | POA: Diagnosis not present

## 2020-10-05 DIAGNOSIS — I9589 Other hypotension: Secondary | ICD-10-CM | POA: Diagnosis not present

## 2020-10-05 DIAGNOSIS — H9201 Otalgia, right ear: Secondary | ICD-10-CM | POA: Diagnosis not present

## 2020-10-05 MED ORDER — LEVOFLOXACIN 500 MG PO TABS: 500.0000 mg | ORAL_TABLET | Freq: Every day | ORAL | 0 refills | Status: DC

## 2020-10-05 NOTE — [place] Notes (Signed)
[facility] [place], [place] [address]  Internal MEDICINE  Office Visit Note  Patient Name: Sandra Davidson  [id number]  [id number]  Date of Service: 10/12/2020  Chief Complaint  Patient presents with  . Ear Pain    right worse than left, started mid week last week.  crackling sound in right ear    HPI  Pt is here with c/o bilateral ear pain, mild right sided facial swelling as well, denies any tooth ache but is uncomfortable due to pain  Blood pressure is noted to be low as well, has been feeling dizzy at times   Current Medication: Outpatient Encounter Medications as of 10/05/2020  Medication Sig  . ALPRAZolam (XANAX) 0.5 MG tablet Take 1 tablet po QD prn  . benazepril (LOTENSIN) 20 MG tablet Take 1 tablet (20 mg total) by mouth daily. (Patient taking differently: Take 20 mg by mouth daily. Pt takes 1/2 tablet daily)  . diclofenac Sodium (VOLTAREN) 1 % GEL Apply 4 g topically 4 (four) times daily.  Marland Kitchen etodolac (LODINE) 400 MG tablet Take 1 tablet (400 mg total) by mouth daily.  Marland Kitchen gabapentin (NEURONTIN) 100 MG capsule Take 1 to 2 capsules po QPM for RLS  . ibuprofen (ADVIL) 600 MG tablet Take 1 tablet (600 mg total) by mouth every 8 (eight) hours as needed.  . meclizine (ANTIVERT) 25 MG tablet Take 1 tablet (25 mg total) by mouth 3 (three) times daily as needed for dizziness.  . nitrofurantoin (MACRODANTIN) 100 MG capsule Take 100 mg by mouth every other day.  Marland Kitchen rOPINIRole (REQUIP) 1 MG tablet Take 1 to 2 tablets po QHS prn restless legs .  . spironolactone (ALDACTONE) 25 MG tablet Take 1 tablet (25 mg total) by mouth daily.  . [DISCONTINUED] nitrofurantoin (MACRODANTIN) 100 MG capsule Take 1 capsule (100 mg total) by mouth daily.  . [DISCONTINUED] fluconazole (DIFLUCAN) 150 MG tablet Take one tablet po every other day for total of 5 doses as needed . (Patient not taking: Reported on 10/05/2020)  . [DISCONTINUED] levofloxacin (LEVAQUIN) 500 MG tablet Take 1  tablet (500 mg total) by mouth daily. For ear infection   No facility-administered encounter medications on file as of 10/05/2020.    Surgical History: Past Surgical History:  Procedure Laterality Date  . BACK SURGERY    . MOHS SURGERY    . NECK SURGERY    . TONSILLECTOMY      Medical History: Past Medical History:  Diagnosis Date  . Allergy   . Anxiety   . Arthritis   . Cancer ([place])    skin  . Depression   . GERD (gastroesophageal reflux disease)   . Hypertension   . Migraines     Family History: Family History  Problem Relation Age of Onset  . Congestive Heart Failure Mother   . COPD Mother   . Emphysema Mother   . Hypertension Mother   . Kidney disease Mother   . Cancer Father   . Kidney disease Father   . Bladder Cancer Neg Hx     Social History   Socioeconomic History  . Marital status: Divorced    Spouse name: Not on file  . Number of children: Not on file  . Years of education: Not on file  . Highest education level: Not on file  Occupational History  . Not on file  Tobacco Use  . Smoking status: Former Smoker    Types: Cigarettes  . Smokeless tobacco: Never Used  Vaping Use  . Vaping Use: Every day  Substance and Sexual Activity  . Alcohol use: No  . Drug use: No  . Sexual activity: Not Currently  Other Topics Concern  . Not on file  Social History Narrative  . Not on file   Social Determinants of Health   Financial Resource Strain:   . Difficulty of Paying Living Expenses: Not on file  Food Insecurity:   . Worried About Charity fundraiser in the Last Year: Not on file  . Ran Out of Food in the Last Year: Not on file  Transportation Needs:   . Lack of Transportation (Medical): Not on file  . Lack of Transportation (Non-Medical): Not on file  Physical Activity:   . Days of Exercise per Week: Not on file  . Minutes of Exercise per Session: Not on file  Stress:   . Feeling of Stress : Not on file  Social Connections:   .  Frequency of Communication with Friends and Family: Not on file  . Frequency of Social Gatherings with Friends and Family: Not on file  . Attends Religious Services: Not on file  . Active Member of Clubs or Organizations: Not on file  . Attends Archivist Meetings: Not on file  . Marital Status: Not on file  Intimate Partner Violence:   . Fear of Current or Ex-Partner: Not on file  . Emotionally Abused: Not on file  . Physically Abused: Not on file  . Sexually Abused: Not on file      Review of Systems  Constitutional: Negative for chills and fever.  HENT: Positive for facial swelling, postnasal drip, rhinorrhea and sinus pain.   Eyes: Negative.   Respiratory: Negative.   Cardiovascular: Negative.   Neurological: Positive for weakness. Negative for facial asymmetry.  Psychiatric/Behavioral: Negative.     Vital Signs: BP (!) 110/54   Pulse 64   Temp 98 F (36.7 C)   Resp 16   Ht 5' 7.5" (1.715 m)   Wt 132 lb 6.4 oz (60.1 kg)   SpO2 99%   BMI 20.43 kg/m    Physical Exam Constitutional:      Appearance: Normal appearance.  HENT:     Head: Normocephalic and atraumatic.     Jaw: Swelling present.     Salivary Glands: Right salivary gland is diffusely enlarged and tender.     Right Ear: Decreased hearing noted. Swelling and tenderness present. A middle ear effusion is present.     Left Ear: No swelling or tenderness. A middle ear effusion is present.     Nose: Nose normal.  Cardiovascular:     Rate and Rhythm: Normal rate and regular rhythm.     Pulses: Normal pulses.     Heart sounds: Normal heart sounds.  Neurological:     Mental Status: She is alert.    Assessment/Plan: 1. Iatrogenic hypotension Will stop spironolactone for low bp. She is taking only 10 mg of lotensin, might have to stop if Bp continues to be low   2. Acute bilateral otitis media Start Levaquin 500mg  po qd x 10 days.   3. Pain of right mastoid Take motrin at home however if pain  and swelling persists, she might need CT of head and neck to look at salivary glands and other pathology of head and neck   General Counseling: Alysha verbalizes understanding of the findings of todays visit and agrees with plan of treatment. I have discussed any further diagnostic evaluation  that may be needed or ordered today. We also reviewed her medications today. she has been encouraged to call the office with any questions or concerns that should arise related to todays visit.   Meds ordered this encounter  Medications  . DISCONTD: levofloxacin (LEVAQUIN) 500 MG tablet    Sig: Take 1 tablet (500 mg total) by mouth daily. For ear infection    Dispense:  7 tablet    Refill:  0    Total time spent:35 Minutes Time spent includes review of chart, medications, test results, and follow up plan with the patient.      Dr Lavera Guise Internal medicine

## 2020-10-09 ENCOUNTER — Telehealth: Payer: Self-pay

## 2020-10-09 NOTE — Telephone Encounter (Signed)
She probably need a CT scan

## 2020-10-09 NOTE — Telephone Encounter (Signed)
I called her, please try her for me

## 2020-10-11 ENCOUNTER — Other Ambulatory Visit: Payer: Self-pay

## 2020-10-11 MED ORDER — LEVOFLOXACIN 500 MG PO TABS: 500.0000 mg | ORAL_TABLET | Freq: Every day | ORAL | 0 refills | Status: DC

## 2020-10-17 DIAGNOSIS — M4306 Spondylolysis, lumbar region: Secondary | ICD-10-CM | POA: Diagnosis not present

## 2020-10-17 DIAGNOSIS — M5412 Radiculopathy, cervical region: Secondary | ICD-10-CM | POA: Diagnosis not present

## 2020-10-17 DIAGNOSIS — M9901 Segmental and somatic dysfunction of cervical region: Secondary | ICD-10-CM | POA: Diagnosis not present

## 2020-10-17 DIAGNOSIS — M9903 Segmental and somatic dysfunction of lumbar region: Secondary | ICD-10-CM | POA: Diagnosis not present

## 2020-10-18 DIAGNOSIS — M5412 Radiculopathy, cervical region: Secondary | ICD-10-CM | POA: Diagnosis not present

## 2020-10-18 DIAGNOSIS — M4306 Spondylolysis, lumbar region: Secondary | ICD-10-CM | POA: Diagnosis not present

## 2020-10-18 DIAGNOSIS — M9901 Segmental and somatic dysfunction of cervical region: Secondary | ICD-10-CM | POA: Diagnosis not present

## 2020-10-18 DIAGNOSIS — M9903 Segmental and somatic dysfunction of lumbar region: Secondary | ICD-10-CM | POA: Diagnosis not present

## 2020-10-20 DIAGNOSIS — M9903 Segmental and somatic dysfunction of lumbar region: Secondary | ICD-10-CM | POA: Diagnosis not present

## 2020-10-20 DIAGNOSIS — M9901 Segmental and somatic dysfunction of cervical region: Secondary | ICD-10-CM | POA: Diagnosis not present

## 2020-10-20 DIAGNOSIS — M5412 Radiculopathy, cervical region: Secondary | ICD-10-CM | POA: Diagnosis not present

## 2020-10-20 DIAGNOSIS — M4306 Spondylolysis, lumbar region: Secondary | ICD-10-CM | POA: Diagnosis not present

## 2020-10-23 DIAGNOSIS — M9901 Segmental and somatic dysfunction of cervical region: Secondary | ICD-10-CM | POA: Diagnosis not present

## 2020-10-23 DIAGNOSIS — M4306 Spondylolysis, lumbar region: Secondary | ICD-10-CM | POA: Diagnosis not present

## 2020-10-23 DIAGNOSIS — M9903 Segmental and somatic dysfunction of lumbar region: Secondary | ICD-10-CM | POA: Diagnosis not present

## 2020-10-23 DIAGNOSIS — M5412 Radiculopathy, cervical region: Secondary | ICD-10-CM | POA: Diagnosis not present

## 2020-10-25 DIAGNOSIS — M5412 Radiculopathy, cervical region: Secondary | ICD-10-CM | POA: Diagnosis not present

## 2020-10-25 DIAGNOSIS — M9903 Segmental and somatic dysfunction of lumbar region: Secondary | ICD-10-CM | POA: Diagnosis not present

## 2020-10-25 DIAGNOSIS — M9901 Segmental and somatic dysfunction of cervical region: Secondary | ICD-10-CM | POA: Diagnosis not present

## 2020-10-25 DIAGNOSIS — M4306 Spondylolysis, lumbar region: Secondary | ICD-10-CM | POA: Diagnosis not present

## 2020-10-30 DIAGNOSIS — M5412 Radiculopathy, cervical region: Secondary | ICD-10-CM | POA: Diagnosis not present

## 2020-10-30 DIAGNOSIS — M9901 Segmental and somatic dysfunction of cervical region: Secondary | ICD-10-CM | POA: Diagnosis not present

## 2020-10-30 DIAGNOSIS — M9903 Segmental and somatic dysfunction of lumbar region: Secondary | ICD-10-CM | POA: Diagnosis not present

## 2020-10-30 DIAGNOSIS — M4306 Spondylolysis, lumbar region: Secondary | ICD-10-CM | POA: Diagnosis not present

## 2020-11-01 DIAGNOSIS — M4306 Spondylolysis, lumbar region: Secondary | ICD-10-CM | POA: Diagnosis not present

## 2020-11-01 DIAGNOSIS — M9901 Segmental and somatic dysfunction of cervical region: Secondary | ICD-10-CM | POA: Diagnosis not present

## 2020-11-01 DIAGNOSIS — M5412 Radiculopathy, cervical region: Secondary | ICD-10-CM | POA: Diagnosis not present

## 2020-11-01 DIAGNOSIS — M9903 Segmental and somatic dysfunction of lumbar region: Secondary | ICD-10-CM | POA: Diagnosis not present

## 2020-11-06 DIAGNOSIS — M9901 Segmental and somatic dysfunction of cervical region: Secondary | ICD-10-CM | POA: Diagnosis not present

## 2020-11-06 DIAGNOSIS — M9903 Segmental and somatic dysfunction of lumbar region: Secondary | ICD-10-CM | POA: Diagnosis not present

## 2020-11-06 DIAGNOSIS — M170 Bilateral primary osteoarthritis of knee: Secondary | ICD-10-CM | POA: Diagnosis not present

## 2020-11-06 DIAGNOSIS — M4306 Spondylolysis, lumbar region: Secondary | ICD-10-CM | POA: Diagnosis not present

## 2020-11-06 DIAGNOSIS — M5412 Radiculopathy, cervical region: Secondary | ICD-10-CM | POA: Diagnosis not present

## 2020-11-07 DIAGNOSIS — D485 Neoplasm of uncertain behavior of skin: Secondary | ICD-10-CM | POA: Diagnosis not present

## 2020-11-07 DIAGNOSIS — C44629 Squamous cell carcinoma of skin of left upper limb, including shoulder: Secondary | ICD-10-CM | POA: Diagnosis not present

## 2020-11-08 ENCOUNTER — Other Ambulatory Visit: Payer: Self-pay | Admitting: Internal Medicine

## 2020-11-08 DIAGNOSIS — Z0001 Encounter for general adult medical examination with abnormal findings: Secondary | ICD-10-CM | POA: Diagnosis not present

## 2020-11-08 DIAGNOSIS — E785 Hyperlipidemia, unspecified: Secondary | ICD-10-CM | POA: Diagnosis not present

## 2020-11-08 DIAGNOSIS — I10 Essential (primary) hypertension: Secondary | ICD-10-CM | POA: Diagnosis not present

## 2020-11-09 LAB — CBC WITH DIFFERENTIAL/PLATELET
Basophils Absolute: 0 10*3/uL (ref 0.0–0.2)
Basos: 0 %
EOS (ABSOLUTE): 0 10*3/uL (ref 0.0–0.4)
Eos: 0 %
Hematocrit: 40.9 % (ref 34.0–46.6)
Hemoglobin: 13.5 g/dL (ref 11.1–15.9)
Immature Grans (Abs): 0.1 10*3/uL (ref 0.0–0.1)
Immature Granulocytes: 1 %
Lymphocytes Absolute: 1.4 10*3/uL (ref 0.7–3.1)
Lymphs: 12 %
MCH: 31.4 pg (ref 26.6–33.0)
MCHC: 33 g/dL (ref 31.5–35.7)
MCV: 95 fL (ref 79–97)
Monocytes Absolute: 0.7 10*3/uL (ref 0.1–0.9)
Monocytes: 6 %
Neutrophils Absolute: 9.3 10*3/uL — ABNORMAL HIGH (ref 1.4–7.0)
Neutrophils: 81 %
Platelets: 186 10*3/uL (ref 150–450)
RBC: 4.3 x10E6/uL (ref 3.77–5.28)
RDW: 12.9 % (ref 11.7–15.4)
WBC: 11.5 10*3/uL — ABNORMAL HIGH (ref 3.4–10.8)

## 2020-11-09 LAB — TSH: TSH: 0.579 u[IU]/mL (ref 0.450–4.500)

## 2020-11-09 LAB — COMPREHENSIVE METABOLIC PANEL
ALT: 12 IU/L (ref 0–32)
AST: 20 IU/L (ref 0–40)
Albumin/Globulin Ratio: 2.2 (ref 1.2–2.2)
Albumin: 4.8 g/dL — ABNORMAL HIGH (ref 3.6–4.6)
Alkaline Phosphatase: 67 IU/L (ref 44–121)
BUN/Creatinine Ratio: 26 (ref 12–28)
BUN: 24 mg/dL (ref 8–27)
Bilirubin Total: 0.3 mg/dL (ref 0.0–1.2)
CO2: 20 mmol/L (ref 20–29)
Calcium: 9.8 mg/dL (ref 8.7–10.3)
Chloride: 102 mmol/L (ref 96–106)
Creatinine, Ser: 0.92 mg/dL (ref 0.57–1.00)
GFR calc Af Amer: 67 mL/min/{1.73_m2} (ref >59)
GFR calc non Af Amer: 58 mL/min/{1.73_m2} — ABNORMAL LOW (ref >59)
Globulin, Total: 2.2 g/dL (ref 1.5–4.5)
Glucose: 101 mg/dL — ABNORMAL HIGH (ref 65–99)
Potassium: 4.4 mmol/L (ref 3.5–5.2)
Sodium: 140 mmol/L (ref 134–144)
Total Protein: 7 g/dL (ref 6.0–8.5)

## 2020-11-09 LAB — LP+NON-HDL CHOLESTEROL
Cholesterol, Total: 200 mg/dL — ABNORMAL HIGH (ref 100–199)
HDL: 59 mg/dL (ref >39)
LDL Chol Calc (NIH): 117 mg/dL — ABNORMAL HIGH (ref 0–99)
Total Non-HDL-Chol (LDL+VLDL): 141 mg/dL — ABNORMAL HIGH (ref 0–129)
Triglycerides: 137 mg/dL (ref 0–149)
VLDL Cholesterol Cal: 24 mg/dL (ref 5–40)

## 2020-11-09 LAB — T4, FREE: Free T4: 1.08 ng/dL (ref 0.82–1.77)

## 2020-11-13 DIAGNOSIS — M5412 Radiculopathy, cervical region: Secondary | ICD-10-CM | POA: Diagnosis not present

## 2020-11-13 DIAGNOSIS — M9901 Segmental and somatic dysfunction of cervical region: Secondary | ICD-10-CM | POA: Diagnosis not present

## 2020-11-13 DIAGNOSIS — M9903 Segmental and somatic dysfunction of lumbar region: Secondary | ICD-10-CM | POA: Diagnosis not present

## 2020-11-13 DIAGNOSIS — M4306 Spondylolysis, lumbar region: Secondary | ICD-10-CM | POA: Diagnosis not present

## 2020-11-20 ENCOUNTER — Ambulatory Visit (INDEPENDENT_AMBULATORY_CARE_PROVIDER_SITE_OTHER): Payer: PPO | Admitting: Nurse Practitioner

## 2020-11-20 ENCOUNTER — Other Ambulatory Visit: Payer: Self-pay

## 2020-11-20 ENCOUNTER — Encounter: Payer: Self-pay | Admitting: Nurse Practitioner

## 2020-11-20 VITALS — BP 136/72 | HR 64 | Temp 97.3°F | Resp 16 | Ht 67.5 in | Wt 129.4 lb

## 2020-11-20 DIAGNOSIS — I10 Essential (primary) hypertension: Secondary | ICD-10-CM

## 2020-11-20 DIAGNOSIS — G2581 Restless legs syndrome: Secondary | ICD-10-CM | POA: Diagnosis not present

## 2020-11-20 DIAGNOSIS — F411 Generalized anxiety disorder: Secondary | ICD-10-CM | POA: Diagnosis not present

## 2020-11-20 DIAGNOSIS — M5417 Radiculopathy, lumbosacral region: Secondary | ICD-10-CM

## 2020-11-20 DIAGNOSIS — M5412 Radiculopathy, cervical region: Secondary | ICD-10-CM | POA: Diagnosis not present

## 2020-11-20 DIAGNOSIS — M9901 Segmental and somatic dysfunction of cervical region: Secondary | ICD-10-CM | POA: Diagnosis not present

## 2020-11-20 DIAGNOSIS — M9903 Segmental and somatic dysfunction of lumbar region: Secondary | ICD-10-CM | POA: Diagnosis not present

## 2020-11-20 DIAGNOSIS — M4306 Spondylolysis, lumbar region: Secondary | ICD-10-CM | POA: Diagnosis not present

## 2020-11-20 MED ORDER — GABAPENTIN 100 MG PO CAPS: ORAL_CAPSULE | ORAL | 3 refills | Status: DC

## 2020-11-20 MED ORDER — BENAZEPRIL HCL 20 MG PO TABS: 20.0000 mg | ORAL_TABLET | Freq: Every day | ORAL | 1 refills | Status: DC

## 2020-11-20 MED ORDER — DICLOFENAC SODIUM 1 % EX GEL: 4.0000 g | Freq: Four times a day (QID) | CUTANEOUS | 3 refills | Status: DC

## 2020-11-20 MED ORDER — IBUPROFEN 600 MG PO TABS: 600.0000 mg | ORAL_TABLET | Freq: Three times a day (TID) | ORAL | 3 refills | Status: DC | PRN

## 2020-11-20 MED ORDER — ROPINIROLE HCL 1 MG PO TABS: ORAL_TABLET | ORAL | 3 refills | Status: DC

## 2020-11-20 MED ORDER — ALPRAZOLAM 0.5 MG PO TABS: ORAL_TABLET | ORAL | 3 refills | Status: DC

## 2020-11-20 MED ORDER — SPIRONOLACTONE 25 MG PO TABS: 25.0000 mg | ORAL_TABLET | Freq: Every day | ORAL | 3 refills | Status: DC

## 2020-11-20 NOTE — Progress Notes (Signed)
[facility] [place], [place] [address]  Internal MEDICINE  Office Visit Note  Patient Name: Sandra Davidson  [id number]  [id number]  Date of Service: 11/20/2020  Chief Complaint  Patient presents with  . Follow-up    Review labs,ears still don't feel right, feel full, can hear gritting when she opens her mouth, DFK took her off fluif pill but pt swelled up after a week and went back on it  . Hypertension  . Gastroesophageal Reflux  . Depression  . Anxiety    The patient is here for routine follow up visit. She states that she is doing well.  -blood pressure well managed.  -had labs done since she was seen last. Very mild elevation of lipid panel. Improved and stable since last check.  -renal functions are best they have been in two years. BUN and creatinine are both within normal limits. GFR minimally low. -other labs normal.  -needs to have refills for all routine medication.      Current Medication: Outpatient Encounter Medications as of 11/20/2020  Medication Sig  . meclizine (ANTIVERT) 25 MG tablet Take 1 tablet (25 mg total) by mouth 3 (three) times daily as needed for dizziness.  . nitrofurantoin (MACRODANTIN) 100 MG capsule Take 100 mg by mouth every other day.  . [DISCONTINUED] ALPRAZolam (XANAX) 0.5 MG tablet Take 1 tablet po QD prn  . [DISCONTINUED] benazepril (LOTENSIN) 20 MG tablet Take 1 tablet (20 mg total) by mouth daily. (Patient taking differently: Take 20 mg by mouth daily. Pt takes 1/2 tablet daily)  . [DISCONTINUED] diclofenac Sodium (VOLTAREN) 1 % GEL Apply 4 g topically 4 (four) times daily.  . [DISCONTINUED] etodolac (LODINE) 400 MG tablet Take 1 tablet (400 mg total) by mouth daily.  . [DISCONTINUED] gabapentin (NEURONTIN) 100 MG capsule Take 1 to 2 capsules po QPM for RLS  . [DISCONTINUED] ibuprofen (ADVIL) 600 MG tablet Take 1 tablet (600 mg total) by mouth every 8 (eight) hours as needed.  . [DISCONTINUED] rOPINIRole  (REQUIP) 1 MG tablet Take 1 to 2 tablets po QHS prn restless legs .  . [DISCONTINUED] spironolactone (ALDACTONE) 25 MG tablet Take 1 tablet (25 mg total) by mouth daily.  Marland Kitchen ALPRAZolam (XANAX) 0.5 MG tablet Take 1 tablet po QD prn  . benazepril (LOTENSIN) 20 MG tablet Take 1 tablet (20 mg total) by mouth daily.  . diclofenac Sodium (VOLTAREN) 1 % GEL Apply 4 g topically 4 (four) times daily.  Marland Kitchen gabapentin (NEURONTIN) 100 MG capsule Take 1 to 2 capsules po QPM for RLS  . ibuprofen (ADVIL) 600 MG tablet Take 1 tablet (600 mg total) by mouth every 8 (eight) hours as needed.  Marland Kitchen rOPINIRole (REQUIP) 1 MG tablet Take 1 to 2 tablets po QHS prn restless legs .  . spironolactone (ALDACTONE) 25 MG tablet Take 1 tablet (25 mg total) by mouth daily.  . [DISCONTINUED] levofloxacin (LEVAQUIN) 500 MG tablet Take 1 tablet (500 mg total) by mouth daily. For ear infection   No facility-administered encounter medications on file as of 11/20/2020.    Surgical History: Past Surgical History:  Procedure Laterality Date  . BACK SURGERY    . MOHS SURGERY    . NECK SURGERY    . TONSILLECTOMY      Medical History: Past Medical History:  Diagnosis Date  . Allergy   . Anxiety   . Arthritis   . Cancer ([place])    skin  . Depression   . GERD (  gastroesophageal reflux disease)   . Hypertension   . Migraines     Family History: Family History  Problem Relation Age of Onset  . Congestive Heart Failure Mother   . COPD Mother   . Emphysema Mother   . Hypertension Mother   . Kidney disease Mother   . Cancer Father   . Kidney disease Father   . Bladder Cancer Neg Hx     Social History   Socioeconomic History  . Marital status: Divorced    Spouse name: Not on file  . Number of children: Not on file  . Years of education: Not on file  . Highest education level: Not on file  Occupational History  . Not on file  Tobacco Use  . Smoking status: Former Smoker    Types: Cigarettes  . Smokeless tobacco:  Never Used  Vaping Use  . Vaping Use: Every day  Substance and Sexual Activity  . Alcohol use: No  . Drug use: No  . Sexual activity: Not Currently  Other Topics Concern  . Not on file  Social History Narrative  . Not on file   Social Determinants of Health   Financial Resource Strain: Not on file  Food Insecurity: Not on file  Transportation Needs: Not on file  Physical Activity: Not on file  Stress: Not on file  Social Connections: Not on file  Intimate Partner Violence: Not on file      Review of Systems  Constitutional: Negative for activity change, chills, fatigue and unexpected weight change.  HENT: Negative for congestion, postnasal drip, rhinorrhea, sneezing and sore throat.   Respiratory: Negative for cough, chest tightness, shortness of breath and wheezing.   Cardiovascular: Negative for chest pain and palpitations.  Gastrointestinal: Negative for abdominal pain, constipation, diarrhea, nausea and vomiting.  Endocrine: Negative for cold intolerance, heat intolerance, polydipsia and polyuria.  Musculoskeletal: Positive for arthralgias and back pain. Negative for joint swelling and neck pain.       Patient does see orthopedics routinely for bilateral knee pain and chronic back pain.   Skin: Negative for rash.  Neurological: Negative for dizziness, tremors, numbness and headaches.  Hematological: Negative for adenopathy. Does not bruise/bleed easily.  Psychiatric/Behavioral: Negative for behavioral problems (Depression), sleep disturbance and suicidal ideas. The patient is nervous/anxious.     Today's Vitals   11/20/20 1423  BP: 136/72  Pulse: 64  Resp: 16  Temp: (!) 97.3 F (36.3 C)  SpO2: 98%  Weight: 129 lb 6.4 oz (58.7 kg)  Height: 5' 7.5" (1.715 m)   Body mass index is 19.97 kg/m.  Physical Exam Vitals and nursing note reviewed.  Constitutional:      General: She is not in acute distress.    Appearance: Normal appearance. She is well-developed and  well-nourished. She is not diaphoretic.  HENT:     Head: Normocephalic and atraumatic.     Nose: Nose normal.     Mouth/Throat:     Mouth: Oropharynx is clear and moist.     Pharynx: No oropharyngeal exudate.  Eyes:     Extraocular Movements: EOM normal.     Pupils: Pupils are equal, round, and reactive to light.  Neck:     Thyroid: No thyromegaly.     Vascular: No carotid bruit or JVD.     Trachea: No tracheal deviation.  Cardiovascular:     Rate and Rhythm: Normal rate and regular rhythm.     Heart sounds: Normal heart sounds. No murmur heard. No  friction rub. No gallop.   Pulmonary:     Effort: Pulmonary effort is normal. No respiratory distress.     Breath sounds: Normal breath sounds. No wheezing or rales.  Chest:     Chest wall: No tenderness.  Abdominal:     Palpations: Abdomen is soft.  Musculoskeletal:        General: Normal range of motion.     Cervical back: Normal range of motion and neck supple.  Lymphadenopathy:     Cervical: No cervical adenopathy.  Skin:    General: Skin is warm and dry.  Neurological:     General: No focal deficit present.     Mental Status: She is alert and oriented to person, place, and time.     Cranial Nerves: No cranial nerve deficit.  Psychiatric:        Mood and Affect: Mood and affect and mood normal.        Behavior: Behavior normal.        Thought Content: Thought content normal.        Judgment: Judgment normal.    Assessment/Plan: 1. Essential hypertension Stable. Continue bp medications as prescribed. Refills provided today.  - benazepril (LOTENSIN) 20 MG tablet; Take 1 tablet (20 mg total) by mouth daily.  Dispense: 90 tablet; Refill: 1 - spironolactone (ALDACTONE) 25 MG tablet; Take 1 tablet (25 mg total) by mouth daily.  Dispense: 90 tablet; Refill: 3  2. Generalized anxiety disorder May take alprazolam 0.5mg  up to daily as needed for acute anxiety. Only taken as needed. A new prescription was sent to her pharmacy  today.  - ALPRAZolam (XANAX) 0.5 MG tablet; Take 1 tablet po QD prn  Dispense: 30 tablet; Refill: 3  3. Lumbosacral radiculopathy due to intervertebral disc disorder May take ibuprofen 600mg  up to three times daily if needed for pain/inflammation. May take gabapentin 100-200mg  at bedtime as needed for RLS. She may also use topical voltaren gel up to four times daily as needed for pain/inflammaiion. Refills for all were sent to her pharmacy today.  - diclofenac Sodium (VOLTAREN) 1 % GEL; Apply 4 g topically 4 (four) times daily.  Dispense: 100 g; Refill: 3 - gabapentin (NEURONTIN) 100 MG capsule; Take 1 to 2 capsules po QPM for RLS  Dispense: 60 capsule; Refill: 3 - ibuprofen (ADVIL) 600 MG tablet; Take 1 tablet (600 mg total) by mouth every 8 (eight) hours as needed.  Dispense: 30 tablet; Refill: 3  4. Restless leg syndrome, controlled Well managed. May continue ropinirole as prescribed  - rOPINIRole (REQUIP) 1 MG tablet; Take 1 to 2 tablets po QHS prn restless legs .  Dispense: 60 tablet; Refill: 3  General Counseling: Sandra Davidson verbalizes understanding of the findings of todays visit and agrees with plan of treatment. I have discussed any further diagnostic evaluation that may be needed or ordered today. We also reviewed her medications today. she has been encouraged to call the office with any questions or concerns that should arise related to todays visit.  This patient was seen by [place] with Dr Lavera Guise as a part of collaborative care agreement  Meds ordered this encounter  Medications  . ALPRAZolam (XANAX) 0.5 MG tablet    Sig: Take 1 tablet po QD prn    Dispense:  30 tablet    Refill:  3    Order Specific Question:   Supervising Provider    Answer:   Lavera Guise [place]  . benazepril (LOTENSIN)  20 MG tablet    Sig: Take 1 tablet (20 mg total) by mouth daily.    Dispense:  90 tablet    Refill:  1    Patient instructed to take 1/2 benazepril daily. Continue  spironolactone as prescribed    Order Specific Question:   Supervising Provider    Answer:   Lavera Guise [[id number]]  . diclofenac Sodium (VOLTAREN) 1 % GEL    Sig: Apply 4 g topically 4 (four) times daily.    Dispense:  100 g    Refill:  3    Order Specific Question:   Supervising Provider    Answer:   Lavera Guise [[id number]]  . gabapentin (NEURONTIN) 100 MG capsule    Sig: Take 1 to 2 capsules po QPM for RLS    Dispense:  60 capsule    Refill:  3    Order Specific Question:   Supervising Provider    Answer:   Lavera Guise [place]  . ibuprofen (ADVIL) 600 MG tablet    Sig: Take 1 tablet (600 mg total) by mouth every 8 (eight) hours as needed.    Dispense:  30 tablet    Refill:  3    Order Specific Question:   Supervising Provider    Answer:   Lavera Guise [[id number]]  . rOPINIRole (REQUIP) 1 MG tablet    Sig: Take 1 to 2 tablets po QHS prn restless legs .    Dispense:  60 tablet    Refill:  3    Order Specific Question:   Supervising Provider    Answer:   Lavera Guise [[id number]]  . spironolactone (ALDACTONE) 25 MG tablet    Sig: Take 1 tablet (25 mg total) by mouth daily.    Dispense:  90 tablet    Refill:  3    Order Specific Question:   Supervising Provider    Answer:   Lavera Guise [[id number]]    Total time spent: 30 Minutes   Time spent includes review of chart, medications, test results, and follow up plan with the patient.      Dr Lavera Guise Internal medicine

## 2020-11-28 ENCOUNTER — Other Ambulatory Visit: Payer: Self-pay

## 2020-11-28 ENCOUNTER — Ambulatory Visit (INDEPENDENT_AMBULATORY_CARE_PROVIDER_SITE_OTHER): Payer: PPO | Admitting: Podiatry

## 2020-11-28 DIAGNOSIS — L601 Onycholysis: Secondary | ICD-10-CM

## 2020-11-28 DIAGNOSIS — L603 Nail dystrophy: Secondary | ICD-10-CM

## 2020-11-28 MED ORDER — GENTAMICIN SULFATE 0.1 % EX CREA: 1.0000 "application " | TOPICAL_CREAM | Freq: Two times a day (BID) | CUTANEOUS | 1 refills | Status: DC

## 2020-11-28 NOTE — Patient Instructions (Signed)

## 2020-11-28 NOTE — Progress Notes (Signed)
   Subjective: Patient presents today for evaluation of pain to the left hallux nail plate. Patient is concerned that the toenail is lifting off of the toe and is pressing against the second digit adjacent to the toenail.  She does have history of total temporary nail avulsion to the toe at some point.  Today she would like to have the nail completely removed permanently.  Patient presents today for further treatment and evaluation.  Past Medical History:  Diagnosis Date  . Allergy   . Anxiety   . Arthritis   . Cancer ([place])    skin  . Depression   . GERD (gastroesophageal reflux disease)   . Hypertension   . Migraines     Objective:  General: Well developed, nourished, in no acute distress, alert and oriented x3   Dermatology: Skin is warm, dry and supple bilateral.  Left hallux nail plate appears to be thickened and dystrophic with onycholysis to the underlying nail bed. Pain on palpation noted. The remaining nails appear unremarkable at this time. There are no open sores, lesions.  Vascular: Dorsalis Pedis artery and Posterior Tibial artery pedal pulses palpable. No lower extremity edema noted.   Neruologic: Grossly intact via light touch bilateral.  Musculoskeletal: Muscular strength within normal limits in all groups bilateral. Normal range of motion noted to all pedal and ankle joints.   Assesement: #1  Nail dystrophy with onycholysis left hallux nail plate  Plan of Care:  1. Patient evaluated.  2. Discussed treatment alternatives and plan of care. Explained nail avulsion procedure and post procedure course to patient. 3. Patient opted for permanent total nail avulsion of the left hallux nail plate.  4. Prior to procedure, local anesthesia infiltration utilized using 3 ml of a 50:50 mixture of 2% plain lidocaine and 0.5% plain marcaine in a normal hallux block fashion and a betadine prep performed.  5.  Total permanent nail avulsion with chemical matrixectomy performed using  [id number] applications of phenol followed by alcohol flush.  6. Light dressing applied. 7.  Prescription for gentamicin cream applied daily  8.  Return to clinic 3 weeks.  Edrick Kins, DPM [facility]  Dr. Edrick Kins, [address]                                        [place], [place] [address]                Office [phone]  Fax [phone]

## 2020-12-12 DIAGNOSIS — M9903 Segmental and somatic dysfunction of lumbar region: Secondary | ICD-10-CM | POA: Diagnosis not present

## 2020-12-12 DIAGNOSIS — M4306 Spondylolysis, lumbar region: Secondary | ICD-10-CM | POA: Diagnosis not present

## 2020-12-12 DIAGNOSIS — M9901 Segmental and somatic dysfunction of cervical region: Secondary | ICD-10-CM | POA: Diagnosis not present

## 2020-12-12 DIAGNOSIS — M5412 Radiculopathy, cervical region: Secondary | ICD-10-CM | POA: Diagnosis not present

## 2020-12-14 DIAGNOSIS — C44629 Squamous cell carcinoma of skin of left upper limb, including shoulder: Secondary | ICD-10-CM | POA: Diagnosis not present

## 2020-12-14 HISTORY — PX: OTHER SURGICAL HISTORY: SHX169

## 2020-12-19 ENCOUNTER — Ambulatory Visit (INDEPENDENT_AMBULATORY_CARE_PROVIDER_SITE_OTHER): Payer: PPO | Admitting: Podiatry

## 2020-12-19 ENCOUNTER — Other Ambulatory Visit: Payer: Self-pay

## 2020-12-19 DIAGNOSIS — L603 Nail dystrophy: Secondary | ICD-10-CM | POA: Diagnosis not present

## 2020-12-19 DIAGNOSIS — L601 Onycholysis: Secondary | ICD-10-CM

## 2020-12-19 NOTE — Progress Notes (Signed)
   Subjective: 85 y.o. female presents today status post permanent nail avulsion procedure of the left hallux nail plate that was performed on 11/28/2020.  Patient states she is doing well.  She was soaking her feet and applying antibiotic cream as instructed.  Overall she is very satisfied.  No new complaints at this time.   Past Medical History:  Diagnosis Date  . Allergy   . Anxiety   . Arthritis   . Cancer ([place])    skin  . Depression   . GERD (gastroesophageal reflux disease)   . Hypertension   . Migraines     Objective: Skin is warm, dry and supple. Nail bed and respective nail fold appears to be healing appropriately. Open wound to the associated nail fold with a granular wound base and moderate amount of fibrotic tissue. Minimal drainage noted.  Minimal erythema around the periungual region likely due to phenol chemical matricectomy.  Assessment: #1 postop permanent total nail avulsion left hallux nail plate   Plan of care: #1 patient was evaluated  #2  Light debridement of open wound was performed to the periungual border of the respective toe using a currette. Antibiotic ointment and Band-Aid was applied. #3 patient is to return to clinic on a PRN basis.   Edrick Kins, DPM [facility]  Dr. Edrick Kins, DPM    [address], [place] [address]                Office [phone]  Fax [phone]

## 2021-01-02 DIAGNOSIS — M9903 Segmental and somatic dysfunction of lumbar region: Secondary | ICD-10-CM | POA: Diagnosis not present

## 2021-01-02 DIAGNOSIS — M5412 Radiculopathy, cervical region: Secondary | ICD-10-CM | POA: Diagnosis not present

## 2021-01-02 DIAGNOSIS — M9901 Segmental and somatic dysfunction of cervical region: Secondary | ICD-10-CM | POA: Diagnosis not present

## 2021-01-02 DIAGNOSIS — M4306 Spondylolysis, lumbar region: Secondary | ICD-10-CM | POA: Diagnosis not present

## 2021-01-23 ENCOUNTER — Other Ambulatory Visit: Payer: Self-pay

## 2021-01-23 ENCOUNTER — Encounter: Payer: Self-pay | Admitting: Internal Medicine

## 2021-01-23 ENCOUNTER — Ambulatory Visit (INDEPENDENT_AMBULATORY_CARE_PROVIDER_SITE_OTHER): Payer: PPO | Admitting: Internal Medicine

## 2021-01-23 VITALS — BP 108/76 | HR 60 | Temp 98.2°F | Resp 16 | Ht 67.5 in | Wt 125.4 lb

## 2021-01-23 DIAGNOSIS — G629 Polyneuropathy, unspecified: Secondary | ICD-10-CM

## 2021-01-23 DIAGNOSIS — I9589 Other hypotension: Secondary | ICD-10-CM

## 2021-01-23 DIAGNOSIS — W540XXA Bitten by dog, initial encounter: Secondary | ICD-10-CM

## 2021-01-23 DIAGNOSIS — S81852A Open bite, left lower leg, initial encounter: Secondary | ICD-10-CM

## 2021-01-23 DIAGNOSIS — M4306 Spondylolysis, lumbar region: Secondary | ICD-10-CM | POA: Diagnosis not present

## 2021-01-23 DIAGNOSIS — M9903 Segmental and somatic dysfunction of lumbar region: Secondary | ICD-10-CM | POA: Diagnosis not present

## 2021-01-23 DIAGNOSIS — M9901 Segmental and somatic dysfunction of cervical region: Secondary | ICD-10-CM | POA: Diagnosis not present

## 2021-01-23 DIAGNOSIS — M5412 Radiculopathy, cervical region: Secondary | ICD-10-CM | POA: Diagnosis not present

## 2021-01-23 MED ORDER — MUPIROCIN 2 % EX OINT: 1.0000 "application " | TOPICAL_OINTMENT | Freq: Two times a day (BID) | CUTANEOUS | 0 refills | Status: DC

## 2021-01-23 MED ORDER — CEPHALEXIN 500 MG PO CAPS: ORAL_CAPSULE | ORAL | 0 refills | Status: DC

## 2021-01-23 MED ORDER — MUPIROCIN CALCIUM 2 % EX CREA: 1.0000 "application " | TOPICAL_CREAM | Freq: Two times a day (BID) | CUTANEOUS | 1 refills | Status: DC

## 2021-01-23 NOTE — Telephone Encounter (Signed)
Spoke with phar and change to mupirocin ointment

## 2021-01-23 NOTE — Progress Notes (Signed)
[facility] [place], [place] [address]  Internal MEDICINE  Office Visit Note  Patient Name: Sandra Davidson  [id number]  [id number]  Date of Service: 01/29/2021  Chief Complaint  Patient presents with  . leg infection    Left leg.  Dog scratched leg     HPI Pt is here for a sick visit. She was scratched by her dog accidentally when  got scarred . She felt dizzy and lost balance as well. The area has been painful and now red. She is concerned about infection, her dog s fully vaccinated. Denies any fever or chills Pt's BP is low, she is on spirolactone and Lotensin. She was instructed to stop Aldactone however concerned about leg swelling. She did cut down her Lotensin in half, c/o numbness in her hands and feet. Gabapentin and requip do not seem to help   Denies any sob or c/p    Current Medication:  Outpatient Encounter Medications as of 01/23/2021  Medication Sig  . ALPRAZolam (XANAX) 0.5 MG tablet Take 1 tablet po QD prn  . cephALEXin (KEFLEX) 500 MG capsule One tab po bid with food  . diclofenac Sodium (VOLTAREN) 1 % GEL Apply 4 g topically 4 (four) times daily.  Marland Kitchen gabapentin (NEURONTIN) 100 MG capsule Take 1 to 2 capsules po QPM for RLS  . gentamicin cream (GARAMYCIN) 0.1 % Apply 1 application topically 2 (two) times daily.  Marland Kitchen ibuprofen (ADVIL) 600 MG tablet Take 1 tablet (600 mg total) by mouth every 8 (eight) hours as needed.  . meclizine (ANTIVERT) 25 MG tablet Take 1 tablet (25 mg total) by mouth 3 (three) times daily as needed for dizziness.  . nitrofurantoin (MACRODANTIN) 100 MG capsule Take 100 mg by mouth every other day.  Marland Kitchen rOPINIRole (REQUIP) 1 MG tablet Take 1 to 2 tablets po QHS prn restless legs .  . spironolactone (ALDACTONE) 25 MG tablet Take 1 tablet (25 mg total) by mouth daily.  . [DISCONTINUED] benazepril (LOTENSIN) 20 MG tablet Take 1 tablet (20 mg total) by mouth daily.  . [DISCONTINUED] mupirocin cream (BACTROBAN) 2 % Apply 1  application topically 2 (two) times daily.   No facility-administered encounter medications on file as of 01/23/2021.      Medical History: Past Medical History:  Diagnosis Date  . Allergy   . Anxiety   . Arthritis   . Cancer ([place])    skin  . Depression   . GERD (gastroesophageal reflux disease)   . Hypertension   . Migraines      Vital Signs: BP 108/76   Pulse 60   Temp 98.2 F (36.8 C)   Resp 16   Ht 5' 7.5" (1.715 m)   Wt 125 lb 6.4 oz (56.9 kg)   SpO2 96%   BMI 19.35 kg/m    Review of Systems  Constitutional: Negative for chills, diaphoresis and fatigue.  HENT: Negative for ear pain, postnasal drip and sinus pressure.   Eyes: Negative for photophobia, discharge, redness, itching and visual disturbance.  Respiratory: Negative for cough, shortness of breath and wheezing.   Cardiovascular: Negative for chest pain, palpitations and leg swelling.  Gastrointestinal: Negative for abdominal pain, constipation, diarrhea, nausea and vomiting.  Genitourinary: Negative for dysuria and flank pain.  Musculoskeletal: Negative for arthralgias, back pain, gait problem and neck pain.  Skin: Positive for rash and wound. Negative for color change.  Allergic/Immunologic: Negative for environmental allergies and food allergies.  Neurological: Negative for dizziness and headaches.  Hematological: Does not bruise/bleed easily.  Psychiatric/Behavioral: Negative for agitation, behavioral problems (depression) and hallucinations.    Physical Exam Constitutional:      Appearance: Normal appearance.  HENT:     Head: Normocephalic and atraumatic.  Eyes:     Extraocular Movements: Extraocular movements intact.  Pulmonary:     Effort: Pulmonary effort is normal.     Breath sounds: Normal breath sounds.  Skin:    Findings: Erythema and rash present.     Comments: Irreguar borders, with well healing wound ( superficial)   Neurological:     Mental Status: She is alert.        Assessment/Plan: 1. Iatrogenic hypotension Ideally pt should stop both antihypertensives, since she is dizzy and lightheaded. She feels reluctant to stop her aldactone due to BLE. Instructed to wear compression socks and hold on meds   2. Neuropathy Ongoing problem, on requip and gabapentin, needs few diagnostics  - B12 and Folate Panel, iron IBC and Ferritin   3. Dog bite of left lower leg, initial encounter Area was cleaned and wrapped with Bactroban ointment  - cephALEXin (KEFLEX) 500 MG capsule; One tab po bid with food  Dispense: 10 capsule; Refill: 0  Keep area dry and clean. Watch for any increased in redness and swelling   General Counseling: Saniyyah verbalizes understanding of the findings of todays visit and agrees with plan of treatment. I have discussed any further diagnostic evaluation that may be needed or ordered today. We also reviewed her medications today. she has been encouraged to call the office with any questions or concerns that should arise related to todays visit.   Orders Placed This Encounter  Procedures  . B12 and Folate Panel  . Fe+TIBC+Fer    Meds ordered this encounter  Medications  . DISCONTD: mupirocin cream (BACTROBAN) 2 %    Sig: Apply 1 application topically 2 (two) times daily.    Dispense:  45 g    Refill:  1  . cephALEXin (KEFLEX) 500 MG capsule    Sig: One tab po bid with food    Dispense:  10 capsule    Refill:  0    [place] Controlled Substance Database was reviewed by me.  Time spent:25 Minutes

## 2021-01-29 ENCOUNTER — Telehealth: Payer: Self-pay

## 2021-01-29 DIAGNOSIS — G629 Polyneuropathy, unspecified: Secondary | ICD-10-CM | POA: Diagnosis not present

## 2021-01-29 NOTE — Telephone Encounter (Signed)
Spoke to pt and asked how she was doing, she advised she was doing well and that her leg wound was about the same.  It is getting a scab over the place and it still is raw and red looking but the redness is about the same as it was at office visit, and is not spreading.  At bottom of the wound it still is open and she thinks it is how she walks and it stretches it.  The area is not hot to the touch as it was at her office visit.  Over all she is doing better and still dressing the wound.  Her wound was done by her Yorkie dog but pt stated it wasn't on purpose that it was just how thin her skin is now.

## 2021-01-30 LAB — IRON,TIBC AND FERRITIN PANEL
Ferritin: 113 ng/mL (ref 15–150)
Iron Saturation: 30 % (ref 15–55)
Iron: 94 ug/dL (ref 27–139)
Total Iron Binding Capacity: 318 ug/dL (ref 250–450)
UIBC: 224 ug/dL (ref 118–369)

## 2021-01-30 LAB — B12 AND FOLATE PANEL
Folate: 9.6 ng/mL (ref >3.0)
Vitamin B-12: 493 pg/mL (ref 232–1245)

## 2021-02-01 ENCOUNTER — Telehealth: Payer: Self-pay

## 2021-02-01 NOTE — Telephone Encounter (Signed)
-----   Message from Lavera Guise, MD sent at 01/29/2021  7:42 AM EST ----- Can u ask her something for me

## 2021-02-07 DIAGNOSIS — M4306 Spondylolysis, lumbar region: Secondary | ICD-10-CM | POA: Diagnosis not present

## 2021-02-07 DIAGNOSIS — M9903 Segmental and somatic dysfunction of lumbar region: Secondary | ICD-10-CM | POA: Diagnosis not present

## 2021-02-07 DIAGNOSIS — M9901 Segmental and somatic dysfunction of cervical region: Secondary | ICD-10-CM | POA: Diagnosis not present

## 2021-02-07 DIAGNOSIS — M5412 Radiculopathy, cervical region: Secondary | ICD-10-CM | POA: Diagnosis not present

## 2021-02-09 DIAGNOSIS — M4306 Spondylolysis, lumbar region: Secondary | ICD-10-CM | POA: Diagnosis not present

## 2021-02-09 DIAGNOSIS — M9903 Segmental and somatic dysfunction of lumbar region: Secondary | ICD-10-CM | POA: Diagnosis not present

## 2021-02-09 DIAGNOSIS — M9901 Segmental and somatic dysfunction of cervical region: Secondary | ICD-10-CM | POA: Diagnosis not present

## 2021-02-09 DIAGNOSIS — M5412 Radiculopathy, cervical region: Secondary | ICD-10-CM | POA: Diagnosis not present

## 2021-02-12 DIAGNOSIS — M9903 Segmental and somatic dysfunction of lumbar region: Secondary | ICD-10-CM | POA: Diagnosis not present

## 2021-02-12 DIAGNOSIS — M9901 Segmental and somatic dysfunction of cervical region: Secondary | ICD-10-CM | POA: Diagnosis not present

## 2021-02-12 DIAGNOSIS — M5412 Radiculopathy, cervical region: Secondary | ICD-10-CM | POA: Diagnosis not present

## 2021-02-12 DIAGNOSIS — M4306 Spondylolysis, lumbar region: Secondary | ICD-10-CM | POA: Diagnosis not present

## 2021-02-14 DIAGNOSIS — M9903 Segmental and somatic dysfunction of lumbar region: Secondary | ICD-10-CM | POA: Diagnosis not present

## 2021-02-14 DIAGNOSIS — M5412 Radiculopathy, cervical region: Secondary | ICD-10-CM | POA: Diagnosis not present

## 2021-02-14 DIAGNOSIS — M4306 Spondylolysis, lumbar region: Secondary | ICD-10-CM | POA: Diagnosis not present

## 2021-02-14 DIAGNOSIS — M9901 Segmental and somatic dysfunction of cervical region: Secondary | ICD-10-CM | POA: Diagnosis not present

## 2021-02-26 DIAGNOSIS — M5412 Radiculopathy, cervical region: Secondary | ICD-10-CM | POA: Diagnosis not present

## 2021-02-26 DIAGNOSIS — M9903 Segmental and somatic dysfunction of lumbar region: Secondary | ICD-10-CM | POA: Diagnosis not present

## 2021-02-26 DIAGNOSIS — M4306 Spondylolysis, lumbar region: Secondary | ICD-10-CM | POA: Diagnosis not present

## 2021-02-26 DIAGNOSIS — M9901 Segmental and somatic dysfunction of cervical region: Secondary | ICD-10-CM | POA: Diagnosis not present

## 2021-03-12 DIAGNOSIS — M5412 Radiculopathy, cervical region: Secondary | ICD-10-CM | POA: Diagnosis not present

## 2021-03-12 DIAGNOSIS — M9901 Segmental and somatic dysfunction of cervical region: Secondary | ICD-10-CM | POA: Diagnosis not present

## 2021-03-12 DIAGNOSIS — M9903 Segmental and somatic dysfunction of lumbar region: Secondary | ICD-10-CM | POA: Diagnosis not present

## 2021-03-12 DIAGNOSIS — M4306 Spondylolysis, lumbar region: Secondary | ICD-10-CM | POA: Diagnosis not present

## 2021-03-13 DIAGNOSIS — M170 Bilateral primary osteoarthritis of knee: Secondary | ICD-10-CM | POA: Diagnosis not present

## 2021-03-20 DIAGNOSIS — G5601 Carpal tunnel syndrome, right upper limb: Secondary | ICD-10-CM | POA: Diagnosis not present

## 2021-03-21 ENCOUNTER — Encounter: Payer: Self-pay | Admitting: Hospice and Palliative Medicine

## 2021-03-21 ENCOUNTER — Ambulatory Visit (INDEPENDENT_AMBULATORY_CARE_PROVIDER_SITE_OTHER): Payer: PPO | Admitting: Hospice and Palliative Medicine

## 2021-03-21 ENCOUNTER — Other Ambulatory Visit: Payer: Self-pay

## 2021-03-21 VITALS — BP 156/70 | HR 73 | Temp 97.3°F | Resp 16 | Ht 67.5 in | Wt 123.4 lb

## 2021-03-21 DIAGNOSIS — Z9109 Other allergy status, other than to drugs and biological substances: Secondary | ICD-10-CM

## 2021-03-21 DIAGNOSIS — G629 Polyneuropathy, unspecified: Secondary | ICD-10-CM | POA: Diagnosis not present

## 2021-03-21 DIAGNOSIS — Z79899 Other long term (current) drug therapy: Secondary | ICD-10-CM | POA: Diagnosis not present

## 2021-03-21 DIAGNOSIS — I10 Essential (primary) hypertension: Secondary | ICD-10-CM

## 2021-03-21 DIAGNOSIS — F411 Generalized anxiety disorder: Secondary | ICD-10-CM | POA: Diagnosis not present

## 2021-03-21 DIAGNOSIS — M5417 Radiculopathy, lumbosacral region: Secondary | ICD-10-CM

## 2021-03-21 DIAGNOSIS — G2581 Restless legs syndrome: Secondary | ICD-10-CM

## 2021-03-21 LAB — POCT URINE DRUG SCREEN
Methylenedioxyamphetamine: NOT DETECTED
POC Amphetamine UR: NOT DETECTED
POC BENZODIAZEPINES UR: POSITIVE — AB
POC Barbiturate UR: NOT DETECTED
POC Cocaine UR: NOT DETECTED
POC Ecstasy UR: NOT DETECTED
POC Marijuana UR: NOT DETECTED
POC Methadone UR: NOT DETECTED
POC Methamphetamine UR: NOT DETECTED
POC Opiate Ur: NOT DETECTED
POC Oxycodone UR: NOT DETECTED
POC PHENCYCLIDINE UR: NOT DETECTED
POC TRICYCLICS UR: NOT DETECTED

## 2021-03-21 MED ORDER — ROPINIROLE HCL 1 MG PO TABS
ORAL_TABLET | ORAL | 3 refills | Status: DC
Start: 2021-03-21 — End: 2021-07-20

## 2021-03-21 MED ORDER — GABAPENTIN 100 MG PO CAPS: ORAL_CAPSULE | ORAL | 3 refills | Status: DC

## 2021-03-21 MED ORDER — FLOVENT HFA 110 MCG/ACT IN AERO: 2.0000 | INHALATION_SPRAY | Freq: Two times a day (BID) | RESPIRATORY_TRACT | 2 refills | Status: DC

## 2021-03-21 MED ORDER — SPIRONOLACTONE 25 MG PO TABS: 25.0000 mg | ORAL_TABLET | Freq: Every day | ORAL | 3 refills | Status: DC

## 2021-03-21 MED ORDER — ALPRAZOLAM 0.5 MG PO TABS: ORAL_TABLET | ORAL | 3 refills | Status: DC
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
## 2021-03-26 ENCOUNTER — Encounter: Payer: Self-pay | Admitting: Hospice and Palliative Medicine

## 2021-03-27 DIAGNOSIS — M5412 Radiculopathy, cervical region: Secondary | ICD-10-CM | POA: Diagnosis not present

## 2021-03-27 DIAGNOSIS — M4306 Spondylolysis, lumbar region: Secondary | ICD-10-CM | POA: Diagnosis not present

## 2021-03-27 DIAGNOSIS — M9903 Segmental and somatic dysfunction of lumbar region: Secondary | ICD-10-CM | POA: Diagnosis not present

## 2021-03-27 DIAGNOSIS — M9901 Segmental and somatic dysfunction of cervical region: Secondary | ICD-10-CM | POA: Diagnosis not present

## 2021-04-05 DIAGNOSIS — G5601 Carpal tunnel syndrome, right upper limb: Secondary | ICD-10-CM | POA: Diagnosis not present

## 2021-04-05 DIAGNOSIS — G5621 Lesion of ulnar nerve, right upper limb: Secondary | ICD-10-CM | POA: Diagnosis not present

## 2021-04-10 DIAGNOSIS — G5601 Carpal tunnel syndrome, right upper limb: Secondary | ICD-10-CM | POA: Diagnosis not present

## 2021-04-18 ENCOUNTER — Other Ambulatory Visit: Payer: Self-pay

## 2021-04-18 ENCOUNTER — Ambulatory Visit (INDEPENDENT_AMBULATORY_CARE_PROVIDER_SITE_OTHER): Payer: PPO | Admitting: Internal Medicine

## 2021-04-18 ENCOUNTER — Encounter: Payer: Self-pay | Admitting: Nurse Practitioner

## 2021-04-18 VITALS — BP 128/70 | HR 57 | Temp 97.9°F | Resp 16 | Ht 67.5 in | Wt 121.2 lb

## 2021-04-18 DIAGNOSIS — G2581 Restless legs syndrome: Secondary | ICD-10-CM | POA: Diagnosis not present

## 2021-04-18 DIAGNOSIS — E2839 Other primary ovarian failure: Secondary | ICD-10-CM | POA: Diagnosis not present

## 2021-04-18 DIAGNOSIS — R233 Spontaneous ecchymoses: Secondary | ICD-10-CM

## 2021-04-18 DIAGNOSIS — R238 Other skin changes: Secondary | ICD-10-CM | POA: Diagnosis not present

## 2021-04-18 DIAGNOSIS — I10 Essential (primary) hypertension: Secondary | ICD-10-CM

## 2021-04-18 DIAGNOSIS — J449 Chronic obstructive pulmonary disease, unspecified: Secondary | ICD-10-CM | POA: Diagnosis not present

## 2021-04-18 MED ORDER — BENAZEPRIL HCL 5 MG PO TABS: 5.0000 mg | ORAL_TABLET | Freq: Every day | ORAL | 1 refills | Status: DC

## 2021-04-18 NOTE — Progress Notes (Signed)
[facility] [place], [place] [address]  Internal MEDICINE  Office Visit Note  Patient Name: Sandra Davidson  [id number]  [id number]  Date of Service: 04/18/2021  Chief Complaint  Patient presents with  . Follow-up    Blood pressure  . Depression  . Hypertension  . Gastroesophageal Reflux    HPI Pt is here for routine follow up BP is better, she is on Lotensin 5 mg once a Day, Lotensin has been titrated down from 20 mg to 5mg  once a day.  This seems to control her blood pressure very well Patient could not take spironolactone even though it does cause some dizziness, she thinks her lower extremity edema gets really worse if she does not take this medication She continues to take Xanax as needed for anxiety disorder Gabapentin remarkable for RLS/neuropathy  per patient She is pretty much up-to-date on preventive health maintenance except bone mineral density Her asthma COPD is under control on Flovent, she is a former smoker last chest x-ray on file is from 2019 showed right upper lung nodular density seen on the previous study not readily appreciated on the current study hyperextension with chronic interstitial changes suggestive Emphysema, will need follow-up study Patient has bruising of the right wrist she has right carpal tunnel and right cubital tunnel syndrome  Current Medication: Outpatient Encounter Medications as of 04/18/2021  Medication Sig  . ALPRAZolam (XANAX) 0.5 MG tablet Take 1 tablet po QD prn  . benazepril (LOTENSIN) 5 MG tablet Take 1 tablet (5 mg total) by mouth daily.  . diclofenac Sodium (VOLTAREN) 1 % GEL Apply 4 g topically 4 (four) times daily.  . fluticasone (FLOVENT HFA) 110 MCG/ACT inhaler Inhale 2 puffs into the lungs in the morning and at bedtime.  . gabapentin (NEURONTIN) 100 MG capsule Take 1 to 2 capsules po QPM for RLS  . gentamicin cream (GARAMYCIN) 0.1 % Apply 1 application topically 2 (two) times daily.  Marland Kitchen ibuprofen  (ADVIL) 600 MG tablet Take 1 tablet (600 mg total) by mouth every 8 (eight) hours as needed.  . meclizine (ANTIVERT) 25 MG tablet Take 1 tablet (25 mg total) by mouth 3 (three) times daily as needed for dizziness.  . mupirocin ointment (BACTROBAN) 2 % Apply 1 application topically 2 (two) times daily.  . nitrofurantoin (MACRODANTIN) 100 MG capsule Take 100 mg by mouth every other day.  Marland Kitchen rOPINIRole (REQUIP) 1 MG tablet Take 1 to 2 tablets po QHS prn restless legs .  . spironolactone (ALDACTONE) 25 MG tablet Take 1 tablet (25 mg total) by mouth daily.   No facility-administered encounter medications on file as of 04/18/2021.    Surgical History: Past Surgical History:  Procedure Laterality Date  . BACK SURGERY    . MOHS SURGERY    . NECK SURGERY    . skin cancer removed  12/14/2020  . TONSILLECTOMY      Medical History: Past Medical History:  Diagnosis Date  . Allergy   . Anxiety   . Arthritis   . Cancer ([place])    skin  . Depression   . GERD (gastroesophageal reflux disease)   . Hypertension   . Migraines     Family History: Family History  Problem Relation Age of Onset  . Congestive Heart Failure Mother   . COPD Mother   . Emphysema Mother   . Hypertension Mother   . Kidney disease Mother   . Cancer Father   . Kidney disease Father   .  Bladder Cancer Neg Hx     Social History   Socioeconomic History  . Marital status: Divorced    Spouse name: Not on file  . Number of children: Not on file  . Years of education: Not on file  . Highest education level: Not on file  Occupational History  . Not on file  Tobacco Use  . Smoking status: Former Smoker    Types: Cigarettes  . Smokeless tobacco: Never Used  Vaping Use  . Vaping Use: Every day  Substance and Sexual Activity  . Alcohol use: No  . Drug use: No  . Sexual activity: Not Currently  Other Topics Concern  . Not on file  Social History Narrative  . Not on file   Social Determinants of Health    Financial Resource Strain: Not on file  Food Insecurity: Not on file  Transportation Needs: Not on file  Physical Activity: Not on file  Stress: Not on file  Social Connections: Not on file  Intimate Partner Violence: Not on file      Review of Systems  Constitutional: Negative for chills, diaphoresis and fatigue.  HENT: Negative for ear pain, postnasal drip and sinus pressure.   Eyes: Negative for photophobia, discharge, redness, itching and visual disturbance.  Respiratory: Negative for cough, shortness of breath and wheezing.   Cardiovascular: Negative for chest pain, palpitations and leg swelling.  Gastrointestinal: Negative for abdominal pain, constipation, diarrhea, nausea and vomiting.  Genitourinary: Negative for dysuria and flank pain.  Musculoskeletal: Positive for joint swelling. Negative for arthralgias, back pain, gait problem and neck pain.  Skin: Negative for color change.  Allergic/Immunologic: Negative for environmental allergies and food allergies.  Neurological: Negative for dizziness and headaches.  Hematological: Bruises/bleeds easily.  Psychiatric/Behavioral: Negative for agitation, behavioral problems (depression) and hallucinations.    Vital Signs: BP 128/70   Pulse (!) 57   Temp 97.9 F (36.6 C)   Resp 16   Ht 5' 7.5" (1.715 m)   Wt 121 lb 3.2 oz (55 kg)   SpO2 95%   BMI 18.70 kg/m    Physical Exam Constitutional:      Appearance: Normal appearance.  HENT:     Head: Normocephalic and atraumatic.     Nose: Rhinorrhea present.  Eyes:     Extraocular Movements: Extraocular movements intact.     Pupils: Pupils are equal, round, and reactive to light.  Cardiovascular:     Rate and Rhythm: Normal rate and regular rhythm.  Pulmonary:     Effort: Pulmonary effort is normal.     Breath sounds: Normal breath sounds.  Abdominal:     General: Abdomen is flat.     Palpations: Abdomen is soft.  Musculoskeletal:        General: Swelling and  tenderness present.  Skin:    General: Skin is warm.     Findings: Bruising present.  Neurological:     General: No focal deficit present.     Mental Status: She is alert and oriented to person, place, and time.     Comments: Restless legs   Psychiatric:        Mood and Affect: Mood normal.        Behavior: Behavior normal.        Assessment/Plan: 1. Essential hypertension Her blood pressure is under good control we will continue Lotensin 5 mg once a day and a prescription is given and will continue spironolactone 25 mg once a day.  She was instructed to  hold on her dose if  systolic blood pressure is below 110  2. Restless leg syndrome, controlled Her symptoms of neuropathy and restless legs are well controlled with gabapentin she is followed by neurology  3. Easy bruisability Patient has bruising on the wrist she is supposed to have carpal tunnel release done in the next few days  4. Other primary ovarian failure Patient had a bone density 5 years ago however she needs a repeat study due to ongoing problems - DG Bone Density; Future  5. Chronic obstructive pulmonary disease, unspecified COPD type ([place]) Previous chest x-ray in 2019 did show evidence of COPD, might need PFTs and CT chest due to history of stroke  General Counseling: Salley verbalizes understanding of the findings of todays visit and agrees with plan of treatment. she has been encouraged to call the office with any questions or concerns that should arise related to todays visit.    Orders Placed This Encounter  Procedures  . DG Bone Density    Meds ordered this encounter  Medications  . benazepril (LOTENSIN) 5 MG tablet    Sig: Take 1 tablet (5 mg total) by mouth daily.    Dispense:  90 tablet    Refill:  1    Total time spent:35 Minutes Time spent includes review of chart, medications, test results, and follow up plan with the patient.   [place] Controlled Substance Database was reviewed by me.   Dr  Lavera Guise Internal medicine

## 2021-04-19 DIAGNOSIS — R278 Other lack of coordination: Secondary | ICD-10-CM | POA: Diagnosis not present

## 2021-04-19 DIAGNOSIS — R208 Other disturbances of skin sensation: Secondary | ICD-10-CM | POA: Diagnosis not present

## 2021-04-19 DIAGNOSIS — R202 Paresthesia of skin: Secondary | ICD-10-CM | POA: Diagnosis not present

## 2021-04-19 DIAGNOSIS — R200 Anesthesia of skin: Secondary | ICD-10-CM | POA: Diagnosis not present

## 2021-04-19 DIAGNOSIS — G629 Polyneuropathy, unspecified: Secondary | ICD-10-CM | POA: Diagnosis not present

## 2021-04-20 DIAGNOSIS — M25531 Pain in right wrist: Secondary | ICD-10-CM | POA: Diagnosis not present

## 2021-04-20 DIAGNOSIS — F419 Anxiety disorder, unspecified: Secondary | ICD-10-CM | POA: Diagnosis not present

## 2021-04-20 DIAGNOSIS — Z85828 Personal history of other malignant neoplasm of skin: Secondary | ICD-10-CM | POA: Diagnosis not present

## 2021-04-20 DIAGNOSIS — G2581 Restless legs syndrome: Secondary | ICD-10-CM | POA: Diagnosis not present

## 2021-04-20 DIAGNOSIS — G5601 Carpal tunnel syndrome, right upper limb: Secondary | ICD-10-CM | POA: Diagnosis not present

## 2021-04-20 DIAGNOSIS — G5621 Lesion of ulnar nerve, right upper limb: Secondary | ICD-10-CM | POA: Diagnosis not present

## 2021-04-20 DIAGNOSIS — F32A Depression, unspecified: Secondary | ICD-10-CM | POA: Diagnosis not present

## 2021-04-20 DIAGNOSIS — K219 Gastro-esophageal reflux disease without esophagitis: Secondary | ICD-10-CM | POA: Diagnosis not present

## 2021-04-20 DIAGNOSIS — G43909 Migraine, unspecified, not intractable, without status migrainosus: Secondary | ICD-10-CM | POA: Diagnosis not present

## 2021-04-20 DIAGNOSIS — G8918 Other acute postprocedural pain: Secondary | ICD-10-CM | POA: Diagnosis not present

## 2021-04-20 DIAGNOSIS — E2839 Other primary ovarian failure: Secondary | ICD-10-CM | POA: Diagnosis not present

## 2021-04-20 DIAGNOSIS — I10 Essential (primary) hypertension: Secondary | ICD-10-CM | POA: Diagnosis not present

## 2021-05-15 ENCOUNTER — Telehealth: Payer: Self-pay | Admitting: Internal Medicine

## 2021-05-15 NOTE — Chronic Care Management (AMB) (Signed)
  Chronic Care Management   Outreach Note  05/15/2021 Name: Sandra Davidson MRN: [id number] DOB: 1936-08-25  Referred by: Lavera Guise, MD Reason for referral : No chief complaint on file.   An unsuccessful telephone outreach was attempted today. The patient was referred to the pharmacist for assistance with care management and care coordination.   Follow Up Plan:   Tatjana Dellinger Upstream Scheduler

## 2021-05-15 NOTE — Progress Notes (Signed)
  Chronic Care Management   Note  05/15/2021 Name: Sandra Davidson MRN: [id number] DOB: 10-01-1936  [name] is a 85 y.o. year old female who is a primary care patient of Lavera Guise, MD. I reached out to [employer] by phone today in response to a referral sent by [name]'s PCP, Lavera Guise, MD.   [name] was given information about Chronic Care Management services today including:  1. CCM service includes personalized support from designated clinical staff supervised by her physician, including individualized plan of care and coordination with other care providers 2. 24/7 contact phone numbers for assistance for urgent and routine care needs. 3. Service will only be billed when office clinical staff spend 20 minutes or more in a month to coordinate care. 4. Only one practitioner may furnish and bill the service in a calendar month. 5. The patient may stop CCM services at any time (effective at the end of the month) by phone call to the office staff.   Patient wishes to consider information provided and/or speak with a member of the care team before deciding about enrollment in care management services.   Follow up plan:   [name]

## 2021-06-04 ENCOUNTER — Other Ambulatory Visit: Payer: Self-pay

## 2021-06-04 DIAGNOSIS — M4306 Spondylolysis, lumbar region: Secondary | ICD-10-CM | POA: Diagnosis not present

## 2021-06-04 DIAGNOSIS — M5412 Radiculopathy, cervical region: Secondary | ICD-10-CM | POA: Diagnosis not present

## 2021-06-04 DIAGNOSIS — M9903 Segmental and somatic dysfunction of lumbar region: Secondary | ICD-10-CM | POA: Diagnosis not present

## 2021-06-04 DIAGNOSIS — M9901 Segmental and somatic dysfunction of cervical region: Secondary | ICD-10-CM | POA: Diagnosis not present

## 2021-06-04 MED ORDER — NITROFURANTOIN MACROCRYSTAL 100 MG PO CAPS: 100.0000 mg | ORAL_CAPSULE | ORAL | 2 refills | Status: DC

## 2021-06-04 NOTE — Telephone Encounter (Signed)
Pt called that she need refills nitrofurantoin 100 mg  every other day as per dr Sandra Davidson send pres

## 2021-06-06 DIAGNOSIS — M9903 Segmental and somatic dysfunction of lumbar region: Secondary | ICD-10-CM | POA: Diagnosis not present

## 2021-06-06 DIAGNOSIS — M4306 Spondylolysis, lumbar region: Secondary | ICD-10-CM | POA: Diagnosis not present

## 2021-06-06 DIAGNOSIS — M5412 Radiculopathy, cervical region: Secondary | ICD-10-CM | POA: Diagnosis not present

## 2021-06-06 DIAGNOSIS — M9901 Segmental and somatic dysfunction of cervical region: Secondary | ICD-10-CM | POA: Diagnosis not present

## 2021-06-08 DIAGNOSIS — M9901 Segmental and somatic dysfunction of cervical region: Secondary | ICD-10-CM | POA: Diagnosis not present

## 2021-06-08 DIAGNOSIS — M9903 Segmental and somatic dysfunction of lumbar region: Secondary | ICD-10-CM | POA: Diagnosis not present

## 2021-06-08 DIAGNOSIS — M5412 Radiculopathy, cervical region: Secondary | ICD-10-CM | POA: Diagnosis not present

## 2021-06-08 DIAGNOSIS — M4306 Spondylolysis, lumbar region: Secondary | ICD-10-CM | POA: Diagnosis not present

## 2021-06-12 DIAGNOSIS — M9903 Segmental and somatic dysfunction of lumbar region: Secondary | ICD-10-CM | POA: Diagnosis not present

## 2021-06-12 DIAGNOSIS — M4306 Spondylolysis, lumbar region: Secondary | ICD-10-CM | POA: Diagnosis not present

## 2021-06-12 DIAGNOSIS — M9901 Segmental and somatic dysfunction of cervical region: Secondary | ICD-10-CM | POA: Diagnosis not present

## 2021-06-12 DIAGNOSIS — M5412 Radiculopathy, cervical region: Secondary | ICD-10-CM | POA: Diagnosis not present

## 2021-06-15 DIAGNOSIS — M9903 Segmental and somatic dysfunction of lumbar region: Secondary | ICD-10-CM | POA: Diagnosis not present

## 2021-06-15 DIAGNOSIS — M5412 Radiculopathy, cervical region: Secondary | ICD-10-CM | POA: Diagnosis not present

## 2021-06-15 DIAGNOSIS — M4306 Spondylolysis, lumbar region: Secondary | ICD-10-CM | POA: Diagnosis not present

## 2021-06-15 DIAGNOSIS — M9901 Segmental and somatic dysfunction of cervical region: Secondary | ICD-10-CM | POA: Diagnosis not present

## 2021-06-18 DIAGNOSIS — M5412 Radiculopathy, cervical region: Secondary | ICD-10-CM | POA: Diagnosis not present

## 2021-06-18 DIAGNOSIS — M4306 Spondylolysis, lumbar region: Secondary | ICD-10-CM | POA: Diagnosis not present

## 2021-06-18 DIAGNOSIS — M9901 Segmental and somatic dysfunction of cervical region: Secondary | ICD-10-CM | POA: Diagnosis not present

## 2021-06-18 DIAGNOSIS — M9903 Segmental and somatic dysfunction of lumbar region: Secondary | ICD-10-CM | POA: Diagnosis not present

## 2021-07-17 DIAGNOSIS — M19031 Primary osteoarthritis, right wrist: Secondary | ICD-10-CM | POA: Diagnosis not present

## 2021-07-18 ENCOUNTER — Telehealth: Payer: Self-pay

## 2021-07-18 NOTE — Telephone Encounter (Signed)
Patient dropped off a parking accomodation form to be completed by provider. She is requesting it be completed today. I did advise her I could not promise that it would be completed today,but I would try my very best. She does have an appointment 07-20-21 if unable to be completed by today.

## 2021-07-20 ENCOUNTER — Other Ambulatory Visit: Payer: Self-pay

## 2021-07-20 ENCOUNTER — Ambulatory Visit (INDEPENDENT_AMBULATORY_CARE_PROVIDER_SITE_OTHER): Payer: PPO | Admitting: Nurse Practitioner

## 2021-07-20 ENCOUNTER — Ambulatory Visit: Payer: PPO | Admitting: Hospice and Palliative Medicine

## 2021-07-20 ENCOUNTER — Encounter (INDEPENDENT_AMBULATORY_CARE_PROVIDER_SITE_OTHER): Payer: Self-pay

## 2021-07-20 ENCOUNTER — Encounter: Payer: Self-pay | Admitting: Nurse Practitioner

## 2021-07-20 VITALS — BP 100/64 | HR 78 | Temp 97.3°F | Resp 16 | Ht 67.5 in | Wt 115.8 lb

## 2021-07-20 DIAGNOSIS — Z87440 Personal history of urinary (tract) infections: Secondary | ICD-10-CM | POA: Diagnosis not present

## 2021-07-20 DIAGNOSIS — I10 Essential (primary) hypertension: Secondary | ICD-10-CM | POA: Diagnosis not present

## 2021-07-20 DIAGNOSIS — Z23 Encounter for immunization: Secondary | ICD-10-CM

## 2021-07-20 DIAGNOSIS — Z0001 Encounter for general adult medical examination with abnormal findings: Secondary | ICD-10-CM | POA: Diagnosis not present

## 2021-07-20 DIAGNOSIS — M5417 Radiculopathy, lumbosacral region: Secondary | ICD-10-CM

## 2021-07-20 DIAGNOSIS — G629 Polyneuropathy, unspecified: Secondary | ICD-10-CM | POA: Diagnosis not present

## 2021-07-20 DIAGNOSIS — F411 Generalized anxiety disorder: Secondary | ICD-10-CM | POA: Diagnosis not present

## 2021-07-20 DIAGNOSIS — R300 Dysuria: Secondary | ICD-10-CM | POA: Diagnosis not present

## 2021-07-20 MED ORDER — ETODOLAC 400 MG PO TABS: 400.0000 mg | ORAL_TABLET | Freq: Two times a day (BID) | ORAL | 2 refills | Status: DC | PRN

## 2021-07-20 MED ORDER — ROPINIROLE HCL 1 MG PO TABS: ORAL_TABLET | ORAL | 1 refills | Status: DC

## 2021-07-20 MED ORDER — BENAZEPRIL HCL 5 MG PO TABS: 5.0000 mg | ORAL_TABLET | Freq: Every day | ORAL | 1 refills | Status: DC

## 2021-07-20 MED ORDER — ZOSTER VAC RECOMB ADJUVANTED 50 MCG/0.5ML IM SUSR: 0.5000 mL | Freq: Once | INTRAMUSCULAR | 0 refills | Status: AC

## 2021-07-20 MED ORDER — GABAPENTIN 100 MG PO CAPS
ORAL_CAPSULE | ORAL | 1 refills | Status: DC
Start: 2021-07-20 — End: 2021-10-17

## 2021-07-20 MED ORDER — ALPRAZOLAM 0.5 MG PO TABS: ORAL_TABLET | ORAL | 2 refills | Status: DC

## 2021-07-20 MED ORDER — NITROFURANTOIN MACROCRYSTAL 100 MG PO CAPS: 100.0000 mg | ORAL_CAPSULE | ORAL | 1 refills | Status: DC

## 2021-07-20 MED ORDER — DICLOFENAC SODIUM 1 % EX GEL: 4.0000 g | Freq: Four times a day (QID) | CUTANEOUS | 2 refills | Status: DC

## 2021-07-20 NOTE — Progress Notes (Signed)
[facility], [address] [address]  Internal MEDICINE  Office Visit Note  Patient Name: Sandra Davidson  [id number]  [id number]  Date of Service: 07/20/2021  Chief Complaint  Patient presents with   Medicare Wellness    Paperwork, discuss meds, discuss taste and appetite    Gastroesophageal Reflux   Hypertension   Depression   Anxiety    HPI Samoria presents for an annual well visit and physical exam. she has a history of allergies, anxiety, arthritis, depression, GERD, hypertension, history of skin cancer, and migraines. She is planning on getting the last booster dose of the COVID vaccine. She has declined bone density scan. She does need her medications refilled. She is have neuropathy in her bilateral lower extremities. She is interested in receiving the shingles vaccine. She has no other concerns or questions today.      Current Medication: Outpatient Encounter Medications as of 07/20/2021  Medication Sig   benazepril (LOTENSIN) 5 MG tablet Take 1 tablet (5 mg total) by mouth daily.   etodolac (LODINE) 400 MG tablet Take 1 tablet (400 mg total) by mouth 2 (two) times daily as needed for mild pain or moderate pain.   fluticasone (FLOVENT HFA) 110 MCG/ACT inhaler Inhale 2 puffs into the lungs in the morning and at bedtime.   gentamicin cream (GARAMYCIN) 0.1 % Apply 1 application topically 2 (two) times daily.   ibuprofen (ADVIL) 600 MG tablet Take 1 tablet (600 mg total) by mouth every 8 (eight) hours as needed.   meclizine (ANTIVERT) 25 MG tablet Take 1 tablet (25 mg total) by mouth 3 (three) times daily as needed for dizziness.   mupirocin ointment (BACTROBAN) 2 % Apply 1 application topically 2 (two) times daily.   spironolactone (ALDACTONE) 25 MG tablet Take 1 tablet (25 mg total) by mouth daily.   [DISCONTINUED] ALPRAZolam (XANAX) 0.5 MG tablet Take 1 tablet po QD prn   [DISCONTINUED] benazepril (LOTENSIN) 5 MG tablet Take 1 tablet (5 mg total) by  mouth daily.   [DISCONTINUED] diclofenac Sodium (VOLTAREN) 1 % GEL Apply 4 g topically 4 (four) times daily.   [DISCONTINUED] gabapentin (NEURONTIN) 100 MG capsule Take 1 to 2 capsules po QPM for RLS   [DISCONTINUED] nitrofurantoin (MACRODANTIN) 100 MG capsule Take 1 capsule (100 mg total) by mouth every other day.   [DISCONTINUED] rOPINIRole (REQUIP) 1 MG tablet Take 1 to 2 tablets po QHS prn restless legs .   [DISCONTINUED] Zoster Vaccine Adjuvanted [facility]) injection Inject 0.5 mLs into the muscle once.   ALPRAZolam (XANAX) 0.5 MG tablet Take 1 tablet po QD prn   diclofenac Sodium (VOLTAREN) 1 % GEL Apply 4 g topically 4 (four) times daily.   gabapentin (NEURONTIN) 100 MG capsule Take 1 to 2 capsules po QPM for RLS   nitrofurantoin (MACRODANTIN) 100 MG capsule Take 1 capsule (100 mg total) by mouth every Monday, Wednesday, and Friday.   rOPINIRole (REQUIP) 1 MG tablet Take 1 tablet po QHS prn restless legs .   [EXPIRED] Zoster Vaccine Adjuvanted [facility]) injection Inject 0.5 mLs into the muscle once for 1 dose.   No facility-administered encounter medications on file as of 07/20/2021.    Surgical History: Past Surgical History:  Procedure Laterality Date   BACK SURGERY     MOHS SURGERY     NECK SURGERY     skin cancer removed  12/14/2020   TONSILLECTOMY     WRIST SURGERY Right     Medical History: Past  Medical History:  Diagnosis Date   Allergy    Anxiety    Arthritis    Cancer ([place])    skin   Depression    GERD (gastroesophageal reflux disease)    Hypertension    Migraines     Family History: Family History  Problem Relation Age of Onset   Congestive Heart Failure Mother    COPD Mother    Emphysema Mother    Hypertension Mother    Kidney disease Mother    Cancer Father    Kidney disease Father    Bladder Cancer Neg Hx     Social History   Socioeconomic History   Marital status: Divorced    Spouse name: Not on file   Number of children: Not on file    Years of education: Not on file   Highest education level: Not on file  Occupational History   Not on file  Tobacco Use   Smoking status: Former    Types: Cigarettes   Smokeless tobacco: Never  Vaping Use   Vaping Use: Every day  Substance and Sexual Activity   Alcohol use: No   Drug use: No   Sexual activity: Not Currently  Other Topics Concern   Not on file  Social History Narrative   Not on file   Social Determinants of Health   Financial Resource Strain: Not on file  Food Insecurity: Not on file  Transportation Needs: Not on file  Physical Activity: Not on file  Stress: Not on file  Social Connections: Not on file  Intimate Partner Violence: Not on file      Review of Systems  Constitutional:  Negative for activity change, appetite change, chills, fatigue, fever and unexpected weight change.  HENT: Negative.  Negative for congestion, ear pain, rhinorrhea, sore throat and trouble swallowing.   Eyes: Negative.   Respiratory: Negative.  Negative for cough, chest tightness, shortness of breath and wheezing.   Cardiovascular: Negative.  Negative for chest pain.  Gastrointestinal: Negative.  Negative for abdominal pain, blood in stool, constipation, diarrhea, nausea and vomiting.  Endocrine: Negative.   Genitourinary: Negative.  Negative for difficulty urinating, dysuria, frequency, hematuria and urgency.  Musculoskeletal: Negative.  Negative for arthralgias, back pain, joint swelling, myalgias and neck pain.  Skin: Negative.  Negative for rash and wound.  Allergic/Immunologic: Negative.  Negative for immunocompromised state.  Neurological: Negative.  Negative for dizziness, seizures, numbness and headaches.  Hematological: Negative.   Psychiatric/Behavioral: Negative.  Negative for behavioral problems, self-injury and suicidal ideas. The patient is not nervous/anxious.   Depression screen [facility] 2/9 07/20/2021 04/18/2021 03/21/2021 01/23/2021 11/20/2020  Decreased Interest 0 0  0 0 0  Down, Depressed, Hopeless 0 0 0 0 1  PHQ - 2 Score 0 0 0 0 1   MMSE - Mini Mental State Exam 07/20/2021 07/18/2020 06/28/2019 06/23/2018  Orientation to time '5 5 5 5  '$ Orientation to Place '5 5 5 5  '$ Registration '3 3 3 3  '$ Attention/ Calculation 0 '5 5 5  '$ Recall '3 3 3 3  '$ Language- name 2 objects '2 2 2 2  '$ Language- repeat '1 1 1 1  '$ Language- follow 3 step command '3 3 3 3  '$ Language- read & follow direction '1 1 1 1  '$ Write a sentence '1 1 1 1  '$ Copy design '1 1 1 1  '$ Total score '25 30 30 30   '$ Fall Risk  07/20/2021 04/18/2021 03/21/2021 01/23/2021 11/20/2020  Falls in the past year? 1 1  1 1 0  Number falls in past yr: 0 1 1 0 -  Injury with Fall? '1 1 1 1 '$ -  Comment - hurt knee and elbow - hit head, knee and elbow.  pt fainted when vomiting -  Risk for fall due to : - - - - No Fall Risks  Follow up Falls evaluation completed - Falls evaluation completed - Falls evaluation completed      Vital Signs: BP 100/64   Pulse 78   Temp (!) 97.3 F (36.3 C)   Resp 16   Ht 5' 7.5" (1.715 m)   Wt 115 lb 12.8 oz (52.5 kg)   SpO2 98%   BMI 17.87 kg/m   Physical Exam Vitals reviewed.  Constitutional:      General: She is awake. She is not in acute distress.    Appearance: Normal appearance. She is well-developed and well-groomed. She is not ill-appearing.  HENT:     Head: Normocephalic and atraumatic.     Right Ear: Hearing, tympanic membrane, ear canal and external ear normal.     Left Ear: Hearing, tympanic membrane, ear canal and external ear normal.     Nose: Nose normal. No congestion or rhinorrhea.     Mouth/Throat:     Lips: Pink.     Mouth: Mucous membranes are moist.     Pharynx: Oropharynx is clear. Uvula midline. No oropharyngeal exudate or posterior oropharyngeal erythema.  Eyes:     General: Lids are normal. Vision grossly intact. Gaze aligned appropriately.     Extraocular Movements: Extraocular movements intact.     Conjunctiva/sclera: Conjunctivae normal.     Pupils: Pupils  are equal, round, and reactive to light.     Funduscopic exam:    Right eye: Red reflex present.        Left eye: Red reflex present. Neck:     Vascular: No carotid bruit.     Trachea: Trachea and phonation normal.  Cardiovascular:     Rate and Rhythm: Normal rate and regular rhythm.     Pulses:          Carotid pulses are 3+ on the right side and 3+ on the left side.      Radial pulses are 2+ on the right side and 2+ on the left side.       Dorsalis pedis pulses are 2+ on the right side and 2+ on the left side.       Posterior tibial pulses are 2+ on the right side and 2+ on the left side.     Heart sounds: Normal heart sounds, S1 normal and S2 normal. No murmur heard.   No friction rub. No gallop.  Pulmonary:     Effort: Pulmonary effort is normal. No accessory muscle usage or respiratory distress.     Breath sounds: Normal breath sounds and air entry. No wheezing.  Chest:     Comments: Clinical  breast exam declined.  Abdominal:     General: Bowel sounds are normal. There is no distension.     Palpations: Abdomen is soft. There is no mass.     Tenderness: There is no abdominal tenderness. There is no guarding or rebound.     Hernia: No hernia is present.  Musculoskeletal:        General: No deformity or signs of injury.     Cervical back: Normal range of motion and neck supple.  Lymphadenopathy:     Cervical: No cervical adenopathy.  Skin:  General: Skin is warm and dry.     Capillary Refill: Capillary refill takes less than 2 seconds.     Coloration: Skin is not jaundiced.  Neurological:     Mental Status: She is alert and oriented to person, place, and time.  Psychiatric:        Mood and Affect: Mood normal.        Behavior: Behavior normal. Behavior is cooperative.        Thought Content: Thought content normal.        Judgment: Judgment normal.       Assessment/Plan: 1. Encounter for general adult medical examination with abnormal findings Age-appropriate  preventive screenings and vaccinations discussed, annual physical exam completed. Routine labs for health maintenance ordered. PHM updated.    2. Essential hypertension Stable with current medication, refill ordered.  - benazepril (LOTENSIN) 5 MG tablet; Take 1 tablet (5 mg total) by mouth daily.  Dispense: 90 tablet; Refill: 1  3. Lumbosacral radiculopathy due to intervertebral disc disorder Stable with current medications, refills ordered.  - etodolac (LODINE) 400 MG tablet; Take 1 tablet (400 mg total) by mouth 2 (two) times daily as needed for mild pain or moderate pain.  Dispense: 30 tablet; Refill: 2 - diclofenac Sodium (VOLTAREN) 1 % GEL; Apply 4 g topically 4 (four) times daily.  Dispense: 700 g; Refill: 2  4. Generalized anxiety disorder Stable, alprazolam refill ordered.  - ALPRAZolam (XANAX) 0.5 MG tablet; Take 1 tablet po QD prn  Dispense: 30 tablet; Refill: 2  5. Neuropathy Stable with current medications, refills ordered. - rOPINIRole (REQUIP) 1 MG tablet; Take 1 tablet po QHS prn restless legs .  Dispense: 90 tablet; Refill: 1 - gabapentin (NEURONTIN) 100 MG capsule; Take 1 to 2 capsules po QPM for RLS  Dispense: 180 capsule; Refill: 1  6. History of recurrent UTIs Takes macrobid for prophylaxis, clarified prescription to Monday, Wednesday and Friday.  - nitrofurantoin (MACRODANTIN) 100 MG capsule; Take 1 capsule (100 mg total) by mouth every Monday, Wednesday, and Friday.  Dispense: 45 capsule; Refill: 1  7. Need for vaccination Order sent to pharmacy - Zoster Vaccine Adjuvanted [facility]) injection; Inject 0.5 mLs into the muscle once for 1 dose.  Dispense: 0.5 mL; Refill: 0  8. Dysuria Routine urinalysis done - UA/M w/rflx Culture, Routine      General Counseling: Asley verbalizes understanding of the findings of todays visit and agrees with plan of treatment. I have discussed any further diagnostic evaluation that may be needed or ordered today. We also reviewed  her medications today. she has been encouraged to call the office with any questions or concerns that should arise related to todays visit.    Orders Placed This Encounter  Procedures   UA/M w/rflx Culture, Routine    Meds ordered this encounter  Medications   Zoster Vaccine Adjuvanted [facility]) injection    Sig: Inject 0.5 mLs into the muscle once for 1 dose.    Dispense:  0.5 mL    Refill:  0   benazepril (LOTENSIN) 5 MG tablet    Sig: Take 1 tablet (5 mg total) by mouth daily.    Dispense:  90 tablet    Refill:  1   etodolac (LODINE) 400 MG tablet    Sig: Take 1 tablet (400 mg total) by mouth 2 (two) times daily as needed for mild pain or moderate pain.    Dispense:  30 tablet    Refill:  2   rOPINIRole (REQUIP)  1 MG tablet    Sig: Take 1 tablet po QHS prn restless legs .    Dispense:  90 tablet    Refill:  1   gabapentin (NEURONTIN) 100 MG capsule    Sig: Take 1 to 2 capsules po QPM for RLS    Dispense:  180 capsule    Refill:  1   ALPRAZolam (XANAX) 0.5 MG tablet    Sig: Take 1 tablet po QD prn    Dispense:  30 tablet    Refill:  2   nitrofurantoin (MACRODANTIN) 100 MG capsule    Sig: Take 1 capsule (100 mg total) by mouth every Monday, Wednesday, and Friday.    Dispense:  45 capsule    Refill:  1   diclofenac Sodium (VOLTAREN) 1 % GEL    Sig: Apply 4 g topically 4 (four) times daily.    Dispense:  700 g    Refill:  2    Return in about 3 months (around 10/20/2021) for F/U, med refill, [place] PCP.   Total time spent:30 Minutes Time spent includes review of chart, medications, test results, and follow up plan with the patient.   [place] Controlled Substance Database was reviewed by me. ORS 160.   This patient was seen by Jonetta Osgood, FNP-C in collaboration with Dr. Clayborn Bigness as a part of collaborative care agreement.  Thelton Graca R. Valetta Fuller, MSN, FNP-C Internal medicine

## 2021-07-21 LAB — UA/M W/RFLX CULTURE, ROUTINE

## 2021-07-27 DIAGNOSIS — M170 Bilateral primary osteoarthritis of knee: Secondary | ICD-10-CM | POA: Diagnosis not present

## 2021-07-30 DIAGNOSIS — M9901 Segmental and somatic dysfunction of cervical region: Secondary | ICD-10-CM | POA: Diagnosis not present

## 2021-07-30 DIAGNOSIS — M4306 Spondylolysis, lumbar region: Secondary | ICD-10-CM | POA: Diagnosis not present

## 2021-07-30 DIAGNOSIS — M9903 Segmental and somatic dysfunction of lumbar region: Secondary | ICD-10-CM | POA: Diagnosis not present

## 2021-07-30 DIAGNOSIS — M5412 Radiculopathy, cervical region: Secondary | ICD-10-CM | POA: Diagnosis not present

## 2021-08-27 DIAGNOSIS — M4306 Spondylolysis, lumbar region: Secondary | ICD-10-CM | POA: Diagnosis not present

## 2021-08-27 DIAGNOSIS — M9901 Segmental and somatic dysfunction of cervical region: Secondary | ICD-10-CM | POA: Diagnosis not present

## 2021-08-27 DIAGNOSIS — M5412 Radiculopathy, cervical region: Secondary | ICD-10-CM | POA: Diagnosis not present

## 2021-08-27 DIAGNOSIS — M9903 Segmental and somatic dysfunction of lumbar region: Secondary | ICD-10-CM | POA: Diagnosis not present

## 2021-09-24 DIAGNOSIS — M9903 Segmental and somatic dysfunction of lumbar region: Secondary | ICD-10-CM | POA: Diagnosis not present

## 2021-09-24 DIAGNOSIS — M4306 Spondylolysis, lumbar region: Secondary | ICD-10-CM | POA: Diagnosis not present

## 2021-09-24 DIAGNOSIS — M5412 Radiculopathy, cervical region: Secondary | ICD-10-CM | POA: Diagnosis not present

## 2021-09-24 DIAGNOSIS — M9901 Segmental and somatic dysfunction of cervical region: Secondary | ICD-10-CM | POA: Diagnosis not present

## 2021-10-01 ENCOUNTER — Other Ambulatory Visit: Payer: Self-pay | Admitting: Nurse Practitioner

## 2021-10-01 DIAGNOSIS — I10 Essential (primary) hypertension: Secondary | ICD-10-CM

## 2021-10-01 DIAGNOSIS — F411 Generalized anxiety disorder: Secondary | ICD-10-CM

## 2021-10-12 ENCOUNTER — Encounter: Payer: Self-pay | Admitting: Nurse Practitioner

## 2021-10-12 ENCOUNTER — Telehealth (INDEPENDENT_AMBULATORY_CARE_PROVIDER_SITE_OTHER): Payer: PPO | Admitting: Nurse Practitioner

## 2021-10-12 ENCOUNTER — Other Ambulatory Visit: Payer: Self-pay

## 2021-10-12 VITALS — BP 120/74 | Resp 16 | Ht 67.5 in | Wt 120.0 lb

## 2021-10-12 DIAGNOSIS — J0110 Acute frontal sinusitis, unspecified: Secondary | ICD-10-CM

## 2021-10-12 DIAGNOSIS — R051 Acute cough: Secondary | ICD-10-CM

## 2021-10-12 MED ORDER — BENZONATATE 100 MG PO CAPS: 100.0000 mg | ORAL_CAPSULE | Freq: Two times a day (BID) | ORAL | 0 refills | Status: DC | PRN

## 2021-10-12 MED ORDER — AZITHROMYCIN 250 MG PO TABS: ORAL_TABLET | ORAL | 0 refills | Status: DC

## 2021-10-12 NOTE — Progress Notes (Signed)
[facility] [place], [place] [address]  Internal MEDICINE  Telephone Visit  Patient Name: Sandra Davidson  [id number]  [id number]  Date of Service: 10/12/2021  I connected with the patient at 10:05 AM by telephone and verified the patients identity using two identifiers.   I discussed the limitations, risks, security and privacy concerns of performing an evaluation and management service by telephone and the availability of in person appointments. I also discussed with the patient that there may be a patient responsible charge related to the service.  The patient expressed understanding and agrees to proceed.    Chief Complaint  Patient presents with   Telephone Assessment    Cell: [phone]   Telephone Screen   Cough    Feels she has a head cold, at times head is stuffy and other times runny.  Took 2 covid test one last night and one this morning and both negative    HPI Sandra Davidson presents for a telehealth virtual visit for a head cold. She tested negative for covid last night and this morning. She reports nasal congestion, sinus pain/pressure, and runny nose. She denies any fever, chills, body aches, fatigue, SOB, wheezing or chest tightness.    Current Medication: Outpatient Encounter Medications as of 10/12/2021  Medication Sig   benazepril (LOTENSIN) 5 MG tablet TAKE ONE TABLET (5 MG) BY MOUTH EVERY DAY   benzonatate (TESSALON) 100 MG capsule Take 1 capsule (100 mg total) by mouth 2 (two) times daily as needed for cough.   etodolac (LODINE) 400 MG tablet Take 1 tablet (400 mg total) by mouth 2 (two) times daily as needed for mild pain or moderate pain.   fluticasone (FLOVENT HFA) 110 MCG/ACT inhaler Inhale 2 puffs into the lungs in the morning and at bedtime.   gentamicin cream (GARAMYCIN) 0.1 % Apply 1 application topically 2 (two) times daily.   ibuprofen (ADVIL) 600 MG tablet Take 1 tablet (600 mg total) by mouth every 8 (eight) hours as needed.    meclizine (ANTIVERT) 25 MG tablet Take 1 tablet (25 mg total) by mouth 3 (three) times daily as needed for dizziness.   mupirocin ointment (BACTROBAN) 2 % Apply 1 application topically 2 (two) times daily.   spironolactone (ALDACTONE) 25 MG tablet Take 1 tablet (25 mg total) by mouth daily.   [DISCONTINUED] ALPRAZolam (XANAX) 0.5 MG tablet TAKE ONE TABLET (0.5 MG) BY MOUTH EVERY DAY AS NEEDED   [DISCONTINUED] azithromycin (ZITHROMAX) 250 MG tablet Take 2 tablets on day 1, then 1 tablet daily on days 2 through 5 (Patient not taking: Reported on 10/17/2021)   [DISCONTINUED] diclofenac Sodium (VOLTAREN) 1 % GEL Apply 4 g topically 4 (four) times daily.   [DISCONTINUED] gabapentin (NEURONTIN) 100 MG capsule Take 1 to 2 capsules po QPM for RLS   [DISCONTINUED] nitrofurantoin (MACRODANTIN) 100 MG capsule Take 1 capsule (100 mg total) by mouth every Monday, Wednesday, and Friday.   [DISCONTINUED] rOPINIRole (REQUIP) 1 MG tablet Take 1 tablet po QHS prn restless legs .   No facility-administered encounter medications on file as of 10/12/2021.    Surgical History: Past Surgical History:  Procedure Laterality Date   BACK SURGERY     MOHS SURGERY     NECK SURGERY     skin cancer removed  12/14/2020   TONSILLECTOMY     WRIST SURGERY Right     Medical History: Past Medical History:  Diagnosis Date   Allergy    Anxiety  Arthritis    Cancer ([place])    skin   Depression    GERD (gastroesophageal reflux disease)    Hypertension    Migraines     Family History: Family History  Problem Relation Age of Onset   Congestive Heart Failure Mother    COPD Mother    Emphysema Mother    Hypertension Mother    Kidney disease Mother    Cancer Father    Kidney disease Father    Bladder Cancer Neg Hx     Social History   Socioeconomic History   Marital status: Divorced    Spouse name: Not on file   Number of children: Not on file   Years of education: Not on file   Highest education level:  Not on file  Occupational History   Not on file  Tobacco Use   Smoking status: Former    Types: Cigarettes   Smokeless tobacco: Never  Vaping Use   Vaping Use: Every day  Substance and Sexual Activity   Alcohol use: No   Drug use: No   Sexual activity: Not Currently  Other Topics Concern   Not on file  Social History Narrative   Not on file   Social Determinants of Health   Financial Resource Strain: Not on file  Food Insecurity: Not on file  Transportation Needs: Not on file  Physical Activity: Not on file  Stress: Not on file  Social Connections: Not on file  Intimate Partner Violence: Not on file      Review of Systems  Constitutional:  Negative for chills, fatigue and fever.  HENT:  Positive for congestion, rhinorrhea, sinus pressure, sinus pain and sneezing. Negative for sore throat.   Respiratory:  Positive for cough. Negative for chest tightness, shortness of breath and wheezing.   Cardiovascular: Negative.  Negative for chest pain and palpitations.  Gastrointestinal: Negative.  Negative for abdominal pain, constipation, diarrhea, nausea and vomiting.  Musculoskeletal: Negative.  Negative for myalgias.  Skin:  Negative for rash.  Neurological:  Negative for dizziness, light-headedness and headaches.   Vital Signs: BP 120/74   Resp 16   Ht 5' 7.5" (1.715 m)   Wt 120 lb (54.4 kg)   BMI 18.52 kg/m    Observation/Objective: She is alert, oriented and engages in conversation appropriately. She does sound as though she is in any acute distress over the telephone.     Assessment/Plan: 1. Acute non-recurrent frontal sinusitis Empiric antibiotic treatment, azithromycin x5 days prescribed.   2. Acute cough Symptomatic treatment prescribed for cough.  - benzonatate (TESSALON) 100 MG capsule; Take 1 capsule (100 mg total) by mouth 2 (two) times daily as needed for cough.  Dispense: 30 capsule; Refill: 0   General Counseling: [name] verbalizes understanding of  the findings of today's phone visit and agrees with plan of treatment. I have discussed any further diagnostic evaluation that may be needed or ordered today. We also reviewed her medications today. she has been encouraged to call the office with any questions or concerns that should arise related to todays visit.  Return if symptoms worsen or fail to improve.   No orders of the defined types were placed in this encounter.   Meds ordered this encounter  Medications   DISCONTD: azithromycin (ZITHROMAX) 250 MG tablet    Sig: Take 2 tablets on day 1, then 1 tablet daily on days 2 through 5    Dispense:  6 tablet    Refill:  0  benzonatate (TESSALON) 100 MG capsule    Sig: Take 1 capsule (100 mg total) by mouth 2 (two) times daily as needed for cough.    Dispense:  30 capsule    Refill:  0    Time spent:10 Minutes Time spent with patient included reviewing progress notes, labs, imaging studies, and discussing plan for follow up.  [place] Controlled Substance Database was reviewed by me for overdose risk score (ORS) if appropriate.  This patient was seen by Jonetta Osgood, FNP-C in collaboration with Dr. Clayborn Bigness as a part of collaborative care agreement.  Mical Brun R. Valetta Fuller, MSN, FNP-C Internal medicine

## 2021-10-17 ENCOUNTER — Other Ambulatory Visit: Payer: Self-pay

## 2021-10-17 ENCOUNTER — Encounter: Payer: Self-pay | Admitting: Nurse Practitioner

## 2021-10-17 ENCOUNTER — Ambulatory Visit (INDEPENDENT_AMBULATORY_CARE_PROVIDER_SITE_OTHER): Payer: PPO | Admitting: Nurse Practitioner

## 2021-10-17 VITALS — BP 120/60 | HR 62 | Temp 98.4°F | Resp 16 | Ht 67.5 in | Wt 118.0 lb

## 2021-10-17 DIAGNOSIS — F411 Generalized anxiety disorder: Secondary | ICD-10-CM | POA: Diagnosis not present

## 2021-10-17 DIAGNOSIS — M5417 Radiculopathy, lumbosacral region: Secondary | ICD-10-CM

## 2021-10-17 DIAGNOSIS — J0110 Acute frontal sinusitis, unspecified: Secondary | ICD-10-CM | POA: Diagnosis not present

## 2021-10-17 DIAGNOSIS — Z87440 Personal history of urinary (tract) infections: Secondary | ICD-10-CM | POA: Diagnosis not present

## 2021-10-17 DIAGNOSIS — G629 Polyneuropathy, unspecified: Secondary | ICD-10-CM | POA: Diagnosis not present

## 2021-10-17 MED ORDER — GABAPENTIN 100 MG PO CAPS: ORAL_CAPSULE | ORAL | 1 refills | Status: DC

## 2021-10-17 MED ORDER — DICLOFENAC SODIUM 1 % EX GEL: 4.0000 g | Freq: Four times a day (QID) | CUTANEOUS | 2 refills | Status: DC

## 2021-10-17 MED ORDER — ALPRAZOLAM 0.5 MG PO TABS: ORAL_TABLET | ORAL | 2 refills | Status: DC

## 2021-10-17 MED ORDER — AZITHROMYCIN 250 MG PO TABS
ORAL_TABLET | ORAL | 0 refills | Status: AC
Start: 2021-10-17 — End: 2021-10-22

## 2021-10-17 MED ORDER — NITROFURANTOIN MACROCRYSTAL 100 MG PO CAPS: 100.0000 mg | ORAL_CAPSULE | ORAL | 1 refills | Status: DC

## 2021-10-17 MED ORDER — ROPINIROLE HCL 1 MG PO TABS: ORAL_TABLET | ORAL | 1 refills | Status: DC

## 2021-10-17 NOTE — Progress Notes (Signed)
[facility] [place], [place] [address]  Internal MEDICINE  Office Visit Note  Patient Name: Sandra Davidson  [id number]  [id number]  Date of Service: 11/14/2021  Chief Complaint  Patient presents with   Follow-up    refills   Depression   Gastroesophageal Reflux   Hypertension   Anxiety   Cough    Head cold, ears stopped up   Headache    HPI Mekhia presents for follow-up visit for medication refills she reports having a persistent head cold and feeling like her ears are stopped up as well as having a headache and she is requesting an additional Z-Pak.  She has hypertension and her blood pressure is within normal limits today    Current Medication: Outpatient Encounter Medications as of 10/17/2021  Medication Sig   benazepril (LOTENSIN) 5 MG tablet TAKE ONE TABLET (5 MG) BY MOUTH EVERY DAY   benzonatate (TESSALON) 100 MG capsule Take 1 capsule (100 mg total) by mouth 2 (two) times daily as needed for cough.   etodolac (LODINE) 400 MG tablet Take 1 tablet (400 mg total) by mouth 2 (two) times daily as needed for mild pain or moderate pain.   fluticasone (FLOVENT HFA) 110 MCG/ACT inhaler Inhale 2 puffs into the lungs in the morning and at bedtime.   gentamicin cream (GARAMYCIN) 0.1 % Apply 1 application topically 2 (two) times daily.   ibuprofen (ADVIL) 600 MG tablet Take 1 tablet (600 mg total) by mouth every 8 (eight) hours as needed.   meclizine (ANTIVERT) 25 MG tablet Take 1 tablet (25 mg total) by mouth 3 (three) times daily as needed for dizziness.   mupirocin ointment (BACTROBAN) 2 % Apply 1 application topically 2 (two) times daily.   spironolactone (ALDACTONE) 25 MG tablet Take 1 tablet (25 mg total) by mouth daily.   [DISCONTINUED] ALPRAZolam (XANAX) 0.5 MG tablet TAKE ONE TABLET (0.5 MG) BY MOUTH EVERY DAY AS NEEDED   [DISCONTINUED] diclofenac Sodium (VOLTAREN) 1 % GEL Apply 4 g topically 4 (four) times daily.   [DISCONTINUED] gabapentin  (NEURONTIN) 100 MG capsule Take 1 to 2 capsules po QPM for RLS   [DISCONTINUED] nitrofurantoin (MACRODANTIN) 100 MG capsule Take 1 capsule (100 mg total) by mouth every Monday, Wednesday, and Friday.   [DISCONTINUED] rOPINIRole (REQUIP) 1 MG tablet Take 1 tablet po QHS prn restless legs .   ALPRAZolam (XANAX) 0.5 MG tablet TAKE ONE TABLET (0.5 MG) BY MOUTH EVERY DAY AS NEEDED   [EXPIRED] azithromycin (ZITHROMAX) 250 MG tablet Take 2 tablets on day 1, then 1 tablet daily on days 2 through 5   diclofenac Sodium (VOLTAREN) 1 % GEL Apply 4 g topically 4 (four) times daily.   gabapentin (NEURONTIN) 100 MG capsule Take 1 to 2 capsules po QPM for RLS   nitrofurantoin (MACRODANTIN) 100 MG capsule Take 1 capsule (100 mg total) by mouth every Monday, Wednesday, and Friday.   rOPINIRole (REQUIP) 1 MG tablet Take 1 tablet po QHS prn restless legs .   [DISCONTINUED] azithromycin (ZITHROMAX) 250 MG tablet Take 2 tablets on day 1, then 1 tablet daily on days 2 through 5 (Patient not taking: Reported on 10/17/2021)   No facility-administered encounter medications on file as of 10/17/2021.    Surgical History: Past Surgical History:  Procedure Laterality Date   BACK SURGERY     MOHS SURGERY     NECK SURGERY     skin cancer removed  12/14/2020   TONSILLECTOMY  WRIST SURGERY Right     Medical History: Past Medical History:  Diagnosis Date   Allergy    Anxiety    Arthritis    Cancer ([place])    skin   Depression    GERD (gastroesophageal reflux disease)    Hypertension    Migraines     Family History: Family History  Problem Relation Age of Onset   Congestive Heart Failure Mother    COPD Mother    Emphysema Mother    Hypertension Mother    Kidney disease Mother    Cancer Father    Kidney disease Father    Bladder Cancer Neg Hx     Social History   Socioeconomic History   Marital status: Divorced    Spouse name: Not on file   Number of children: Not on file   Years of education:  Not on file   Highest education level: Not on file  Occupational History   Not on file  Tobacco Use   Smoking status: Former    Types: Cigarettes   Smokeless tobacco: Never  Vaping Use   Vaping Use: Every day  Substance and Sexual Activity   Alcohol use: No   Drug use: No   Sexual activity: Not Currently  Other Topics Concern   Not on file  Social History Narrative   Not on file   Social Determinants of Health   Financial Resource Strain: Not on file  Food Insecurity: Not on file  Transportation Needs: Not on file  Physical Activity: Not on file  Stress: Not on file  Social Connections: Not on file  Intimate Partner Violence: Not on file      Review of Systems  Constitutional:  Negative for chills, fatigue and unexpected weight change.  HENT:  Negative for congestion, rhinorrhea, sneezing and sore throat.   Eyes:  Negative for redness.  Respiratory:  Negative for cough, chest tightness and shortness of breath.   Cardiovascular:  Negative for chest pain and palpitations.  Gastrointestinal:  Negative for abdominal pain, constipation, diarrhea, nausea and vomiting.  Genitourinary:  Negative for dysuria and frequency.  Musculoskeletal:  Negative for arthralgias, back pain, joint swelling and neck pain.  Skin:  Negative for rash.  Neurological: Negative.  Negative for tremors and numbness.  Hematological:  Negative for adenopathy. Does not bruise/bleed easily.  Psychiatric/Behavioral:  Negative for behavioral problems (Depression), sleep disturbance and suicidal ideas. The patient is not nervous/anxious.    Vital Signs: BP 120/60   Pulse 62   Temp 98.4 F (36.9 C)   Resp 16   Ht 5' 7.5" (1.715 m)   Wt 118 lb (53.5 kg)   SpO2 98%   BMI 18.21 kg/m    Physical Exam Vitals reviewed.  Constitutional:      General: She is not in acute distress.    Appearance: Normal appearance. She is well-developed. She is not ill-appearing.  HENT:     Head: Normocephalic and  atraumatic.  Eyes:     Pupils: Pupils are equal, round, and reactive to light.  Cardiovascular:     Rate and Rhythm: Normal rate and regular rhythm.  Pulmonary:     Effort: Pulmonary effort is normal. No respiratory distress.  Neurological:     Mental Status: She is alert and oriented to person, place, and time.     Cranial Nerves: No cranial nerve deficit.     Coordination: Coordination normal.     Gait: Gait normal.  Psychiatric:  Mood and Affect: Mood normal.        Behavior: Behavior normal.       Assessment/Plan: 1. Acute non-recurrent frontal sinusitis Empiric antibiotic treatment prescribed - azithromycin (ZITHROMAX) 250 MG tablet; Take 2 tablets on day 1, then 1 tablet daily on days 2 through 5  Dispense: 6 tablet; Refill: 0  2. Lumbosacral radiculopathy due to intervertebral disc disorder Refill ordered - diclofenac Sodium (VOLTAREN) 1 % GEL; Apply 4 g topically 4 (four) times daily.  Dispense: 700 g; Refill: 2  3. Neuropathy Refill ordered - gabapentin (NEURONTIN) 100 MG capsule; Take 1 to 2 capsules po QPM for RLS  Dispense: 180 capsule; Refill: 1 - rOPINIRole (REQUIP) 1 MG tablet; Take 1 tablet po QHS prn restless legs .  Dispense: 90 tablet; Refill: 1  4. History of recurrent UTIs Empiric antibiotic prescribed.  - nitrofurantoin (MACRODANTIN) 100 MG capsule; Take 1 capsule (100 mg total) by mouth every Monday, Wednesday, and Friday.  Dispense: 45 capsule; Refill: 1  5. Generalized anxiety disorder Refill ordered - ALPRAZolam (XANAX) 0.5 MG tablet; TAKE ONE TABLET (0.5 MG) BY MOUTH EVERY DAY AS NEEDED  Dispense: 30 tablet; Refill: 2   General Counseling: Gatha verbalizes understanding of the findings of todays visit and agrees with plan of treatment. I have discussed any further diagnostic evaluation that may be needed or ordered today. We also reviewed her medications today. she has been encouraged to call the office with any questions or concerns that  should arise related to todays visit.    No orders of the defined types were placed in this encounter.   Meds ordered this encounter  Medications   azithromycin (ZITHROMAX) 250 MG tablet    Sig: Take 2 tablets on day 1, then 1 tablet daily on days 2 through 5    Dispense:  6 tablet    Refill:  0   ALPRAZolam (XANAX) 0.5 MG tablet    Sig: TAKE ONE TABLET (0.5 MG) BY MOUTH EVERY DAY AS NEEDED    Dispense:  30 tablet    Refill:  2   diclofenac Sodium (VOLTAREN) 1 % GEL    Sig: Apply 4 g topically 4 (four) times daily.    Dispense:  700 g    Refill:  2   gabapentin (NEURONTIN) 100 MG capsule    Sig: Take 1 to 2 capsules po QPM for RLS    Dispense:  180 capsule    Refill:  1   nitrofurantoin (MACRODANTIN) 100 MG capsule    Sig: Take 1 capsule (100 mg total) by mouth every Monday, Wednesday, and Friday.    Dispense:  45 capsule    Refill:  1   rOPINIRole (REQUIP) 1 MG tablet    Sig: Take 1 tablet po QHS prn restless legs .    Dispense:  90 tablet    Refill:  1    Return in about 3 months (around 01/17/2022) for F/U, med refill, Akayla Brass PCP.   Total time spent:30 Minutes Time spent includes review of chart, medications, test results, and follow up plan with the patient.   [place] Controlled Substance Database was reviewed by me.  This patient was seen by Jonetta Osgood, FNP-C in collaboration with Dr. Clayborn Bigness as a part of collaborative care agreement.   Meha Vidrine R. Valetta Fuller, MSN, FNP-C Internal medicine

## 2021-10-22 DIAGNOSIS — M5412 Radiculopathy, cervical region: Secondary | ICD-10-CM | POA: Diagnosis not present

## 2021-10-22 DIAGNOSIS — M9903 Segmental and somatic dysfunction of lumbar region: Secondary | ICD-10-CM | POA: Diagnosis not present

## 2021-10-22 DIAGNOSIS — M4306 Spondylolysis, lumbar region: Secondary | ICD-10-CM | POA: Diagnosis not present

## 2021-10-22 DIAGNOSIS — M9901 Segmental and somatic dysfunction of cervical region: Secondary | ICD-10-CM | POA: Diagnosis not present

## 2021-11-02 ENCOUNTER — Encounter: Payer: Self-pay | Admitting: Nurse Practitioner

## 2021-11-05 DIAGNOSIS — M9901 Segmental and somatic dysfunction of cervical region: Secondary | ICD-10-CM | POA: Diagnosis not present

## 2021-11-05 DIAGNOSIS — M4306 Spondylolysis, lumbar region: Secondary | ICD-10-CM | POA: Diagnosis not present

## 2021-11-05 DIAGNOSIS — M9903 Segmental and somatic dysfunction of lumbar region: Secondary | ICD-10-CM | POA: Diagnosis not present

## 2021-11-05 DIAGNOSIS — M5412 Radiculopathy, cervical region: Secondary | ICD-10-CM | POA: Diagnosis not present

## 2021-12-17 ENCOUNTER — Other Ambulatory Visit: Payer: Self-pay

## 2021-12-17 ENCOUNTER — Ambulatory Visit (INDEPENDENT_AMBULATORY_CARE_PROVIDER_SITE_OTHER): Payer: Medicaid Other | Admitting: Physician Assistant

## 2021-12-17 ENCOUNTER — Encounter: Payer: Self-pay | Admitting: Physician Assistant

## 2021-12-17 VITALS — BP 153/63 | HR 61 | Temp 98.3°F | Resp 16 | Ht 67.5 in | Wt 119.2 lb

## 2021-12-17 DIAGNOSIS — H65192 Other acute nonsuppurative otitis media, left ear: Secondary | ICD-10-CM

## 2021-12-17 DIAGNOSIS — I10 Essential (primary) hypertension: Secondary | ICD-10-CM

## 2021-12-17 MED ORDER — AZITHROMYCIN 250 MG PO TABS: ORAL_TABLET | ORAL | 0 refills | Status: DC

## 2021-12-17 NOTE — Progress Notes (Signed)
[facility] [place], [place] [address]  Internal MEDICINE  Office Visit Note  Patient Name: Sandra Davidson  [id number]  [id number]  Date of Service: 12/17/2021  Chief Complaint  Patient presents with   Acute Visit    Ear pain, left ear is worse that right   Ear Pain     HPI Pt is here for a sick visit. -Woke up around 2am with ears hurting. Left ear worse than right. Had bothered her some off an on the last few days -occasional cough and postnasal drip as well -Did take pseudophed yesterday, but not today -Ears bothering her a lot now all morning -Denies much sinus congestion  Current Medication:  Outpatient Encounter Medications as of 12/17/2021  Medication Sig   ALPRAZolam (XANAX) 0.5 MG tablet TAKE ONE TABLET (0.5 MG) BY MOUTH EVERY DAY AS NEEDED   azithromycin (ZITHROMAX) 250 MG tablet Take one tab a day for 10 days for uri   benazepril (LOTENSIN) 5 MG tablet TAKE ONE TABLET (5 MG) BY MOUTH EVERY DAY   benzonatate (TESSALON) 100 MG capsule Take 1 capsule (100 mg total) by mouth 2 (two) times daily as needed for cough.   diclofenac Sodium (VOLTAREN) 1 % GEL Apply 4 g topically 4 (four) times daily.   etodolac (LODINE) 400 MG tablet Take 1 tablet (400 mg total) by mouth 2 (two) times daily as needed for mild pain or moderate pain.   fluticasone (FLOVENT HFA) 110 MCG/ACT inhaler Inhale 2 puffs into the lungs in the morning and at bedtime.   gabapentin (NEURONTIN) 100 MG capsule Take 1 to 2 capsules po QPM for RLS   gentamicin cream (GARAMYCIN) 0.1 % Apply 1 application topically 2 (two) times daily.   ibuprofen (ADVIL) 600 MG tablet Take 1 tablet (600 mg total) by mouth every 8 (eight) hours as needed.   meclizine (ANTIVERT) 25 MG tablet Take 1 tablet (25 mg total) by mouth 3 (three) times daily as needed for dizziness.   mupirocin ointment (BACTROBAN) 2 % Apply 1 application topically 2 (two) times daily.   nitrofurantoin (MACRODANTIN) 100 MG  capsule Take 1 capsule (100 mg total) by mouth every Monday, Wednesday, and Friday.   rOPINIRole (REQUIP) 1 MG tablet Take 1 tablet po QHS prn restless legs .   spironolactone (ALDACTONE) 25 MG tablet Take 1 tablet (25 mg total) by mouth daily.   No facility-administered encounter medications on file as of 12/17/2021.      Medical History: Past Medical History:  Diagnosis Date   Allergy    Anxiety    Arthritis    Cancer ([place])    skin   Depression    GERD (gastroesophageal reflux disease)    Hypertension    Migraines      Vital Signs: BP (!) 153/63    Pulse 61    Temp 98.3 F (36.8 C)    Resp 16    Ht 5' 7.5" (1.715 m)    Wt 119 lb 3.2 oz (54.1 kg)    SpO2 97%    BMI 18.39 kg/m    Review of Systems  Constitutional:  Negative for fatigue and fever.  HENT:  Positive for ear pain and postnasal drip. Negative for congestion and mouth sores.   Respiratory:  Negative for cough, shortness of breath and wheezing.   Cardiovascular:  Negative for chest pain.  Genitourinary:  Negative for flank pain.  Psychiatric/Behavioral:  Positive for sleep disturbance.    Physical Exam  Vitals and nursing note reviewed.  Constitutional:      General: She is not in acute distress.    Appearance: She is well-developed. She is not diaphoretic.  HENT:     Head: Normocephalic and atraumatic.     Right Ear: Ear canal and external ear normal.     Left Ear: Ear canal and external ear normal.     Mouth/Throat:     Pharynx: No oropharyngeal exudate.  Eyes:     Pupils: Pupils are equal, round, and reactive to light.  Neck:     Thyroid: No thyromegaly.     Vascular: No JVD.     Trachea: No tracheal deviation.  Cardiovascular:     Rate and Rhythm: Normal rate and regular rhythm.     Heart sounds: Normal heart sounds. No murmur heard.   No friction rub. No gallop.  Pulmonary:     Effort: Pulmonary effort is normal. No respiratory distress.     Breath sounds: No wheezing or rales.  Chest:      Chest wall: No tenderness.  Abdominal:     General: Bowel sounds are normal.     Palpations: Abdomen is soft.  Musculoskeletal:        General: Normal range of motion.     Cervical back: Normal range of motion and neck supple.  Lymphadenopathy:     Cervical: No cervical adenopathy.  Skin:    General: Skin is warm and dry.  Neurological:     Mental Status: She is alert and oriented to person, place, and time.     Cranial Nerves: No cranial nerve deficit.  Psychiatric:        Behavior: Behavior normal.        Thought Content: Thought content normal.        Judgment: Judgment normal.      Assessment/Plan: 1. Other non-recurrent acute nonsuppurative otitis media of left ear Will start on zpak for left ear infection with pressure on right ear as well. May use mucinex or flonase if congestion. - azithromycin (ZITHROMAX) 250 MG tablet; Take one tab a day for 10 days for uri  Dispense: 10 tablet; Refill: 0  2. Essential hypertension Elevated in office, likely secondary to ear pain. Will continue current medications and monitor at home  General Counseling: Sandra Davidson verbalizes understanding of the findings of todays visit and agrees with plan of treatment. I have discussed any further diagnostic evaluation that may be needed or ordered today. We also reviewed her medications today. she has been encouraged to call the office with any questions or concerns that should arise related to todays visit.    Counseling:    No orders of the defined types were placed in this encounter.   Meds ordered this encounter  Medications   azithromycin (ZITHROMAX) 250 MG tablet    Sig: Take one tab a day for 10 days for uri    Dispense:  10 tablet    Refill:  0    Time spent:25 Minutes

## 2022-01-16 ENCOUNTER — Other Ambulatory Visit: Payer: Self-pay

## 2022-01-16 ENCOUNTER — Encounter: Payer: Self-pay | Admitting: Nurse Practitioner

## 2022-01-16 ENCOUNTER — Ambulatory Visit (INDEPENDENT_AMBULATORY_CARE_PROVIDER_SITE_OTHER): Payer: Medicare Other | Admitting: Nurse Practitioner

## 2022-01-16 VITALS — BP 140/72 | HR 66 | Temp 98.3°F | Resp 16 | Ht 67.5 in | Wt 116.8 lb

## 2022-01-16 DIAGNOSIS — Z87440 Personal history of urinary (tract) infections: Secondary | ICD-10-CM | POA: Diagnosis not present

## 2022-01-16 DIAGNOSIS — G629 Polyneuropathy, unspecified: Secondary | ICD-10-CM | POA: Diagnosis not present

## 2022-01-16 DIAGNOSIS — I10 Essential (primary) hypertension: Secondary | ICD-10-CM | POA: Diagnosis not present

## 2022-01-16 DIAGNOSIS — M5417 Radiculopathy, lumbosacral region: Secondary | ICD-10-CM

## 2022-01-16 DIAGNOSIS — F411 Generalized anxiety disorder: Secondary | ICD-10-CM

## 2022-01-16 MED ORDER — ETODOLAC 400 MG PO TABS: 400.0000 mg | ORAL_TABLET | Freq: Two times a day (BID) | ORAL | 2 refills | Status: AC | PRN

## 2022-01-16 MED ORDER — ALPRAZOLAM 0.5 MG PO TABS: ORAL_TABLET | ORAL | 2 refills | Status: DC

## 2022-01-16 MED ORDER — BENAZEPRIL HCL 5 MG PO TABS: 5.0000 mg | ORAL_TABLET | Freq: Every day | ORAL | 1 refills | Status: DC

## 2022-01-16 MED ORDER — IBUPROFEN 600 MG PO TABS: 600.0000 mg | ORAL_TABLET | Freq: Three times a day (TID) | ORAL | 3 refills | Status: AC | PRN

## 2022-01-16 MED ORDER — ROPINIROLE HCL 1 MG PO TABS: ORAL_TABLET | ORAL | 1 refills | Status: DC

## 2022-01-16 MED ORDER — NITROFURANTOIN MACROCRYSTAL 100 MG PO CAPS: 100.0000 mg | ORAL_CAPSULE | ORAL | 1 refills | Status: DC

## 2022-01-16 NOTE — Progress Notes (Signed)
[facility] / [facility], [place] [address]  Internal MEDICINE  Office Visit Note  Patient Name: Sandra Davidson  [id number]  [id number]  Date of Service: 01/16/2022  Chief Complaint  Patient presents with   Follow-up   Depression   Gastroesophageal Reflux   Hypertension   Anxiety   Medication Refill    HPI Sandra Davidson presents for a follow up visit for medication review, hypertension and arthritis. Her blood pressure is well controlled with current medication. She takes nitrofurantoin 3 times a week for UTI prophylaxis. She takes ibuprofen as needed for mild to moderate arthritic pain and takes etodolac as needed for severe arthritic pain and is aware that she cannot take the 2 medications at the same time due to increased risk of bleeding. She is in need of some refills as well.     Current Medication: Outpatient Encounter Medications as of 01/16/2022  Medication Sig   diclofenac Sodium (VOLTAREN) 1 % GEL Apply 4 g topically 4 (four) times daily.   gabapentin (NEURONTIN) 100 MG capsule Take 1 to 2 capsules po QPM for RLS   meclizine (ANTIVERT) 25 MG tablet Take 1 tablet (25 mg total) by mouth 3 (three) times daily as needed for dizziness.   spironolactone (ALDACTONE) 25 MG tablet Take 1 tablet (25 mg total) by mouth daily.   [DISCONTINUED] ALPRAZolam (XANAX) 0.5 MG tablet TAKE ONE TABLET (0.5 MG) BY MOUTH EVERY DAY AS NEEDED   [DISCONTINUED] benazepril (LOTENSIN) 5 MG tablet TAKE ONE TABLET (5 MG) BY MOUTH EVERY DAY   [DISCONTINUED] benzonatate (TESSALON) 100 MG capsule Take 1 capsule (100 mg total) by mouth 2 (two) times daily as needed for cough.   [DISCONTINUED] etodolac (LODINE) 400 MG tablet Take 1 tablet (400 mg total) by mouth 2 (two) times daily as needed for mild pain or moderate pain.   [DISCONTINUED] fluticasone (FLOVENT HFA) 110 MCG/ACT inhaler Inhale 2 puffs into the lungs in the morning and at bedtime.   [DISCONTINUED] gentamicin cream (GARAMYCIN) 0.1 %  Apply 1 application topically 2 (two) times daily.   [DISCONTINUED] ibuprofen (ADVIL) 600 MG tablet Take 1 tablet (600 mg total) by mouth every 8 (eight) hours as needed.   [DISCONTINUED] mupirocin ointment (BACTROBAN) 2 % Apply 1 application topically 2 (two) times daily.   [DISCONTINUED] nitrofurantoin (MACRODANTIN) 100 MG capsule Take 1 capsule (100 mg total) by mouth every Monday, Wednesday, and Friday.   [DISCONTINUED] rOPINIRole (REQUIP) 1 MG tablet Take 1 tablet po QHS prn restless legs .   [START ON 01/23/2022] ALPRAZolam (XANAX) 0.5 MG tablet TAKE ONE TABLET (0.5 MG) BY MOUTH EVERY DAY AS NEEDED   [START ON 02/13/2022] benazepril (LOTENSIN) 5 MG tablet Take 1 tablet (5 mg total) by mouth daily.   etodolac (LODINE) 400 MG tablet Take 1 tablet (400 mg total) by mouth 2 (two) times daily as needed for mild pain or moderate pain.   ibuprofen (ADVIL) 600 MG tablet Take 1 tablet (600 mg total) by mouth every 8 (eight) hours as needed.   nitrofurantoin (MACRODANTIN) 100 MG capsule Take 1 capsule (100 mg total) by mouth every Monday, Wednesday, and Friday.   rOPINIRole (REQUIP) 1 MG tablet Take 1 tablet po QHS prn restless legs .   [DISCONTINUED] azithromycin (ZITHROMAX) 250 MG tablet Take one tab a day for 10 days for uri (Patient not taking: Reported on 01/16/2022)   No facility-administered encounter medications on file as of 01/16/2022.    Surgical History: Past Surgical History:  Procedure Laterality Date   BACK SURGERY     MOHS SURGERY     NECK SURGERY     skin cancer removed  12/14/2020   TONSILLECTOMY     WRIST SURGERY Right     Medical History: Past Medical History:  Diagnosis Date   Allergy    Anxiety    Arthritis    Cancer ([place])    skin   Depression    GERD (gastroesophageal reflux disease)    Hypertension    Migraines     Family History: Family History  Problem Relation Age of Onset   Congestive Heart Failure Mother    COPD Mother    Emphysema Mother     Hypertension Mother    Kidney disease Mother    Cancer Father    Kidney disease Father    Bladder Cancer Neg Hx     Social History   Socioeconomic History   Marital status: Divorced    Spouse name: Not on file   Number of children: Not on file   Years of education: Not on file   Highest education level: Not on file  Occupational History   Not on file  Tobacco Use   Smoking status: Former    Types: Cigarettes   Smokeless tobacco: Never  Vaping Use   Vaping Use: Every day  Substance and Sexual Activity   Alcohol use: No   Drug use: No   Sexual activity: Not Currently  Other Topics Concern   Not on file  Social History Narrative   Not on file   Social Determinants of Health   Financial Resource Strain: Not on file  Food Insecurity: Not on file  Transportation Needs: Not on file  Physical Activity: Not on file  Stress: Not on file  Social Connections: Not on file  Intimate Partner Violence: Not on file      Review of Systems  Constitutional:  Negative for chills, fatigue and unexpected weight change.  HENT:  Negative for congestion, rhinorrhea, sneezing and sore throat.   Eyes:  Negative for redness.  Respiratory:  Negative for cough, chest tightness and shortness of breath.   Cardiovascular:  Negative for chest pain and palpitations.  Gastrointestinal:  Negative for abdominal pain, constipation, diarrhea, nausea and vomiting.  Genitourinary:  Negative for dysuria and frequency.  Musculoskeletal:  Negative for arthralgias, back pain, joint swelling and neck pain.  Skin:  Negative for rash.  Neurological: Negative.  Negative for tremors and numbness.  Hematological:  Negative for adenopathy. Does not bruise/bleed easily.  Psychiatric/Behavioral:  Negative for behavioral problems (Depression), sleep disturbance and suicidal ideas. The patient is not nervous/anxious.    Vital Signs: BP 140/72    Pulse 66    Temp 98.3 F (36.8 C)    Resp 16    Ht 5' 7.5" (1.715 m)     Wt 116 lb 12.8 oz (53 kg)    SpO2 97%    BMI 18.02 kg/m    Physical Exam Vitals reviewed.  Constitutional:      General: She is not in acute distress.    Appearance: Normal appearance. She is not ill-appearing.  HENT:     Head: Normocephalic and atraumatic.  Eyes:     Pupils: Pupils are equal, round, and reactive to light.  Cardiovascular:     Rate and Rhythm: Normal rate and regular rhythm.  Pulmonary:     Effort: Pulmonary effort is normal. No respiratory distress.  Neurological:     Mental  Status: She is alert and oriented to person, place, and time.     Cranial Nerves: No cranial nerve deficit.     Gait: Gait normal.  Psychiatric:        Mood and Affect: Mood normal.        Behavior: Behavior normal.       Assessment/Plan: 1. Essential hypertension Stable with current medication, continue as prescribed. Refills ordered  - benazepril (LOTENSIN) 5 MG tablet; Take 1 tablet (5 mg total) by mouth daily.  Dispense: 90 tablet; Refill: 1  2. Neuropathy Stable, continue as prescribed, refills ordered.  - rOPINIRole (REQUIP) 1 MG tablet; Take 1 tablet po QHS prn restless legs .  Dispense: 90 tablet; Refill: 1  3. History of recurrent UTIs Takes nitrofurantoin for UTI prophylaxis 3x weekly.  - nitrofurantoin (MACRODANTIN) 100 MG capsule; Take 1 capsule (100 mg total) by mouth every Monday, Wednesday, and Friday.  Dispense: 45 capsule; Refill: 1  4. Lumbosacral radiculopathy due to intervertebral disc disorder Takes ibuprofen or etodolac as needed, refills ordered.  - etodolac (LODINE) 400 MG tablet; Take 1 tablet (400 mg total) by mouth 2 (two) times daily as needed for mild pain or moderate pain.  Dispense: 30 tablet; Refill: 2 - ibuprofen (ADVIL) 600 MG tablet; Take 1 tablet (600 mg total) by mouth every 8 (eight) hours as needed.  Dispense: 30 tablet; Refill: 3  5. Generalized anxiety disorder Stable, takes alprazolam, refills ordered. - ALPRAZolam (XANAX) 0.5 MG  tablet; TAKE ONE TABLET (0.5 MG) BY MOUTH EVERY DAY AS NEEDED  Dispense: 30 tablet; Refill: 2   General Counseling: Sandra Davidson verbalizes understanding of the findings of todays visit and agrees with plan of treatment. I have discussed any further diagnostic evaluation that may be needed or ordered today. We also reviewed her medications today. she has been encouraged to call the office with any questions or concerns that should arise related to todays visit.    No orders of the defined types were placed in this encounter.   Meds ordered this encounter  Medications   nitrofurantoin (MACRODANTIN) 100 MG capsule    Sig: Take 1 capsule (100 mg total) by mouth every Monday, Wednesday, and Friday.    Dispense:  45 capsule    Refill:  1   benazepril (LOTENSIN) 5 MG tablet    Sig: Take 1 tablet (5 mg total) by mouth daily.    Dispense:  90 tablet    Refill:  1   rOPINIRole (REQUIP) 1 MG tablet    Sig: Take 1 tablet po QHS prn restless legs .    Dispense:  90 tablet    Refill:  1   ALPRAZolam (XANAX) 0.5 MG tablet    Sig: TAKE ONE TABLET (0.5 MG) BY MOUTH EVERY DAY AS NEEDED    Dispense:  30 tablet    Refill:  2   etodolac (LODINE) 400 MG tablet    Sig: Take 1 tablet (400 mg total) by mouth 2 (two) times daily as needed for mild pain or moderate pain.    Dispense:  30 tablet    Refill:  2    Patient takes as needed for severe back pain, she is aware not to take with any other NSAIDs and will call the pharmacy when she needs it filled. Do not fill today.   ibuprofen (ADVIL) 600 MG tablet    Sig: Take 1 tablet (600 mg total) by mouth every 8 (eight) hours as needed.    Dispense:  30 tablet    Refill:  3    Do not fill today, patient will call when she needs the prescription filled.    Return for previously scheduled, CPE, Sandra Davidson PCP in august. .   Total time spent:30 Minutes Time spent includes review of chart, medications, test results, and follow up plan with the patient.     Controlled Substance Database was reviewed by me.  This patient was seen by Jonetta Osgood, FNP-C in collaboration with Dr. Clayborn Bigness as a part of collaborative care agreement.   Johnni Wunschel R. Valetta Fuller, MSN, FNP-C Internal medicine

## 2022-03-12 DIAGNOSIS — M9903 Segmental and somatic dysfunction of lumbar region: Secondary | ICD-10-CM | POA: Diagnosis not present

## 2022-03-12 DIAGNOSIS — M5412 Radiculopathy, cervical region: Secondary | ICD-10-CM | POA: Diagnosis not present

## 2022-03-12 DIAGNOSIS — M9901 Segmental and somatic dysfunction of cervical region: Secondary | ICD-10-CM | POA: Diagnosis not present

## 2022-03-12 DIAGNOSIS — M4306 Spondylolysis, lumbar region: Secondary | ICD-10-CM | POA: Diagnosis not present

## 2022-03-26 DIAGNOSIS — M9901 Segmental and somatic dysfunction of cervical region: Secondary | ICD-10-CM | POA: Diagnosis not present

## 2022-03-26 DIAGNOSIS — M5412 Radiculopathy, cervical region: Secondary | ICD-10-CM | POA: Diagnosis not present

## 2022-03-26 DIAGNOSIS — M4306 Spondylolysis, lumbar region: Secondary | ICD-10-CM | POA: Diagnosis not present

## 2022-03-26 DIAGNOSIS — M9903 Segmental and somatic dysfunction of lumbar region: Secondary | ICD-10-CM | POA: Diagnosis not present

## 2022-04-01 DIAGNOSIS — M9901 Segmental and somatic dysfunction of cervical region: Secondary | ICD-10-CM | POA: Diagnosis not present

## 2022-04-01 DIAGNOSIS — M5412 Radiculopathy, cervical region: Secondary | ICD-10-CM | POA: Diagnosis not present

## 2022-04-01 DIAGNOSIS — M4306 Spondylolysis, lumbar region: Secondary | ICD-10-CM | POA: Diagnosis not present

## 2022-04-01 DIAGNOSIS — M9903 Segmental and somatic dysfunction of lumbar region: Secondary | ICD-10-CM | POA: Diagnosis not present

## 2022-04-09 DIAGNOSIS — L570 Actinic keratosis: Secondary | ICD-10-CM | POA: Diagnosis not present

## 2022-04-09 DIAGNOSIS — L821 Other seborrheic keratosis: Secondary | ICD-10-CM | POA: Diagnosis not present

## 2022-04-09 DIAGNOSIS — L814 Other melanin hyperpigmentation: Secondary | ICD-10-CM | POA: Diagnosis not present

## 2022-04-09 DIAGNOSIS — D485 Neoplasm of uncertain behavior of skin: Secondary | ICD-10-CM | POA: Diagnosis not present

## 2022-04-09 DIAGNOSIS — Z08 Encounter for follow-up examination after completed treatment for malignant neoplasm: Secondary | ICD-10-CM | POA: Diagnosis not present

## 2022-04-09 DIAGNOSIS — C44529 Squamous cell carcinoma of skin of other part of trunk: Secondary | ICD-10-CM | POA: Diagnosis not present

## 2022-04-09 DIAGNOSIS — Z85828 Personal history of other malignant neoplasm of skin: Secondary | ICD-10-CM | POA: Diagnosis not present

## 2022-04-09 DIAGNOSIS — D1801 Hemangioma of skin and subcutaneous tissue: Secondary | ICD-10-CM | POA: Diagnosis not present

## 2022-04-15 DIAGNOSIS — M170 Bilateral primary osteoarthritis of knee: Secondary | ICD-10-CM | POA: Diagnosis not present

## 2022-04-16 DIAGNOSIS — M19032 Primary osteoarthritis, left wrist: Secondary | ICD-10-CM | POA: Diagnosis not present

## 2022-04-29 ENCOUNTER — Telehealth: Payer: Self-pay

## 2022-04-29 ENCOUNTER — Other Ambulatory Visit: Payer: Self-pay | Admitting: Nurse Practitioner

## 2022-04-29 DIAGNOSIS — F411 Generalized anxiety disorder: Secondary | ICD-10-CM

## 2022-04-29 NOTE — Telephone Encounter (Signed)
I am denying this rx, she needs follow up for med refill

## 2022-04-29 NOTE — Telephone Encounter (Signed)
Sent reminder to [facility] for med refill and that pt made appt for 05/01/22 but still needs refill

## 2022-04-30 ENCOUNTER — Other Ambulatory Visit: Payer: Self-pay | Admitting: Nurse Practitioner

## 2022-04-30 DIAGNOSIS — F411 Generalized anxiety disorder: Secondary | ICD-10-CM

## 2022-04-30 MED ORDER — ALPRAZOLAM 0.5 MG PO TABS: ORAL_TABLET | ORAL | 0 refills | Status: DC

## 2022-05-01 ENCOUNTER — Ambulatory Visit (INDEPENDENT_AMBULATORY_CARE_PROVIDER_SITE_OTHER): Payer: Medicare Other | Admitting: Nurse Practitioner

## 2022-05-01 ENCOUNTER — Encounter: Payer: Self-pay | Admitting: Nurse Practitioner

## 2022-05-01 VITALS — BP 140/63 | HR 60 | Temp 98.6°F | Resp 16 | Ht 67.5 in | Wt 123.6 lb

## 2022-05-01 DIAGNOSIS — E782 Mixed hyperlipidemia: Secondary | ICD-10-CM | POA: Diagnosis not present

## 2022-05-01 DIAGNOSIS — E538 Deficiency of other specified B group vitamins: Secondary | ICD-10-CM | POA: Diagnosis not present

## 2022-05-01 DIAGNOSIS — F411 Generalized anxiety disorder: Secondary | ICD-10-CM

## 2022-05-01 DIAGNOSIS — I10 Essential (primary) hypertension: Secondary | ICD-10-CM

## 2022-05-01 DIAGNOSIS — E559 Vitamin D deficiency, unspecified: Secondary | ICD-10-CM

## 2022-05-01 DIAGNOSIS — R7303 Prediabetes: Secondary | ICD-10-CM

## 2022-05-01 MED ORDER — ALPRAZOLAM 0.5 MG PO TABS: 0.5000 mg | ORAL_TABLET | Freq: Every day | ORAL | 0 refills | Status: AC | PRN

## 2022-05-01 NOTE — Progress Notes (Signed)
[facility] [place], [place] [address]  Internal MEDICINE  Office Visit Note  Patient Name: Sandra Davidson  [id number]  [id number]  Date of Service: 05/01/2022  Chief Complaint  Patient presents with   Follow-up    Discuss meds   Depression   Hypertension   Gastroesophageal Reflux   Anxiety    HPI Mariposa presents for follow-up visit for refills of alprazolam.  Patient does need a follow-up visit every 3 months to refill her alprazolam and by human error, her follow-up visit for the month of May did not get scheduled.  She is a consistent patient who always takes her medications as prescribed and comes to her office visits regularly.  She has no other concerns today.  Her medication doses are appropriate and she feels like she is doing well.  Her annual wellness visit and physical exam is scheduled to be done in August and additional refills of alprazolam will be ordered at that time as well She will be due for routine labs for her annual wellness visit in August and labs will be ordered today so she can get them done prior to her appointment.     Current Medication: Outpatient Encounter Medications as of 05/01/2022  Medication Sig   benazepril (LOTENSIN) 5 MG tablet Take 1 tablet (5 mg total) by mouth daily.   diclofenac Sodium (VOLTAREN) 1 % GEL Apply 4 g topically 4 (four) times daily.   etodolac (LODINE) 400 MG tablet Take 1 tablet (400 mg total) by mouth 2 (two) times daily as needed for mild pain or moderate pain.   gabapentin (NEURONTIN) 100 MG capsule Take 1 to 2 capsules po QPM for RLS   ibuprofen (ADVIL) 600 MG tablet Take 1 tablet (600 mg total) by mouth every 8 (eight) hours as needed.   meclizine (ANTIVERT) 25 MG tablet Take 1 tablet (25 mg total) by mouth 3 (three) times daily as needed for dizziness.   nitrofurantoin (MACRODANTIN) 100 MG capsule Take 1 capsule (100 mg total) by mouth every Monday, Wednesday, and Friday.   rOPINIRole (REQUIP) 1 MG  tablet Take 1 tablet po QHS prn restless legs .   spironolactone (ALDACTONE) 25 MG tablet Take 1 tablet (25 mg total) by mouth daily.   [DISCONTINUED] ALPRAZolam (XANAX) 0.5 MG tablet TAKE ONE TABLET (0.5 MG) BY MOUTH EVERY DAY AS NEEDED   [START ON 05/06/2022] ALPRAZolam (XANAX) 0.5 MG tablet Take 1 tablet (0.5 mg total) by mouth daily as needed for anxiety.   No facility-administered encounter medications on file as of 05/01/2022.    Surgical History: Past Surgical History:  Procedure Laterality Date   BACK SURGERY     MOHS SURGERY     NECK SURGERY     skin cancer removed  12/14/2020   TONSILLECTOMY     WRIST SURGERY Right     Medical History: Past Medical History:  Diagnosis Date   Allergy    Anxiety    Arthritis    Cancer ([place])    skin   Depression    GERD (gastroesophageal reflux disease)    Hypertension    Migraines     Family History: Family History  Problem Relation Age of Onset   Congestive Heart Failure Mother    COPD Mother    Emphysema Mother    Hypertension Mother    Kidney disease Mother    Cancer Father    Kidney disease Father    Bladder Cancer Neg Hx  Social History   Socioeconomic History   Marital status: Divorced    Spouse name: Not on file   Number of children: Not on file   Years of education: Not on file   Highest education level: Not on file  Occupational History   Not on file  Tobacco Use   Smoking status: Former    Types: Cigarettes   Smokeless tobacco: Never  Vaping Use   Vaping Use: Every day  Substance and Sexual Activity   Alcohol use: No   Drug use: No   Sexual activity: Not Currently  Other Topics Concern   Not on file  Social History Narrative   Not on file   Social Determinants of Health   Financial Resource Strain: Not on file  Food Insecurity: Not on file  Transportation Needs: Not on file  Physical Activity: Not on file  Stress: Not on file  Social Connections: Not on file  Intimate Partner Violence:  Not on file      Review of Systems  Constitutional:  Negative for chills, fatigue and unexpected weight change.  HENT:  Negative for congestion, rhinorrhea, sneezing and sore throat.   Eyes:  Negative for redness.  Respiratory:  Negative for cough, chest tightness and shortness of breath.   Cardiovascular:  Negative for chest pain and palpitations.  Gastrointestinal:  Negative for abdominal pain, constipation, diarrhea, nausea and vomiting.  Genitourinary:  Negative for dysuria and frequency.  Musculoskeletal:  Negative for arthralgias, back pain, joint swelling and neck pain.  Skin:  Negative for rash.  Neurological: Negative.  Negative for tremors and numbness.  Hematological:  Negative for adenopathy. Does not bruise/bleed easily.  Psychiatric/Behavioral:  Negative for behavioral problems (Depression), sleep disturbance and suicidal ideas. The patient is not nervous/anxious.    Vital Signs: BP 140/63   Pulse 60   Temp 98.6 F (37 C)   Resp 16   Ht 5' 7.5" (1.715 m)   Wt 123 lb 9.6 oz (56.1 kg)   SpO2 97%   BMI 19.07 kg/m    Physical Exam Vitals reviewed.  Constitutional:      General: She is not in acute distress.    Appearance: Normal appearance. She is normal weight. She is not ill-appearing.  HENT:     Head: Normocephalic and atraumatic.  Eyes:     Pupils: Pupils are equal, round, and reactive to light.  Cardiovascular:     Rate and Rhythm: Normal rate and regular rhythm.  Pulmonary:     Effort: Pulmonary effort is normal. No respiratory distress.  Neurological:     Mental Status: She is alert and oriented to person, place, and time.  Psychiatric:        Mood and Affect: Mood normal.        Behavior: Behavior normal.       Assessment/Plan: 1. Essential hypertension Stable and well-controlled on current medications, routine labs ordered. - CBC with Differential/Platelet  2. Prediabetes Prior elevated A1c, was 5.8 in 2019, will check A1c with routine  labs for annual wellness visit in August.   - CBC with Differential/Platelet - CMP14+EGFR - Hgb A1C w/o eAG  3. Mixed hyperlipidemia Routine labs ordered - CBC with Differential/Platelet - Lipid Profile  4. Vitamin D deficiency Routine labs ordered - CBC with Differential/Platelet - Vitamin D (25 hydroxy)  5. B12 deficiency Routine lab ordered - B12 and Folate Panel  6. Generalized anxiety disorder Refills ordered - ALPRAZolam (XANAX) 0.5 MG tablet; Take 1 tablet (0.5 mg  total) by mouth daily as needed for anxiety.  Dispense: 90 tablet; Refill: 0 - CBC with Differential/Platelet   General Counseling: Nashya verbalizes understanding of the findings of todays visit and agrees with plan of treatment. I have discussed any further diagnostic evaluation that may be needed or ordered today. We also reviewed her medications today. she has been encouraged to call the office with any questions or concerns that should arise related to todays visit.    Orders Placed This Encounter  Procedures   CBC with Differential/Platelet   CMP14+EGFR   Lipid Profile   Hgb A1C w/o eAG   Vitamin D (25 hydroxy)   B12 and Folate Panel    Meds ordered this encounter  Medications   ALPRAZolam (XANAX) 0.5 MG tablet    Sig: Take 1 tablet (0.5 mg total) by mouth daily as needed for anxiety.    Dispense:  90 tablet    Refill:  0    Patient requests 90 day supply and this is ok with me. Please send on Monday 05/06/22    Return for previously scheduled, CPE, Lilinoe Acklin PCP and will do addtl alprazolam refills then.   Total time spent:30 Minutes Time spent includes review of chart, medications, test results, and follow up plan with the patient.   [place] Controlled Substance Database was reviewed by me.  This patient was seen by Jonetta Osgood, FNP-C in collaboration with Dr. Clayborn Bigness as a part of collaborative care agreement.   Aloysuis Ribaudo R. Valetta Fuller, MSN, FNP-C Internal medicine

## 2022-05-21 DIAGNOSIS — M9901 Segmental and somatic dysfunction of cervical region: Secondary | ICD-10-CM | POA: Diagnosis not present

## 2022-05-21 DIAGNOSIS — M4306 Spondylolysis, lumbar region: Secondary | ICD-10-CM | POA: Diagnosis not present

## 2022-05-21 DIAGNOSIS — M9903 Segmental and somatic dysfunction of lumbar region: Secondary | ICD-10-CM | POA: Diagnosis not present

## 2022-05-21 DIAGNOSIS — M5412 Radiculopathy, cervical region: Secondary | ICD-10-CM | POA: Diagnosis not present

## 2022-05-28 DIAGNOSIS — M9901 Segmental and somatic dysfunction of cervical region: Secondary | ICD-10-CM | POA: Diagnosis not present

## 2022-05-28 DIAGNOSIS — M4306 Spondylolysis, lumbar region: Secondary | ICD-10-CM | POA: Diagnosis not present

## 2022-05-28 DIAGNOSIS — M5412 Radiculopathy, cervical region: Secondary | ICD-10-CM | POA: Diagnosis not present

## 2022-05-28 DIAGNOSIS — M9903 Segmental and somatic dysfunction of lumbar region: Secondary | ICD-10-CM | POA: Diagnosis not present

## 2022-05-30 ENCOUNTER — Telehealth: Payer: Self-pay

## 2022-05-30 DIAGNOSIS — C44529 Squamous cell carcinoma of skin of other part of trunk: Secondary | ICD-10-CM | POA: Diagnosis not present

## 2022-05-30 DIAGNOSIS — L905 Scar conditions and fibrosis of skin: Secondary | ICD-10-CM | POA: Diagnosis not present

## 2022-05-30 NOTE — Telephone Encounter (Signed)
Pt called that she had procedure today and her BP was high and  they recheck its was ok she was having headache advised her that I gave her appt today she said she can tomorrow  nurse visit and advised her to take her bp med now if its high and also advised if worse go to ED

## 2022-05-31 ENCOUNTER — Encounter: Payer: Self-pay | Admitting: Nurse Practitioner

## 2022-05-31 ENCOUNTER — Ambulatory Visit (INDEPENDENT_AMBULATORY_CARE_PROVIDER_SITE_OTHER): Payer: Medicare Other | Admitting: Nurse Practitioner

## 2022-05-31 VITALS — BP 134/80 | HR 64 | Temp 97.8°F | Resp 16 | Ht 67.5 in | Wt 119.0 lb

## 2022-05-31 DIAGNOSIS — E538 Deficiency of other specified B group vitamins: Secondary | ICD-10-CM

## 2022-05-31 DIAGNOSIS — R7303 Prediabetes: Secondary | ICD-10-CM

## 2022-05-31 DIAGNOSIS — I447 Left bundle-branch block, unspecified: Secondary | ICD-10-CM | POA: Diagnosis not present

## 2022-05-31 DIAGNOSIS — R002 Palpitations: Secondary | ICD-10-CM

## 2022-05-31 DIAGNOSIS — I10 Essential (primary) hypertension: Secondary | ICD-10-CM

## 2022-05-31 DIAGNOSIS — E559 Vitamin D deficiency, unspecified: Secondary | ICD-10-CM | POA: Diagnosis not present

## 2022-05-31 DIAGNOSIS — F411 Generalized anxiety disorder: Secondary | ICD-10-CM

## 2022-05-31 DIAGNOSIS — E782 Mixed hyperlipidemia: Secondary | ICD-10-CM | POA: Diagnosis not present

## 2022-05-31 MED ORDER — AMLODIPINE BESY-BENAZEPRIL HCL 10-20 MG PO CAPS: 1.0000 | ORAL_CAPSULE | Freq: Every day | ORAL | 1 refills | Status: AC

## 2022-05-31 MED ORDER — FUROSEMIDE 20 MG PO TABS: 20.0000 mg | ORAL_TABLET | Freq: Every day | ORAL | 3 refills | Status: DC | PRN

## 2022-06-01 ENCOUNTER — Encounter: Payer: Self-pay | Admitting: Nurse Practitioner

## 2022-06-01 LAB — LIPID PANEL
Chol/HDL Ratio: 3.5 ratio (ref 0.0–4.4)
Cholesterol, Total: 201 mg/dL — ABNORMAL HIGH (ref 100–199)
HDL: 57 mg/dL (ref >39)
LDL Chol Calc (NIH): 129 mg/dL — ABNORMAL HIGH (ref 0–99)
Triglycerides: 86 mg/dL (ref 0–149)
VLDL Cholesterol Cal: 15 mg/dL (ref 5–40)

## 2022-06-01 LAB — CMP14+EGFR
ALT: 12 IU/L (ref 0–32)
AST: 18 IU/L (ref 0–40)
Albumin/Globulin Ratio: 1.8 (ref 1.2–2.2)
Albumin: 4.4 g/dL (ref 3.6–4.6)
Alkaline Phosphatase: 82 IU/L (ref 44–121)
BUN/Creatinine Ratio: 17 (ref 12–28)
BUN: 13 mg/dL (ref 8–27)
Bilirubin Total: 0.4 mg/dL (ref 0.0–1.2)
CO2: 22 mmol/L (ref 20–29)
Calcium: 9.4 mg/dL (ref 8.7–10.3)
Chloride: 99 mmol/L (ref 96–106)
Creatinine, Ser: 0.77 mg/dL (ref 0.57–1.00)
Globulin, Total: 2.5 g/dL (ref 1.5–4.5)
Glucose: 142 mg/dL — ABNORMAL HIGH (ref 70–99)
Potassium: 3.9 mmol/L (ref 3.5–5.2)
Sodium: 137 mmol/L (ref 134–144)
Total Protein: 6.9 g/dL (ref 6.0–8.5)
eGFR: 76 mL/min/{1.73_m2} (ref >59)

## 2022-06-01 LAB — CBC WITH DIFFERENTIAL/PLATELET
Basophils Absolute: 0.1 10*3/uL (ref 0.0–0.2)
Basos: 1 %
EOS (ABSOLUTE): 0.1 10*3/uL (ref 0.0–0.4)
Eos: 2 %
Hematocrit: 42.9 % (ref 34.0–46.6)
Hemoglobin: 14.5 g/dL (ref 11.1–15.9)
Immature Grans (Abs): 0.1 10*3/uL (ref 0.0–0.1)
Immature Granulocytes: 1 %
Lymphocytes Absolute: 1.5 10*3/uL (ref 0.7–3.1)
Lymphs: 20 %
MCH: 31.8 pg (ref 26.6–33.0)
MCHC: 33.8 g/dL (ref 31.5–35.7)
MCV: 94 fL (ref 79–97)
Monocytes Absolute: 0.8 10*3/uL (ref 0.1–0.9)
Monocytes: 10 %
Neutrophils Absolute: 4.9 10*3/uL (ref 1.4–7.0)
Neutrophils: 66 %
Platelets: 283 10*3/uL (ref 150–450)
RBC: 4.56 x10E6/uL (ref 3.77–5.28)
RDW: 12.2 % (ref 11.7–15.4)
WBC: 7.3 10*3/uL (ref 3.4–10.8)

## 2022-06-01 LAB — VITAMIN D 25 HYDROXY (VIT D DEFICIENCY, FRACTURES): Vit D, 25-Hydroxy: 23.5 ng/mL — ABNORMAL LOW (ref 30.0–100.0)

## 2022-06-01 LAB — HGB A1C W/O EAG: Hgb A1c MFr Bld: 5.6 % (ref 4.8–5.6)

## 2022-06-01 LAB — B12 AND FOLATE PANEL
Folate: 18.5 ng/mL (ref >3.0)
Vitamin B-12: 572 pg/mL (ref 232–1245)

## 2022-06-03 ENCOUNTER — Other Ambulatory Visit: Payer: Self-pay | Admitting: Nurse Practitioner

## 2022-06-03 DIAGNOSIS — G629 Polyneuropathy, unspecified: Secondary | ICD-10-CM

## 2022-06-04 DIAGNOSIS — M4306 Spondylolysis, lumbar region: Secondary | ICD-10-CM | POA: Diagnosis not present

## 2022-06-04 DIAGNOSIS — M9903 Segmental and somatic dysfunction of lumbar region: Secondary | ICD-10-CM | POA: Diagnosis not present

## 2022-06-04 DIAGNOSIS — M9901 Segmental and somatic dysfunction of cervical region: Secondary | ICD-10-CM | POA: Diagnosis not present

## 2022-06-04 DIAGNOSIS — M5412 Radiculopathy, cervical region: Secondary | ICD-10-CM | POA: Diagnosis not present

## 2022-06-12 DIAGNOSIS — M4306 Spondylolysis, lumbar region: Secondary | ICD-10-CM | POA: Diagnosis not present

## 2022-06-12 DIAGNOSIS — M5412 Radiculopathy, cervical region: Secondary | ICD-10-CM | POA: Diagnosis not present

## 2022-06-12 DIAGNOSIS — M9901 Segmental and somatic dysfunction of cervical region: Secondary | ICD-10-CM | POA: Diagnosis not present

## 2022-06-12 DIAGNOSIS — M9903 Segmental and somatic dysfunction of lumbar region: Secondary | ICD-10-CM | POA: Diagnosis not present

## 2022-06-18 DIAGNOSIS — M9901 Segmental and somatic dysfunction of cervical region: Secondary | ICD-10-CM | POA: Diagnosis not present

## 2022-06-18 DIAGNOSIS — M9903 Segmental and somatic dysfunction of lumbar region: Secondary | ICD-10-CM | POA: Diagnosis not present

## 2022-06-18 DIAGNOSIS — M5412 Radiculopathy, cervical region: Secondary | ICD-10-CM | POA: Diagnosis not present

## 2022-06-18 DIAGNOSIS — M4306 Spondylolysis, lumbar region: Secondary | ICD-10-CM | POA: Diagnosis not present

## 2022-07-02 DIAGNOSIS — M4306 Spondylolysis, lumbar region: Secondary | ICD-10-CM | POA: Diagnosis not present

## 2022-07-02 DIAGNOSIS — M5412 Radiculopathy, cervical region: Secondary | ICD-10-CM | POA: Diagnosis not present

## 2022-07-02 DIAGNOSIS — M9903 Segmental and somatic dysfunction of lumbar region: Secondary | ICD-10-CM | POA: Diagnosis not present

## 2022-07-02 DIAGNOSIS — M9901 Segmental and somatic dysfunction of cervical region: Secondary | ICD-10-CM | POA: Diagnosis not present

## 2022-07-04 ENCOUNTER — Encounter: Payer: Self-pay | Admitting: Nurse Practitioner

## 2022-07-04 ENCOUNTER — Ambulatory Visit (INDEPENDENT_AMBULATORY_CARE_PROVIDER_SITE_OTHER): Payer: Medicare Other | Admitting: Nurse Practitioner

## 2022-07-04 VITALS — BP 124/64 | HR 55 | Temp 98.3°F | Resp 16 | Ht 67.5 in | Wt 120.2 lb

## 2022-07-04 DIAGNOSIS — M5417 Radiculopathy, lumbosacral region: Secondary | ICD-10-CM

## 2022-07-04 DIAGNOSIS — L819 Disorder of pigmentation, unspecified: Secondary | ICD-10-CM | POA: Diagnosis not present

## 2022-07-04 DIAGNOSIS — G629 Polyneuropathy, unspecified: Secondary | ICD-10-CM

## 2022-07-04 DIAGNOSIS — Z87440 Personal history of urinary (tract) infections: Secondary | ICD-10-CM

## 2022-07-04 DIAGNOSIS — I10 Essential (primary) hypertension: Secondary | ICD-10-CM

## 2022-07-04 DIAGNOSIS — R233 Spontaneous ecchymoses: Secondary | ICD-10-CM

## 2022-07-04 MED ORDER — FUROSEMIDE 20 MG PO TABS: 20.0000 mg | ORAL_TABLET | Freq: Every day | ORAL | 3 refills | Status: AC | PRN

## 2022-07-04 MED ORDER — NITROFURANTOIN MACROCRYSTAL 100 MG PO CAPS: 100.0000 mg | ORAL_CAPSULE | ORAL | 1 refills | Status: AC

## 2022-07-04 MED ORDER — ROPINIROLE HCL 1 MG PO TABS: ORAL_TABLET | ORAL | 1 refills | Status: AC

## 2022-07-04 MED ORDER — DICLOFENAC SODIUM 1 % EX GEL: 4.0000 g | Freq: Four times a day (QID) | CUTANEOUS | 2 refills | Status: AC

## 2022-07-04 NOTE — Progress Notes (Signed)
[facility],  [id number]  Internal MEDICINE  Office Visit Note  Patient Name: Sandra Davidson  [id number]  [id number]  Date of Service: 07/04/2022  Chief Complaint  Patient presents with   Follow-up    Discuss bruises, not taking blood thinners    Depression   Gastroesophageal Reflux   Hypertension   Anxiety   Quality Metric Gaps    Pt had pneumovax    HPI Aloha presents for a follow up visit for hypertension, anxiety and medication review.  At her previous office visit her blood pressure was slightly more elevated than her usual and she had expressed the desire to switch back to her previous blood pressure medication that worked well for her in the past.  She was switched back to her Lotrel at her last office visit and her blood pressure today is improved with her systolic blood pressure improving by 10 mmHg and her diastolic blood pressure improving by approximately 14 mmHg.  Patient is satisfied with this improvement and wants to continue the current medication and dose. It was noted that patient has communicated with [facility] about establishing care.  Patient reports she was wanting to see a specialist about the bruising and discoloration in her legs and arms.  She has previously seen a neurologist and a dermatologist already with no answers.  The provider that she was seeking to establish care with she had thought was a specialist that could help her with this problem but they are a family medicine provider.  Discussed with patient that the bruising and skin discoloration could be further evaluated by hematologist and/or an endocrinologist and that I would be happy to refer her to either or both of those specialists in the [facility] system or [facility] as per patient preference.  Patient reports that she will think about it and call the clinic to let them know her decision if she will be establishing care with a different PCP or if she  would like referrals ordered.      Current Medication: Outpatient Encounter Medications as of 07/04/2022  Medication Sig   ALPRAZolam (XANAX) 0.5 MG tablet Take 1 tablet (0.5 mg total) by mouth daily as needed for anxiety.   amLODipine-benazepril (LOTREL) 10-20 MG capsule Take 1 capsule by mouth daily.   etodolac (LODINE) 400 MG tablet Take 1 tablet (400 mg total) by mouth 2 (two) times daily as needed for mild pain or moderate pain.   gabapentin (NEURONTIN) 100 MG capsule TAKE 1 TO 2 CAPSULES BY MOUTH EVERY EVENING   ibuprofen (ADVIL) 600 MG tablet Take 1 tablet (600 mg total) by mouth every 8 (eight) hours as needed.   meclizine (ANTIVERT) 25 MG tablet Take 1 tablet (25 mg total) by mouth 3 (three) times daily as needed for dizziness.   [DISCONTINUED] benazepril (LOTENSIN) 5 MG tablet Take 1 tablet (5 mg total) by mouth daily.   [DISCONTINUED] diclofenac Sodium (VOLTAREN) 1 % GEL Apply 4 g topically 4 (four) times daily.   [DISCONTINUED] furosemide (LASIX) 20 MG tablet Take 1 tablet (20 mg total) by mouth daily as needed. Take a 1 OTC potassium tablet with the furosemide when you take it.   [DISCONTINUED] nitrofurantoin (MACRODANTIN) 100 MG capsule Take 1 capsule (100 mg total) by mouth every Monday, Wednesday, and Friday.   [DISCONTINUED] rOPINIRole (REQUIP) 1 MG tablet Take 1 tablet po QHS prn restless legs .   [DISCONTINUED] spironolactone (ALDACTONE) 25 MG tablet Take 1  tablet (25 mg total) by mouth daily.   diclofenac Sodium (VOLTAREN) 1 % GEL Apply 4 g topically 4 (four) times daily.   furosemide (LASIX) 20 MG tablet Take 1 tablet (20 mg total) by mouth daily as needed for fluid or edema. Take a 1 OTC potassium tablet with the furosemide when you take it.   nitrofurantoin (MACRODANTIN) 100 MG capsule Take 1 capsule (100 mg total) by mouth every Monday, Wednesday, and Friday.   rOPINIRole (REQUIP) 1 MG tablet Take 1 tablet po QHS prn restless legs .   No facility-administered  encounter medications on file as of 07/04/2022.    Surgical History: Past Surgical History:  Procedure Laterality Date   BACK SURGERY     MOHS SURGERY     NECK SURGERY     skin cancer removed  12/14/2020   TONSILLECTOMY     WRIST SURGERY Right     Medical History: Past Medical History:  Diagnosis Date   Allergy    Anxiety    Arthritis    Cancer ([place])    skin   Depression    GERD (gastroesophageal reflux disease)    Hypertension    Migraines     Family History: Family History  Problem Relation Age of Onset   Congestive Heart Failure Mother    COPD Mother    Emphysema Mother    Hypertension Mother    Kidney disease Mother    Cancer Father    Kidney disease Father    Bladder Cancer Neg Hx     Social History   Socioeconomic History   Marital status: Divorced    Spouse name: Not on file   Number of children: Not on file   Years of education: Not on file   Highest education level: Not on file  Occupational History   Not on file  Tobacco Use   Smoking status: Former    Types: Cigarettes   Smokeless tobacco: Never  Vaping Use   Vaping Use: Every day  Substance and Sexual Activity   Alcohol use: No   Drug use: No   Sexual activity: Not Currently  Other Topics Concern   Not on file  Social History Narrative   Not on file   Social Determinants of Health   Financial Resource Strain: Not on file  Food Insecurity: Not on file  Transportation Needs: Not on file  Physical Activity: Not on file  Stress: Not on file  Social Connections: Not on file  Intimate Partner Violence: Not on file      Review of Systems  Constitutional:  Negative for chills, fatigue and unexpected weight change.  HENT:  Negative for congestion, rhinorrhea, sneezing and sore throat.   Eyes:  Negative for redness.  Respiratory:  Negative for cough, chest tightness and shortness of breath.   Cardiovascular:  Negative for chest pain and palpitations.  Gastrointestinal:  Negative  for abdominal pain, constipation, diarrhea, nausea and vomiting.  Genitourinary:  Negative for dysuria and frequency.  Musculoskeletal:  Negative for arthralgias, back pain, joint swelling and neck pain.  Skin:  Negative for rash.  Neurological: Negative.  Negative for tremors and numbness.  Hematological:  Negative for adenopathy. Does not bruise/bleed easily.  Psychiatric/Behavioral:  Negative for behavioral problems (Depression), sleep disturbance and suicidal ideas. The patient is not nervous/anxious.     Vital Signs: BP 124/64   Pulse (!) 55   Temp 98.3 F (36.8 C)   Resp 16   Ht 5' 7.5" (1.715 m)  Wt 120 lb 3.2 oz (54.5 kg)   SpO2 98%   BMI 18.55 kg/m    Physical Exam Vitals reviewed.  Constitutional:      General: She is not in acute distress.    Appearance: Normal appearance. She is normal weight. She is not ill-appearing.  HENT:     Head: Normocephalic and atraumatic.  Eyes:     Pupils: Pupils are equal, round, and reactive to light.  Cardiovascular:     Rate and Rhythm: Normal rate and regular rhythm.  Pulmonary:     Effort: Pulmonary effort is normal. No respiratory distress.  Neurological:     Mental Status: She is alert and oriented to person, place, and time.  Psychiatric:        Mood and Affect: Mood normal.        Behavior: Behavior normal.        Assessment/Plan: 1. Essential hypertension Stable, continue current medications, refills ordered.  - furosemide (LASIX) 20 MG tablet; Take 1 tablet (20 mg total) by mouth daily as needed for fluid or edema. Take a 1 OTC potassium tablet with the furosemide when you take it.  Dispense: 90 tablet; Refill: 3  2. Lumbosacral radiculopathy due to intervertebral disc disorder Stable, refils ordered - diclofenac Sodium (VOLTAREN) 1 % GEL; Apply 4 g topically 4 (four) times daily.  Dispense: 700 g; Refill: 2  3. History of recurrent UTIs Refills ordered.  - nitrofurantoin (MACRODANTIN) 100 MG capsule; Take 1  capsule (100 mg total) by mouth every Monday, Wednesday, and Friday.  Dispense: 45 capsule; Refill: 1  4. Neuropathy Refills ordered - rOPINIRole (REQUIP) 1 MG tablet; Take 1 tablet po QHS prn restless legs .  Dispense: 90 tablet; Refill: 1  5. Easy bruisability Informed patient that a referral can be ordered for hematology to further evaluate the easy bruising.  She has already been evaluated by neurology and the dermatologist.  Patient did not want to have the referrals ordered at this time and stated that she would call the clinic if she decides she would like the referral.  6. Hyperpigmentation of skin Patient has hyperpigmentation of the skin on her lower extremities especially the left lower leg.  She has already been seen by neurology and a dermatologist.  I informed the patient that another specialist that might be able to provide some insight and possible causes of the hyperpigmentation would be endocrinology and offered to order a referral to [facility] endocrinology for the patient.  She declined the order for the referral at this time and stated she would call the clinic if she decides she would like to have the referral ordered.   General Counseling: Everlean verbalizes understanding of the findings of todays visit and agrees with plan of treatment. I have discussed any further diagnostic evaluation that may be needed or ordered today. We also reviewed her medications today. she has been encouraged to call the office with any questions or concerns that should arise related to todays visit.    No orders of the defined types were placed in this encounter.   Meds ordered this encounter  Medications   furosemide (LASIX) 20 MG tablet    Sig: Take 1 tablet (20 mg total) by mouth daily as needed for fluid or edema. Take a 1 OTC potassium tablet with the furosemide when you take it.    Dispense:  90 tablet    Refill:  3    Please discontinue previous orders and fill as a 90  day prescription    diclofenac Sodium (VOLTAREN) 1 % GEL    Sig: Apply 4 g topically 4 (four) times daily.    Dispense:  700 g    Refill:  2   nitrofurantoin (MACRODANTIN) 100 MG capsule    Sig: Take 1 capsule (100 mg total) by mouth every Monday, Wednesday, and Friday.    Dispense:  45 capsule    Refill:  1   rOPINIRole (REQUIP) 1 MG tablet    Sig: Take 1 tablet po QHS prn restless legs .    Dispense:  90 tablet    Refill:  1    For future refills    Return in about 1 month (around 08/04/2022) for previously scheduled alprazolam refills. .   Total time spent:30 Minutes Time spent includes review of chart, medications, test results, and follow up plan with the patient.   [place] Controlled Substance Database was reviewed by me.  This patient was seen by Jonetta Osgood, FNP-C in collaboration with Dr. Clayborn Bigness as a part of collaborative care agreement.   Miel Wisener R. Valetta Fuller, MSN, FNP-C Internal medicine

## 2022-07-05 ENCOUNTER — Encounter: Payer: Self-pay | Admitting: Nurse Practitioner

## 2022-07-16 DIAGNOSIS — M9903 Segmental and somatic dysfunction of lumbar region: Secondary | ICD-10-CM | POA: Diagnosis not present

## 2022-07-16 DIAGNOSIS — M9901 Segmental and somatic dysfunction of cervical region: Secondary | ICD-10-CM | POA: Diagnosis not present

## 2022-07-16 DIAGNOSIS — M4306 Spondylolysis, lumbar region: Secondary | ICD-10-CM | POA: Diagnosis not present

## 2022-07-16 DIAGNOSIS — M5412 Radiculopathy, cervical region: Secondary | ICD-10-CM | POA: Diagnosis not present

## 2022-07-17 ENCOUNTER — Ambulatory Visit: Payer: Medicare Other | Admitting: Nurse Practitioner

## 2022-07-18 DIAGNOSIS — Z0000 Encounter for general adult medical examination without abnormal findings: Secondary | ICD-10-CM | POA: Diagnosis not present

## 2022-07-18 DIAGNOSIS — G629 Polyneuropathy, unspecified: Secondary | ICD-10-CM | POA: Diagnosis not present

## 2022-07-18 DIAGNOSIS — Z1283 Encounter for screening for malignant neoplasm of skin: Secondary | ICD-10-CM | POA: Diagnosis not present

## 2022-07-18 DIAGNOSIS — M170 Bilateral primary osteoarthritis of knee: Secondary | ICD-10-CM | POA: Diagnosis not present

## 2022-07-18 DIAGNOSIS — N390 Urinary tract infection, site not specified: Secondary | ICD-10-CM | POA: Diagnosis not present

## 2022-07-18 DIAGNOSIS — Z7689 Persons encountering health services in other specified circumstances: Secondary | ICD-10-CM | POA: Diagnosis not present

## 2022-07-18 DIAGNOSIS — G2581 Restless legs syndrome: Secondary | ICD-10-CM | POA: Diagnosis not present

## 2022-07-18 DIAGNOSIS — I10 Essential (primary) hypertension: Secondary | ICD-10-CM | POA: Diagnosis not present

## 2022-07-22 ENCOUNTER — Ambulatory Visit: Payer: Medicare Other | Admitting: Nurse Practitioner

## 2022-07-23 DIAGNOSIS — M4306 Spondylolysis, lumbar region: Secondary | ICD-10-CM | POA: Diagnosis not present

## 2022-07-23 DIAGNOSIS — M5412 Radiculopathy, cervical region: Secondary | ICD-10-CM | POA: Diagnosis not present

## 2022-07-23 DIAGNOSIS — M9903 Segmental and somatic dysfunction of lumbar region: Secondary | ICD-10-CM | POA: Diagnosis not present

## 2022-07-23 DIAGNOSIS — M9901 Segmental and somatic dysfunction of cervical region: Secondary | ICD-10-CM | POA: Diagnosis not present

## 2022-07-26 ENCOUNTER — Ambulatory Visit: Payer: Medicare Other | Admitting: Nurse Practitioner

## 2022-08-06 DIAGNOSIS — M9903 Segmental and somatic dysfunction of lumbar region: Secondary | ICD-10-CM | POA: Diagnosis not present

## 2022-08-06 DIAGNOSIS — M5412 Radiculopathy, cervical region: Secondary | ICD-10-CM | POA: Diagnosis not present

## 2022-08-06 DIAGNOSIS — M9901 Segmental and somatic dysfunction of cervical region: Secondary | ICD-10-CM | POA: Diagnosis not present

## 2022-08-06 DIAGNOSIS — M4306 Spondylolysis, lumbar region: Secondary | ICD-10-CM | POA: Diagnosis not present

## 2022-08-15 DIAGNOSIS — H353132 Nonexudative age-related macular degeneration, bilateral, intermediate dry stage: Secondary | ICD-10-CM | POA: Diagnosis not present

## 2022-08-18 ENCOUNTER — Emergency Department
Admission: EM | Admit: 2022-08-18 | Discharge: 2022-08-18 | Disposition: A | Discharge status: Home / Self Care | Payer: Medicare Other | Attending: Emergency Medicine | Admitting: Emergency Medicine

## 2022-08-18 ENCOUNTER — Other Ambulatory Visit: Payer: Self-pay

## 2022-08-18 ENCOUNTER — Emergency Department: Payer: Medicare Other

## 2022-08-18 DIAGNOSIS — R29818 Other symptoms and signs involving the nervous system: Secondary | ICD-10-CM | POA: Diagnosis not present

## 2022-08-18 DIAGNOSIS — I10 Essential (primary) hypertension: Secondary | ICD-10-CM | POA: Diagnosis not present

## 2022-08-18 DIAGNOSIS — R42 Dizziness and giddiness: Secondary | ICD-10-CM | POA: Insufficient documentation

## 2022-08-18 DIAGNOSIS — I959 Hypotension, unspecified: Secondary | ICD-10-CM | POA: Diagnosis not present

## 2022-08-18 LAB — CBC WITH DIFFERENTIAL/PLATELET
Abs Immature Granulocytes: 0.02 10*3/uL (ref 0.00–0.07)
Basophils Absolute: 0 10*3/uL (ref 0.0–0.1)
Basophils Relative: 1 %
Eosinophils Absolute: 0.2 10*3/uL (ref 0.0–0.5)
Eosinophils Relative: 3 %
HCT: 38.2 % (ref 36.0–46.0)
Hemoglobin: 12.3 g/dL (ref 12.0–15.0)
Immature Granulocytes: 0 %
Lymphocytes Relative: 30 %
Lymphs Abs: 2.1 10*3/uL (ref 0.7–4.0)
MCH: 30.8 pg (ref 26.0–34.0)
MCHC: 32.2 g/dL (ref 30.0–36.0)
MCV: 95.5 fL (ref 80.0–100.0)
Monocytes Absolute: 0.7 10*3/uL (ref 0.1–1.0)
Monocytes Relative: 10 %
Neutro Abs: 3.8 10*3/uL (ref 1.7–7.7)
Neutrophils Relative %: 56 %
Platelets: 181 10*3/uL (ref 150–400)
RBC: 4 MIL/uL (ref 3.87–5.11)
RDW: 12.5 % (ref 11.5–15.5)
WBC: 6.8 10*3/uL (ref 4.0–10.5)
nRBC: 0 % (ref 0.0–0.2)

## 2022-08-18 LAB — COMPREHENSIVE METABOLIC PANEL
ALT: 10 U/L (ref 0–44)
AST: 19 U/L (ref 15–41)
Albumin: 3.9 g/dL (ref 3.5–5.0)
Alkaline Phosphatase: 71 U/L (ref 38–126)
Anion gap: 8 (ref 5–15)
BUN: 28 mg/dL — ABNORMAL HIGH (ref 8–23)
CO2: 26 mmol/L (ref 22–32)
Calcium: 9 mg/dL (ref 8.9–10.3)
Chloride: 107 mmol/L (ref 98–111)
Creatinine, Ser: 0.94 mg/dL (ref 0.44–1.00)
GFR, Estimated: 60 mL/min — ABNORMAL LOW (ref >60)
Glucose, Bld: 111 mg/dL — ABNORMAL HIGH (ref 70–99)
Potassium: 3.5 mmol/L (ref 3.5–5.1)
Sodium: 141 mmol/L (ref 135–145)
Total Bilirubin: 0.3 mg/dL (ref 0.3–1.2)
Total Protein: 6.8 g/dL (ref 6.5–8.1)

## 2022-08-18 LAB — TROPONIN I (HIGH SENSITIVITY): Troponin I (High Sensitivity): 7 ng/L (ref <18)

## 2022-08-18 MED ORDER — MECLIZINE HCL 12.5 MG PO TABS: 12.5000 mg | ORAL_TABLET | Freq: Three times a day (TID) | ORAL | 0 refills | Status: AC | PRN

## 2022-08-18 MED ORDER — MECLIZINE HCL 25 MG PO TABS
25.0000 mg | ORAL_TABLET | Freq: Once | ORAL | Status: AC
  Administered 2022-08-18: 25 mg via ORAL
  Filled 2022-08-18: qty 1

## 2022-08-18 NOTE — Discharge Instructions (Signed)
The MRI and CAT scan today were normal.  I will give you some meclizine or Antivert.  You can take up to 1 pill 3 times a day as needed for dizziness.  Please do not hesitate to return if the dizziness comes back especially if you have trouble walking.  Just call the ambulance and they can bring you back.  Please follow-up with your regular doctor later on this week.  I will also give you a referral for neurology.  You can call them up and have them continue to evaluate you.

## 2022-08-18 NOTE — ED Notes (Signed)
Pt reports some relief from dizziness.

## 2022-08-18 NOTE — ED Notes (Signed)
Pt feels safe for DC, family to lobby to drive home

## 2022-08-18 NOTE — ED Provider Notes (Signed)
[facility] Provider Note   Event Date/Time   First MD Initiated Contact with Patient 08/18/22 1401     (approximate) History  Dizziness  HPI Sandra Davidson is a 86 y.o. female with a history of hypertension, prediabetes, restless leg syndrome, and generalized anxiety disorder who presents for symptoms of dizziness that began this morning.  Patient states that she tried to take her Antivert that she has had for vertigo in the past however when she woke up from a nap symptoms did not improve.  Patient states that she took all her other medications on time and as prescribed ROS: Patient currently denies any vision changes, tinnitus, difficulty speaking, facial droop, sore throat, chest pain, shortness of breath, abdominal pain, nausea/vomiting/diarrhea, dysuria, or weakness/numbness/paresthesias in any extremity   Physical Exam  Triage Vital Signs: ED Triage Vitals  Enc Vitals Group     BP 08/18/22 1405 (!) 121/56     Pulse Rate 08/18/22 1405 70     Resp 08/18/22 1405 20     Temp 08/18/22 1359 98 F (36.7 C)     Temp Source 08/18/22 1359 Oral     SpO2 08/18/22 1405 96 %     Weight --      Height --      Head Circumference --      Peak Flow --      Pain Score --      Pain Loc --      Pain Edu? --      Excl. in [place]? --    Most recent vital signs: Vitals:   08/18/22 1405 08/18/22 1405  BP: (!) 121/56   Pulse: 70   Resp: 20   Temp:  97.9 F (36.6 C)  SpO2: 96%    General: Awake, oriented x4. CV:  Good peripheral perfusion.  Resp:  Normal effort.  Abd:  No distention.  Other:  Elderly Caucasian female laying in bed in no acute distress. HINNTS exam equivocal for peripheral versus central ED Results / Procedures / Treatments  Labs (all labs ordered are listed, but only abnormal results are displayed) Labs Reviewed  COMPREHENSIVE METABOLIC PANEL - Abnormal; Notable for the following components:      Result Value   Glucose, Bld 111 (*)    BUN 28  (*)    GFR, Estimated 60 (*)    All other components within normal limits  CBC WITH DIFFERENTIAL/PLATELET  URINALYSIS, ROUTINE W REFLEX MICROSCOPIC  TROPONIN I (HIGH SENSITIVITY)  TROPONIN I (HIGH SENSITIVITY)   EKG ED ECG REPORT I, Naaman Plummer, the attending physician, personally viewed and interpreted this ECG. Date: 08/18/2022 EKG Time: 1402 Rate: 65 Rhythm: normal sinus rhythm QRS Axis: normal Intervals: LBBB ST/T Wave abnormalities: normal Narrative Interpretation: Normal sinus rhythm with LBBB.  No evidence of acute ischemia RADIOLOGY ED MD interpretation: CT of the head without contrast interpreted by me shows no evidence of acute abnormalities including no intracerebral hemorrhage, obvious masses, or significant edema -Agree with radiology assessment Official radiology report(s): CT Head Wo Contrast  Result Date: 08/18/2022 CLINICAL DATA:  Dizziness. Neuro deficit, acute, stroke suspected. EXAM: CT HEAD WITHOUT CONTRAST TECHNIQUE: Contiguous axial images were obtained from the base of the skull through the vertex without intravenous contrast. RADIATION DOSE REDUCTION: This exam was performed according to the departmental dose-optimization program which includes automated exposure control, adjustment of the mA and/or kV according to patient size and/or use of iterative reconstruction technique. COMPARISON:  CT head  without contrast 08/14/2019 FINDINGS: Brain: Mild atrophy and white matter changes are similar the prior exam. No acute infarct, hemorrhage, or mass lesion is present. The ventricles are of normal size. No significant extraaxial fluid collection is present. Basal ganglia are within normal limits. Insular ribbon is normal. The brainstem and cerebellum are within normal limits. Vascular: Atherosclerotic calcifications are again noted within the cavernous internal carotid arteries bilaterally. No hyperdense vessel is present. Skull: Calvarium is intact. No focal lytic or  blastic lesions are present. No significant extracranial soft tissue lesion is present. Sinuses/Orbits: The paranasal sinuses and mastoid air cells are clear. Bilateral lens replacements are noted. Globes and orbits are otherwise unremarkable. IMPRESSION: 1. Stable mild atrophy and white matter disease. This likely reflects the sequela of chronic microvascular ischemia. 2. No acute intracranial abnormality or significant interval change. Electronically Signed   By: San Morelle M.D.   On: 08/18/2022 14:34   PROCEDURES: Critical Care performed: No Procedures MEDICATIONS ORDERED IN ED: Medications  meclizine (ANTIVERT) tablet 25 mg (25 mg Oral Given 08/18/22 1455)   IMPRESSION / MDM / ASSESSMENT AND PLAN / ED COURSE  I reviewed the triage vital signs and the nursing notes.                             Differential diagnosis includes, but is not limited to, CVA, labyrinthitis, BPPV The patient is on the cardiac monitor to evaluate for evidence of arrhythmia and/or significant heart rate changes. Patient's presentation is most consistent with acute presentation with potential threat to life or bodily function. Patient is an 86 year old female with the above-stated past medical history who presents for intermittent dizziness Laboratory evaluation does not show any significant abnormalities CT of the head does not show any evidence of acute abnormalities Patient pending MRI brain Care of this patient will be signed out to the oncoming physician at the end of my shift.  All pertinent patient information conveyed and all questions answered.  All further care and disposition decisions will be made by the oncoming physician.   FINAL CLINICAL IMPRESSION(S) / ED DIAGNOSES   Final diagnoses:  Dizziness   Rx / DC Orders   ED Discharge Orders     None      Note:  This document was prepared using Dragon voice recognition software and may include unintentional dictation errors.   Naaman Plummer, MD 08/18/22 [phone]

## 2022-08-18 NOTE — ED Provider Notes (Signed)
MRI of the brain is negative as per our discussion we will discharge the patient with some meclizine which she has had before.  Patient is doing well.  Currently having no symptoms I will give her some more meclizine as hers is somewhat out of date.   Nena Polio, MD 08/18/22 [phone]

## 2022-08-18 NOTE — ED Triage Notes (Addendum)
Pt reports going to bathroom this AM approx 0400. Took her PO antivert for mild dizziness. Ate breakfast, went back to bed and was still dizzy upon waking. Pt states dizziness has not stopped, feels like the TV is moving all around. Pt alert and oriented states took her htn meds this am. Will monitor.

## 2022-08-28 DIAGNOSIS — R42 Dizziness and giddiness: Secondary | ICD-10-CM | POA: Diagnosis not present

## 2022-09-03 DIAGNOSIS — M9903 Segmental and somatic dysfunction of lumbar region: Secondary | ICD-10-CM | POA: Diagnosis not present

## 2022-09-03 DIAGNOSIS — M9901 Segmental and somatic dysfunction of cervical region: Secondary | ICD-10-CM | POA: Diagnosis not present

## 2022-09-03 DIAGNOSIS — M4306 Spondylolysis, lumbar region: Secondary | ICD-10-CM | POA: Diagnosis not present

## 2022-09-03 DIAGNOSIS — M5412 Radiculopathy, cervical region: Secondary | ICD-10-CM | POA: Diagnosis not present

## 2022-09-06 DIAGNOSIS — M5412 Radiculopathy, cervical region: Secondary | ICD-10-CM | POA: Diagnosis not present

## 2022-09-06 DIAGNOSIS — M9901 Segmental and somatic dysfunction of cervical region: Secondary | ICD-10-CM | POA: Diagnosis not present

## 2022-09-06 DIAGNOSIS — M170 Bilateral primary osteoarthritis of knee: Secondary | ICD-10-CM | POA: Diagnosis not present

## 2022-09-06 DIAGNOSIS — M9903 Segmental and somatic dysfunction of lumbar region: Secondary | ICD-10-CM | POA: Diagnosis not present

## 2022-09-06 DIAGNOSIS — M4306 Spondylolysis, lumbar region: Secondary | ICD-10-CM | POA: Diagnosis not present

## 2022-09-24 DIAGNOSIS — M19031 Primary osteoarthritis, right wrist: Secondary | ICD-10-CM | POA: Diagnosis not present

## 2022-10-11 DIAGNOSIS — G629 Polyneuropathy, unspecified: Secondary | ICD-10-CM | POA: Diagnosis not present

## 2022-10-11 DIAGNOSIS — G2581 Restless legs syndrome: Secondary | ICD-10-CM | POA: Diagnosis not present

## 2022-10-11 DIAGNOSIS — N390 Urinary tract infection, site not specified: Secondary | ICD-10-CM | POA: Diagnosis not present

## 2022-10-11 DIAGNOSIS — I10 Essential (primary) hypertension: Secondary | ICD-10-CM | POA: Diagnosis not present

## 2022-10-21 DIAGNOSIS — H6992 Unspecified Eustachian tube disorder, left ear: Secondary | ICD-10-CM | POA: Diagnosis not present

## 2022-10-21 DIAGNOSIS — I10 Essential (primary) hypertension: Secondary | ICD-10-CM | POA: Diagnosis not present

## 2022-10-21 DIAGNOSIS — G2581 Restless legs syndrome: Secondary | ICD-10-CM | POA: Diagnosis not present

## 2022-10-21 DIAGNOSIS — G629 Polyneuropathy, unspecified: Secondary | ICD-10-CM | POA: Diagnosis not present

## 2022-10-21 DIAGNOSIS — N3281 Overactive bladder: Secondary | ICD-10-CM | POA: Diagnosis not present

## 2022-10-30 DIAGNOSIS — D2261 Melanocytic nevi of right upper limb, including shoulder: Secondary | ICD-10-CM | POA: Diagnosis not present

## 2022-10-30 DIAGNOSIS — D225 Melanocytic nevi of trunk: Secondary | ICD-10-CM | POA: Diagnosis not present

## 2022-10-30 DIAGNOSIS — L578 Other skin changes due to chronic exposure to nonionizing radiation: Secondary | ICD-10-CM | POA: Diagnosis not present

## 2022-10-30 DIAGNOSIS — D2262 Melanocytic nevi of left upper limb, including shoulder: Secondary | ICD-10-CM | POA: Diagnosis not present

## 2022-10-30 DIAGNOSIS — Z08 Encounter for follow-up examination after completed treatment for malignant neoplasm: Secondary | ICD-10-CM | POA: Diagnosis not present

## 2022-10-30 DIAGNOSIS — D2272 Melanocytic nevi of left lower limb, including hip: Secondary | ICD-10-CM | POA: Diagnosis not present

## 2022-10-30 DIAGNOSIS — D485 Neoplasm of uncertain behavior of skin: Secondary | ICD-10-CM | POA: Diagnosis not present

## 2022-10-30 DIAGNOSIS — D2271 Melanocytic nevi of right lower limb, including hip: Secondary | ICD-10-CM | POA: Diagnosis not present

## 2022-10-30 DIAGNOSIS — D0439 Carcinoma in situ of skin of other parts of face: Secondary | ICD-10-CM | POA: Diagnosis not present

## 2022-10-30 DIAGNOSIS — L821 Other seborrheic keratosis: Secondary | ICD-10-CM | POA: Diagnosis not present

## 2022-10-30 DIAGNOSIS — L570 Actinic keratosis: Secondary | ICD-10-CM | POA: Diagnosis not present

## 2022-10-30 DIAGNOSIS — X32XXXA Exposure to sunlight, initial encounter: Secondary | ICD-10-CM | POA: Diagnosis not present

## 2022-10-30 DIAGNOSIS — Z85828 Personal history of other malignant neoplasm of skin: Secondary | ICD-10-CM | POA: Diagnosis not present

## 2022-11-13 ENCOUNTER — Other Ambulatory Visit: Payer: Self-pay | Admitting: Otolaryngology

## 2022-11-13 DIAGNOSIS — J3489 Other specified disorders of nose and nasal sinuses: Secondary | ICD-10-CM | POA: Diagnosis not present

## 2022-11-13 DIAGNOSIS — R42 Dizziness and giddiness: Secondary | ICD-10-CM | POA: Diagnosis not present

## 2022-11-13 DIAGNOSIS — H903 Sensorineural hearing loss, bilateral: Secondary | ICD-10-CM | POA: Diagnosis not present

## 2022-11-19 ENCOUNTER — Ambulatory Visit
Admission: RE | Admit: 2022-11-19 | Discharge: 2022-11-19 | Disposition: A | Discharge status: Home / Self Care | Payer: Medicare Other | Source: Ambulatory Visit | Attending: Otolaryngology | Admitting: Otolaryngology

## 2022-11-19 DIAGNOSIS — R42 Dizziness and giddiness: Secondary | ICD-10-CM | POA: Diagnosis not present

## 2022-11-19 DIAGNOSIS — I6523 Occlusion and stenosis of bilateral carotid arteries: Secondary | ICD-10-CM | POA: Diagnosis not present

## 2022-11-29 DIAGNOSIS — R42 Dizziness and giddiness: Secondary | ICD-10-CM | POA: Diagnosis not present

## 2022-12-09 DIAGNOSIS — C3490 Malignant neoplasm of unspecified part of unspecified bronchus or lung: Secondary | ICD-10-CM

## 2022-12-09 HISTORY — DX: Malignant neoplasm of unspecified part of unspecified bronchus or lung: C34.90

## 2022-12-19 DIAGNOSIS — D0439 Carcinoma in situ of skin of other parts of face: Secondary | ICD-10-CM | POA: Diagnosis not present

## 2022-12-20 DIAGNOSIS — D0439 Carcinoma in situ of skin of other parts of face: Secondary | ICD-10-CM | POA: Diagnosis not present

## 2022-12-25 DIAGNOSIS — R42 Dizziness and giddiness: Secondary | ICD-10-CM | POA: Diagnosis not present

## 2022-12-25 DIAGNOSIS — H903 Sensorineural hearing loss, bilateral: Secondary | ICD-10-CM | POA: Diagnosis not present

## 2022-12-30 NOTE — Therapy (Signed)
OUTPATIENT PHYSICAL THERAPY VESTIBULAR EVALUATION     Patient Name: Sandra Davidson MRN: [id number] DOB:04-18-1936, 87 y.o., female Today's Date: 12/31/2022  END OF SESSION:  PT End of Session - 12/31/22 1449     Visit Number 1    Number of Visits 25    Date for PT Re-Evaluation 03/25/23    PT Start Time 1311    PT Stop Time 1416    PT Time Calculation (min) 65 min    Equipment Utilized During Treatment Gait belt    Activity Tolerance Patient tolerated treatment well    Behavior During Therapy WFL for tasks assessed/performed             Past Medical History:  Diagnosis Date   Allergy    Anxiety    Arthritis    Cancer ([place])    skin   Depression    GERD (gastroesophageal reflux disease)    Hypertension    Migraines    Past Surgical History:  Procedure Laterality Date   BACK SURGERY     MOHS SURGERY     NECK SURGERY     skin cancer removed  12/14/2020   TONSILLECTOMY     WRIST SURGERY Right    Patient Active Problem List   Diagnosis Date Noted   Vaginal yeast infection 04/26/2020   Encounter for long-term (current) use of medications 10/27/2019   Acute otitis media 08/25/2019   Cellulitis of right lower extremity without foot 08/02/2019   Impaired fasting glucose 09/03/2018   Lumbosacral radiculopathy due to intervertebral disc disorder 09/03/2018   Primary osteoarthritis of both knees 07/27/2018   Encounter for general adult medical examination with abnormal findings 07/18/2018   Lower extremity pain, diffuse 07/18/2018   Generalized anxiety disorder 07/18/2018   Need for vaccination against Streptococcus pneumoniae using pneumococcal conjugate vaccine 13 07/18/2018   Bradycardia 06/18/2018   LBBB (left bundle branch block) 05/19/2018   Dysuria 04/22/2018   Restless leg syndrome, controlled 04/22/2018   Benign essential HTN 01/08/2018   Vertigo 01/07/2018   Chest pain 01/07/2018   UTI (urinary tract infection) 01/07/2018   Abnormal chest x-ray  01/07/2018   Prediabetes 05/16/2017   Onychomycosis 05/16/2017   Essential hypertension 02/13/2017   Skin cancer 02/13/2017   Anxiety and depression 02/13/2017   Osteoarthritis 02/13/2017   Chronic venous insufficiency 09/26/2016   Lumbar back pain 09/26/2016   Neuropathy 09/26/2016    PCP: None listed REFERRING PROVIDER:   Carloyn Manner, MD    REFERRING DIAG:  R42 (ICD-10-CM) - Dizziness and giddiness  R26.89 (ICD-10-CM) - Imbalance    THERAPY DIAG:  Dizziness and giddiness  Unsteadiness on feet  Difficulty in walking, not elsewhere classified  ONSET DATE: Sept 2023  Rationale for Evaluation and Treatment: Rehabilitation  SUBJECTIVE:   SUBJECTIVE STATEMENT: Pt  is a pleasant 87 yo female presenting for dizziness. She reports onset of symptoms Sept 2023 with getting up to go to the bathroom at night. She felt sick on her stomach, fainted, and fell and she believes she hit her head. She reports at the time her L ear felt "like it was full of water" (continues to have this problem). She took an [employer]. She had a couple more dizzy spells and stomach upset, but no other falls.  She then went to the ED 08/18/2022 because of dizziness. She states they treated her with antivert and sent her home. Imaging was reassuring. She is currently ambulating without an AD but is not confident in her  balance, in part due to neuropathy but also because of her dizziness. She reports "my knees are bad" and that this contributes to impaired mobility as well.  Description of symptoms: dizziness is described as a lightheadedness mostly. She reports one instance of spinning (when she was laying down watching TV). She reports even when she closed her eyes she still felt the TV was moving. The spinning sensation lasted for 10-15 minutes. She has not had the spinning since. Pt reports she used to get migraines, but it has been years (30-40 years) and that at the time she had them fairly often with  severe headaches. She has headaches every now and then but nothing like she used to. Pt's remaining symptoms are describes as a lightheadedness and "kind of" a swimmy-headedness and "getting sick on my stomach."   Pt accompanied by: self  PERTINENT HISTORY:   Per referral the pt is an 87 yo female with recent hx of bilateral sharp, chronic ear pain (1 yr) of gradual onset, L worse than R, worsening, with feelings of fullness and tinnitus of both ears, and vertigo. Pt also with sensorineural hearing loss. PT went to ED 08/18/2022 for vertigo, was prescribed Meclizine. She has not had spinning since, but continues to feel unsteady with lightheaded or swimmy-headed sensation. She is unsure what brings on her symptoms. PMH: arthritis, anxiety, migraines, HTN, depression, skin cancer, allergy, lumbar back pain, chronic venous insufficiency, prediabetes, vertigo, UTI, chest pain, abnormal chest x-ray, restless leg syndrome, lower extremity pain diffuse, generalized anxiety disorder, primary OA of both knees, lumbosacral radiculopathy due to intervertebral disc disorder, bradycardia, LBBB, cellulitis of RLE without foot, acute otitis media, she reports she had low back and neck surgery in her 20s due to degeneration of a vertebra, she reports this procedure was a fusion.  PAIN:  Are you having pain? Yes: NPRS scale: not rated/10 Pain location: Low back Pain description:   Aggravating factors: lifting something heavy Relieving factors: heat, ice  PRECAUTIONS: Fall, does have hx of fusions (cervical spine fusion, and a different procedure for her low back but she is unsure what)  WEIGHT BEARING RESTRICTIONS: No  FALLS: Has patient fallen in last 6 months? Yes. Number of falls 1  LIVING ENVIRONMENT: Lives with: lives alone Lives in: House/apartment Stairs: No Has following equipment at home: Single point cane, Environmental consultant - 2 wheeled, Environmental consultant - 4 wheeled, shower chair, and Ramped entry  PLOF:  Independent  PATIENT GOALS: Get back to dancing, get rid of dizziness  OBJECTIVE:   DIAGNOSTIC FINDINGS:   US Carotid Bilateral 11/19/2022: "RIGHT CAROTID ARTERY: No significant calcifications of the right common carotid artery. Intermediate waveform maintained. Heterogeneous and partially calcified plaque at the right carotid bifurcation. No significant lumen shadowing. Low resistance waveform of the right ICA. No significant tortuosity.   RIGHT VERTEBRAL ARTERY: Antegrade flow with low resistance waveform.   LEFT CAROTID ARTERY: No significant calcifications of the left common carotid artery. Intermediate waveform maintained. Heterogeneous and partially calcified plaque at the left carotid bifurcation without significant lumen shadowing. Low resistance waveform of the left ICA. No significant tortuosity.   LEFT VERTEBRAL ARTERY:  Antegrade flow with low resistance waveform.   IMPRESSION: Color duplex indicates minimal heterogeneous and calcified plaque, with no hemodynamically significant stenosis by duplex criteria in the extracranial cerebrovascular circulation."  MR BRAIN WO Contrast 08/18/2022: "IMPRESSION: Normal MRI appearance of the brain for age. No acute or focal abnormality to explain the patient's symptoms."  CT HEAD WO CONTRAST 08/18/2022: "  IMPRESSION: 1. Stable mild atrophy and white matter disease. This likely reflects the sequela of chronic microvascular ischemia. 2. No acute intracranial abnormality or significant interval change."    COGNITION: Overall cognitive status: Within functional limits for tasks assessed   SENSATION: Pt reports she has neuropathy in LE  EDEMA:  None observed    POSTURE:  increased thoracic kyphosis  Cervical ROM:    Pt observed to have limited cervical ext, reports this is a result from cervical fusion decades ago  STRENGTH:   LOWER EXTREMITY MMT: deferred  BED MOBILITY:  Impaired when pt has symptoms/dizziness,  otherwise indep  TRANSFERS: Assistive device utilized: None  Sit to stand: Complete Independence Stand to sit: Complete Independence   GAIT: Gait pattern: pt ambulated clinic distances without AD. She is unsteady with ambulating with head turns (horizontal>vertical), vertical head turns are somewhat limited due to hx of cervical fusions from decades ago.  FUNCTIONAL TESTS:  Dynamic Gait Index: 19/24  PATIENT SURVEYS:  ABC scale deferred FOTO 54  VESTIBULAR ASSESSMENT:  GENERAL OBSERVATION: reports FOF, uncertain of what brings on her symptoms, impacts ADLs generally when dizziness occurs   SYMPTOM BEHAVIOR:  Subjective history: Pt seen in ED Sept 2023 due to dizziness, was given Meclizine. Imaging reassuring per report. Pt then sent to ENT, found to have hearing loss, pt with chronic B ear pain (worse on L), and sense of fullness in ears with occ ringing. Pt feels things are generally worse on her L side. She has had only 1 recent instance of spinning that lasted for minutes. Other symptoms have been a lightheadedness or swimmy-headedness. Pt can become nauseated and vomit.   Non-Vestibular symptoms: changes in hearing, headaches, tinnitus, nausea/vomiting, and loss of consciousness  Type of dizziness: Spinning/Vertigo, Lightheadedness/Faint, and "Swimmyheaded" and unsteadiness.  Frequency: pt unsure  Duration: spinning lasted for minutes, but pt can be nauseated and feel off for entire day  Aggravating factors:  pt unsure    OCULOMOTOR EXAM:  Ocular Alignment: normal  Ocular ROM: No Limitations  Spontaneous Nystagmus: absent  Gaze-Induced Nystagmus: absent  Smooth Pursuits:  majority of movement smooth, did note 3 saccadic movements with vertical gaze   Saccades: intact  Convergence/Divergence: Sees double at approx 6" from nose   VESTIBULAR - OCULAR REFLEX:   Slow VOR: Normal  Head-Impulse Test:   - Catch up saccade noted 1x with testing of R side  -WNL to L  Dynamic  Visual Acuity: Impaired, change from line 8 to 5    POSITIONAL TESTING:   Roll testing: negative B Dix-Hallpike: negative B      OTHOSTATICS: deferred  FUNCTIONAL GAIT: Dynamic Gait Index: 19/24, greatest deficits with obstacle clearance, gait with head turns, change in gait speed   VESTIBULAR TREATMENT:                                                                                                   DATE:   Seated VORx1 with plain background, horizontal head turn 2x30 sec. Denies dizziness but does report increased blurriness.   Reviewed HEP (see below).  PATIENT  EDUCATION: Education details: exam findings, indications, plan, HEP, post testing precautions Person educated: Patient Education method: Explanation, Demonstration, Tactile cues, Verbal cues, and Handouts Education comprehension: verbalized understanding and returned demonstration  HOME EXERCISE PROGRAM: Access Code: [email or web address] URL: [email or web address] Date: 12/31/2022 Prepared by: Ricard Dillon  Exercises - Seated Gaze Stabilization with Head Rotation  - 1 x daily - 7 x weekly - 3 sets - 2 reps - 30 seconds hold   GOALS: Goals reviewed with patient? Yes  SHORT TERM GOALS: Target date: 02/11/2023   Patient will be independent in home exercise program to improve strength/mobility for better functional independence with ADLs. Baseline: initiated VOR HEP Goal status: INITIAL   LONG TERM GOALS: Target date: 03/25/2023    Patient will increase FOTO score to equal to or greater than  64  to demonstrate  improvement in mobility and quality of life. Baseline: 54 Goal status: INITIAL   2.  Patient will increase ABC scale score >80% to demonstrate better functional mobility and better confidence with ADLs.  Baseline: to be assessed next 1-2 visits Goal status: INITIAL  4.  Patient will increase dynamic gait index score to >21/24 as to demonstrate reduced fall risk and improved dynamic gait balance  for better safety with community/home ambulation.   Baseline: 19/24 Goal status: INITIAL     ASSESSMENT:  CLINICAL IMPRESSION: Patient is a pleasant 87 y.o. female who was seen today for physical therapy evaluation and treatment for dizziness and imbalance. Examination reveals impaired smooth pursuits, R HIT, and convergence. Pt also found to have impaired balance/gait ability AEB performance on DGI. Positional testing was negative bilaterally. Pt's balance impairment also likely impacted by hx of neuropathy and back/LE pain. Further assessment, such as orthostatic testing, to be completed next session. Pt initiated seated VORx1 (horizontal head turns) for HEP. The pt will benefit from further skilled PT to address these deficits in order to improve balance, dizziness and to decrease fall risk.    OBJECTIVE IMPAIRMENTS: Abnormal gait, decreased balance, decreased mobility, difficulty walking, decreased ROM, dizziness, hypomobility, impaired sensation, improper body mechanics, postural dysfunction, and pain.   ACTIVITY LIMITATIONS: lifting, bending, standing, stairs, bed mobility, and locomotion level All activities can become limited when pt dizzy  PARTICIPATION LIMITATIONS: meal prep, cleaning, laundry, shopping, community activity, and yard work  PERSONAL FACTORS: Age, Past/current experiences, Sex, and 3+ comorbidities: PMH: arthritis, anxiety, migraines, HTN, depression, skin cancer, allergy, lumbar back pain, chronic venous insufficiency, prediabetes, vertigo, UTI, chest pain, abnormal chest x-ray, restless leg syndrome, lower extremity pain diffuse, generalized anxiety disorder, primary OA of both knees, lumbosacral radiculopathy due to intervertebral disc disorder, bradycardia, LBBB, cellulitis of RLE without foot, acute otitis media, she reports she had low back and neck surgery in her 20s due to degeneration of a vertebra, she reports this procedure was a fusion.  are also affecting  patient's functional outcome.   REHAB POTENTIAL: Good  CLINICAL DECISION MAKING: Evolving/moderate complexity  EVALUATION COMPLEXITY: Moderate   PLAN:  PT FREQUENCY: 2x/week  PT DURATION: 12 weeks  PLANNED INTERVENTIONS: Therapeutic exercises, Therapeutic activity, Neuromuscular re-education, Balance training, Gait training, Patient/Family education, Self Care, Joint mobilization, Stair training, Vestibular training, Canalith repositioning, Visual/preceptual remediation/compensation, Orthotic/Fit training, DME instructions, Wheelchair mobility training, Spinal mobilization, Cryotherapy, Moist heat, Taping, Ultrasound, Manual therapy, and Re-evaluation  PLAN FOR NEXT SESSION: orthostatics, cervical ROM, M-CTSIB, balance, MMT   Zollie Pee, PT 12/31/2022, 2:50 PM

## 2022-12-31 ENCOUNTER — Ambulatory Visit: Payer: Medicare HMO | Attending: Family Medicine

## 2022-12-31 DIAGNOSIS — R2681 Unsteadiness on feet: Secondary | ICD-10-CM | POA: Insufficient documentation

## 2022-12-31 DIAGNOSIS — R42 Dizziness and giddiness: Secondary | ICD-10-CM | POA: Insufficient documentation

## 2022-12-31 DIAGNOSIS — R262 Difficulty in walking, not elsewhere classified: Secondary | ICD-10-CM | POA: Diagnosis not present

## 2022-12-31 DIAGNOSIS — M6281 Muscle weakness (generalized): Secondary | ICD-10-CM | POA: Insufficient documentation

## 2023-01-06 ENCOUNTER — Ambulatory Visit: Payer: Medicare HMO

## 2023-01-06 DIAGNOSIS — R2681 Unsteadiness on feet: Secondary | ICD-10-CM | POA: Diagnosis not present

## 2023-01-06 DIAGNOSIS — R262 Difficulty in walking, not elsewhere classified: Secondary | ICD-10-CM | POA: Diagnosis not present

## 2023-01-06 DIAGNOSIS — M6281 Muscle weakness (generalized): Secondary | ICD-10-CM | POA: Diagnosis not present

## 2023-01-06 DIAGNOSIS — R42 Dizziness and giddiness: Secondary | ICD-10-CM | POA: Diagnosis not present

## 2023-01-06 NOTE — Therapy (Signed)
OUTPATIENT PHYSICAL THERAPY VESTIBULAR TREATMENT NOTE     Patient Name: Sandra Davidson MRN: [id number] DOB:Apr 21, 1936, 87 y.o., female Today's Date: 01/06/2023  END OF SESSION:  PT End of Session - 01/06/23 1309     Visit Number 2    Number of Visits 25    Date for PT Re-Evaluation 03/25/23    PT Start Time 1101    PT Stop Time 1145    PT Time Calculation (min) 44 min    Equipment Utilized During Treatment Gait belt    Activity Tolerance Patient tolerated treatment well    Behavior During Therapy WFL for tasks assessed/performed              Past Medical History:  Diagnosis Date   Allergy    Anxiety    Arthritis    Cancer ([place])    skin   Depression    GERD (gastroesophageal reflux disease)    Hypertension    Migraines    Past Surgical History:  Procedure Laterality Date   BACK SURGERY     MOHS SURGERY     NECK SURGERY     skin cancer removed  12/14/2020   TONSILLECTOMY     WRIST SURGERY Right    Patient Active Problem List   Diagnosis Date Noted   Vaginal yeast infection 04/26/2020   Encounter for long-term (current) use of medications 10/27/2019   Acute otitis media 08/25/2019   Cellulitis of right lower extremity without foot 08/02/2019   Impaired fasting glucose 09/03/2018   Lumbosacral radiculopathy due to intervertebral disc disorder 09/03/2018   Primary osteoarthritis of both knees 07/27/2018   Encounter for general adult medical examination with abnormal findings 07/18/2018   Lower extremity pain, diffuse 07/18/2018   Generalized anxiety disorder 07/18/2018   Need for vaccination against Streptococcus pneumoniae using pneumococcal conjugate vaccine 13 07/18/2018   Bradycardia 06/18/2018   LBBB (left bundle branch block) 05/19/2018   Dysuria 04/22/2018   Restless leg syndrome, controlled 04/22/2018   Benign essential HTN 01/08/2018   Vertigo 01/07/2018   Chest pain 01/07/2018   UTI (urinary tract infection) 01/07/2018   Abnormal chest x-ray  01/07/2018   Prediabetes 05/16/2017   Onychomycosis 05/16/2017   Essential hypertension 02/13/2017   Skin cancer 02/13/2017   Anxiety and depression 02/13/2017   Osteoarthritis 02/13/2017   Chronic venous insufficiency 09/26/2016   Lumbar back pain 09/26/2016   Neuropathy 09/26/2016    PCP: None listed REFERRING PROVIDER:   Carloyn Manner, MD    REFERRING DIAG:  R42 (ICD-10-CM) - Dizziness and giddiness  R26.89 (ICD-10-CM) - Imbalance    THERAPY DIAG:  Unsteadiness on feet  Muscle weakness (generalized)  ONSET DATE: Sept 2023  Rationale for Evaluation and Treatment: Rehabilitation  SUBJECTIVE:   SUBJECTIVE STATEMENT: Pt reports she has been performing HEP until she gets a little bit dizzy and then she stops. She reports no other instances of dizziness except when she experienced very brief symptoms/feeling faint when she stood up after bending over. She reports no stumbles/falls. Pt reports same aches and pains and no other changes.     Pt accompanied by: self  PERTINENT HISTORY:   Per referral the pt is an 87 yo female with recent hx of bilateral sharp, chronic ear pain (1 yr) of gradual onset, L worse than R, worsening, with feelings of fullness and tinnitus of both ears, and vertigo. Pt also with sensorineural hearing loss. PT went to ED 08/18/2022 for vertigo, was prescribed Meclizine. She has not  had spinning since, but continues to feel unsteady with lightheaded or swimmy-headed sensation. She is unsure what brings on her symptoms. PMH: arthritis, anxiety, migraines, HTN, depression, skin cancer, allergy, lumbar back pain, chronic venous insufficiency, prediabetes, vertigo, UTI, chest pain, abnormal chest x-ray, restless leg syndrome, lower extremity pain diffuse, generalized anxiety disorder, primary OA of both knees, lumbosacral radiculopathy due to intervertebral disc disorder, bradycardia, LBBB, cellulitis of RLE without foot, acute otitis media, she reports  she had low back and neck surgery in her 20s due to degeneration of a vertebra, she reports this procedure was a fusion.  Description of symptoms: dizziness is described as a lightheadedness mostly. She reports one instance of spinning (when she was laying down watching TV). She reports even when she closed her eyes she still felt the TV was moving. The spinning sensation lasted for 10-15 minutes. She has not had the spinning since. Pt reports she used to get migraines, but it has been years (30-40 years) and that at the time she had them fairly often with severe headaches. She has headaches every now and then but nothing like she used to. Pt's remaining symptoms are describes as a lightheadedness and "kind of" a swimmy-headedness and "getting sick on my stomach."  PAIN:  Are you having pain? Yes: NPRS scale: not rated/10 Pain location: Low back Pain description:   Aggravating factors: lifting something heavy Relieving factors: heat, ice  PRECAUTIONS: Fall, does have hx of fusions (cervical spine fusion, and a different procedure for her low back but she is unsure what)  WEIGHT BEARING RESTRICTIONS: No  FALLS: Has patient fallen in last 6 months? Yes. Number of falls 1  LIVING ENVIRONMENT: Lives with: lives alone Lives in: House/apartment Stairs: No Has following equipment at home: Single point cane, Environmental consultant - 2 wheeled, Environmental consultant - 4 wheeled, shower chair, and Ramped entry  PLOF: Independent  PATIENT GOALS: Get back to dancing, get rid of dizziness  OBJECTIVE:   DIAGNOSTIC FINDINGS:   US Carotid Bilateral 11/19/2022: "RIGHT CAROTID ARTERY: No significant calcifications of the right common carotid artery. Intermediate waveform maintained. Heterogeneous and partially calcified plaque at the right carotid bifurcation. No significant lumen shadowing. Low resistance waveform of the right ICA. No significant tortuosity.   RIGHT VERTEBRAL ARTERY: Antegrade flow with low resistance  waveform.   LEFT CAROTID ARTERY: No significant calcifications of the left common carotid artery. Intermediate waveform maintained. Heterogeneous and partially calcified plaque at the left carotid bifurcation without significant lumen shadowing. Low resistance waveform of the left ICA. No significant tortuosity.   LEFT VERTEBRAL ARTERY:  Antegrade flow with low resistance waveform.   IMPRESSION: Color duplex indicates minimal heterogeneous and calcified plaque, with no hemodynamically significant stenosis by duplex criteria in the extracranial cerebrovascular circulation."  MR BRAIN WO Contrast 08/18/2022: "IMPRESSION: Normal MRI appearance of the brain for age. No acute or focal abnormality to explain the patient's symptoms."  CT HEAD WO CONTRAST 08/18/2022: "IMPRESSION: 1. Stable mild atrophy and white matter disease. This likely reflects the sequela of chronic microvascular ischemia. 2. No acute intracranial abnormality or significant interval change."    COGNITION: Overall cognitive status: Within functional limits for tasks assessed   SENSATION: Pt reports she has neuropathy in LE  EDEMA:  None observed    POSTURE:  increased thoracic kyphosis  Cervical ROM:    Pt observed to have limited cervical ext, reports this is a result from cervical fusion decades ago  STRENGTH:   LOWER EXTREMITY MMT:   Taken  01/06/23 MMT: strength grossly 4/5 in BLE  BED MOBILITY:  Impaired when pt has symptoms/dizziness, otherwise indep  TRANSFERS: Assistive device utilized: None  Sit to stand: Complete Independence Stand to sit: Complete Independence   GAIT: Gait pattern: pt ambulated clinic distances without AD. She is unsteady with ambulating with head turns (horizontal>vertical), vertical head turns are somewhat limited due to hx of cervical fusions from decades ago.  FUNCTIONAL TESTS:  Dynamic Gait Index: 19/24  PATIENT SURVEYS:  ABC scale deferred FOTO  54  VESTIBULAR ASSESSMENT:  GENERAL OBSERVATION: reports FOF, uncertain of what brings on her symptoms, impacts ADLs generally when dizziness occurs   SYMPTOM BEHAVIOR:  Subjective history: Pt seen in ED Sept 2023 due to dizziness, was given Meclizine. Imaging reassuring per report. Pt then sent to ENT, found to have hearing loss, pt with chronic B ear pain (worse on L), and sense of fullness in ears with occ ringing. Pt feels things are generally worse on her L side. She has had only 1 recent instance of spinning that lasted for minutes. Other symptoms have been a lightheadedness or swimmy-headedness. Pt can become nauseated and vomit.   Non-Vestibular symptoms: changes in hearing, headaches, tinnitus, nausea/vomiting, and loss of consciousness  Type of dizziness: Spinning/Vertigo, Lightheadedness/Faint, and "Swimmyheaded" and unsteadiness.  Frequency: pt unsure  Duration: spinning lasted for minutes, but pt can be nauseated and feel off for entire day  Aggravating factors:  pt unsure    OCULOMOTOR EXAM:  Ocular Alignment: normal  Ocular ROM: No Limitations  Spontaneous Nystagmus: absent  Gaze-Induced Nystagmus: absent  Smooth Pursuits:  majority of movement smooth, did note 3 saccadic movements with vertical gaze   Saccades: intact  Convergence/Divergence: Sees double at approx 6" from nose   VESTIBULAR - OCULAR REFLEX:   Slow VOR: Normal  Head-Impulse Test:   - Catch up saccade noted 1x with testing of R side  -WNL to L  Dynamic Visual Acuity: Impaired, change from line 8 to 5    POSITIONAL TESTING:   Roll testing: negative B Dix-Hallpike: negative B      OTHOSTATICS:  Taken 01/06/23 Orthostatics- Supine: 126/57 mmHg, HR 59 bpm Seated: 108/49 mmHg, HR 59 bpm Standing: 115/55 mmHg, HR 69 bpm She reports no dizziness/lightheadedness throughout orthostatic testing  FUNCTIONAL GAIT: Dynamic Gait Index: 19/24, greatest deficits with obstacle clearance, gait with head  turns, change in gait speed   VESTIBULAR TREATMENT:                                                                                                   DATE:   Standing VORx1 with plain background, horizontal, vertical head turn 2x30 sec each. Reports no dizziness.  Standing on airex, WBOS,  VORx1 with plain background, horizontal, vertical head turn 2x30 sec each. Reports no dizziness, but does note fatigue felt in BLE.   Orthostatics- Supine: 126/57 mmHg, HR 59 bpm Seated: 108/49 mmHg, HR 59 bpm Standing: 115/55 mmHg, HR 69 bpm She reports no dizziness/lightheadedness throughout orthostatic testing  M-CTSIB - able to complete all but condition 4 Pt then performed EC  on airex 3x30 sec, improvement in sway with each set, pt did widen BOS with 2nd and third sets.   MMT: strength grossly 4/5 in BLE  STS 3x10 rates medium (issued as part of HEP)  PATIENT EDUCATION: Education details: further assessment findings, PT provides BP readings and instructs pt to follow-up with physician, update to HEP Person educated: Patient Education method: Explanation, Demonstration, Tactile cues, Verbal cues, and Handouts Education comprehension: verbalized understanding and returned demonstration  HOME EXERCISE PROGRAM 1.29: Access Code: [email or web address] URL: [email or web address] Date: 01/06/2023 Prepared by: Ricard Dillon  Exercises - Sit to Stand with Counter Support  - 1 x daily - 5 x weekly - 3 sets - 10 reps Access Code: [email or web address] URL: [email or web address] Date: 12/31/2022 Prepared by: Ricard Dillon  Exercises - Seated Gaze Stabilization with Head Rotation  - 1 x daily - 7 x weekly - 3 sets - 2 reps - 30 seconds hold   GOALS: Goals reviewed with patient? Yes  SHORT TERM GOALS: Target date: 02/17/2023   Patient will be independent in home exercise program to improve strength/mobility for better functional independence with ADLs. Baseline: initiated VOR HEP Goal  status: INITIAL   LONG TERM GOALS: Target date: 03/31/2023    Patient will increase FOTO score to equal to or greater than  64  to demonstrate  improvement in mobility and quality of life. Baseline: 54 Goal status: INITIAL   2.  Patient will increase ABC scale score >80% to demonstrate better functional mobility and better confidence with ADLs.  Baseline: to be assessed next 1-2 visits Goal status: INITIAL  4.  Patient will increase dynamic gait index score to >21/24 as to demonstrate reduced fall risk and improved dynamic gait balance for better safety with community/home ambulation.   Baseline: 19/24 Goal status: INITIAL     ASSESSMENT:  CLINICAL IMPRESSION: Pt able to advance VORx1 to standing on firm and compliant surfaces. She reported no dizziness or visual disturbance/blurring with intervention. She had previously experienced dizziness and blurring with seated variation, indicating an improvement. Orthostatics assessed and pt found to have a substantial decrease in BP from seated>supine. PT provided pt with written handout of blood pressures and instructed pt to bring this up with her physician at her upcoming doctor's appointment. Pt verbalized understanding.The pt will benefit from further skilled PT to address these deficits in order to improve balance, dizziness and to decrease fall risk.    OBJECTIVE IMPAIRMENTS: Abnormal gait, decreased balance, decreased mobility, difficulty walking, decreased ROM, dizziness, hypomobility, impaired sensation, improper body mechanics, postural dysfunction, and pain.   ACTIVITY LIMITATIONS: lifting, bending, standing, stairs, bed mobility, and locomotion level All activities can become limited when pt dizzy  PARTICIPATION LIMITATIONS: meal prep, cleaning, laundry, shopping, community activity, and yard work  PERSONAL FACTORS: Age, Past/current experiences, Sex, and 3+ comorbidities: PMH: arthritis, anxiety, migraines, HTN, depression, skin  cancer, allergy, lumbar back pain, chronic venous insufficiency, prediabetes, vertigo, UTI, chest pain, abnormal chest x-ray, restless leg syndrome, lower extremity pain diffuse, generalized anxiety disorder, primary OA of both knees, lumbosacral radiculopathy due to intervertebral disc disorder, bradycardia, LBBB, cellulitis of RLE without foot, acute otitis media, she reports she had low back and neck surgery in her 20s due to degeneration of a vertebra, she reports this procedure was a fusion.  are also affecting patient's functional outcome.   REHAB POTENTIAL: Good  CLINICAL DECISION MAKING: Evolving/moderate complexity  EVALUATION COMPLEXITY: Moderate   PLAN:  PT FREQUENCY: 2x/week  PT DURATION: 12 weeks  PLANNED  INTERVENTIONS: Therapeutic exercises, Therapeutic activity, Neuromuscular re-education, Balance training, Gait training, Patient/Family education, Self Care, Joint mobilization, Stair training, Vestibular training, Canalith repositioning, Visual/preceptual remediation/compensation, Orthotic/Fit training, DME instructions, Wheelchair mobility training, Spinal mobilization, Cryotherapy, Moist heat, Taping, Ultrasound, Manual therapy, and Re-evaluation  PLAN FOR NEXT SESSION: cervical ROM, balance, ABC form   Zollie Pee, PT 01/06/2023, 1:17 PM

## 2023-01-09 ENCOUNTER — Ambulatory Visit: Payer: Medicare HMO | Attending: Otolaryngology

## 2023-01-09 DIAGNOSIS — R42 Dizziness and giddiness: Secondary | ICD-10-CM | POA: Insufficient documentation

## 2023-01-09 DIAGNOSIS — R262 Difficulty in walking, not elsewhere classified: Secondary | ICD-10-CM | POA: Diagnosis not present

## 2023-01-09 DIAGNOSIS — R2681 Unsteadiness on feet: Secondary | ICD-10-CM | POA: Insufficient documentation

## 2023-01-09 DIAGNOSIS — M6281 Muscle weakness (generalized): Secondary | ICD-10-CM

## 2023-01-09 DIAGNOSIS — M542 Cervicalgia: Secondary | ICD-10-CM | POA: Diagnosis not present

## 2023-01-09 NOTE — Therapy (Signed)
OUTPATIENT PHYSICAL THERAPY VESTIBULAR TREATMENT NOTE     Patient Name: Sandra Davidson MRN: [id number] DOB:1936/08/13, 87 y.o., female Today's Date: 01/09/2023  END OF SESSION:  PT End of Session - 01/09/23 1532     Visit Number 3    Number of Visits 25    Date for PT Re-Evaluation 03/25/23    PT Start Time 1346    PT Stop Time 1427    PT Time Calculation (min) 41 min    Equipment Utilized During Treatment Gait belt    Activity Tolerance Patient tolerated treatment well    Behavior During Therapy WFL for tasks assessed/performed               Past Medical History:  Diagnosis Date   Allergy    Anxiety    Arthritis    Cancer ([place])    skin   Depression    GERD (gastroesophageal reflux disease)    Hypertension    Migraines    Past Surgical History:  Procedure Laterality Date   BACK SURGERY     MOHS SURGERY     NECK SURGERY     skin cancer removed  12/14/2020   TONSILLECTOMY     WRIST SURGERY Right    Patient Active Problem List   Diagnosis Date Noted   Vaginal yeast infection 04/26/2020   Encounter for long-term (current) use of medications 10/27/2019   Acute otitis media 08/25/2019   Cellulitis of right lower extremity without foot 08/02/2019   Impaired fasting glucose 09/03/2018   Lumbosacral radiculopathy due to intervertebral disc disorder 09/03/2018   Primary osteoarthritis of both knees 07/27/2018   Encounter for general adult medical examination with abnormal findings 07/18/2018   Lower extremity pain, diffuse 07/18/2018   Generalized anxiety disorder 07/18/2018   Need for vaccination against Streptococcus pneumoniae using pneumococcal conjugate vaccine 13 07/18/2018   Bradycardia 06/18/2018   LBBB (left bundle branch block) 05/19/2018   Dysuria 04/22/2018   Restless leg syndrome, controlled 04/22/2018   Benign essential HTN 01/08/2018   Vertigo 01/07/2018   Chest pain 01/07/2018   UTI (urinary tract infection) 01/07/2018   Abnormal chest  x-ray 01/07/2018   Prediabetes 05/16/2017   Onychomycosis 05/16/2017   Essential hypertension 02/13/2017   Skin cancer 02/13/2017   Anxiety and depression 02/13/2017   Osteoarthritis 02/13/2017   Chronic venous insufficiency 09/26/2016   Lumbar back pain 09/26/2016   Neuropathy 09/26/2016    PCP: None listed REFERRING PROVIDER:   Carloyn Manner, MD    REFERRING DIAG:  R42 (ICD-10-CM) - Dizziness and giddiness  R26.89 (ICD-10-CM) - Imbalance    THERAPY DIAG:  Muscle weakness (generalized)  Unsteadiness on feet  Difficulty in walking, not elsewhere classified  Dizziness and giddiness  ONSET DATE: Sept 2023  Rationale for Evaluation and Treatment: Rehabilitation  SUBJECTIVE:   SUBJECTIVE STATEMENT: Pt reports one instance of brief lightheadedness since last appointment. Reports it affects her ears/they feel full when this happens. Pt reports B knee pain. She reports knees are OK at rest but can get to a 10 with some movements. Pt reports knee injections tomorrow.   Pt accompanied by: self  PERTINENT HISTORY:   Per referral the pt is an 87 yo female with recent hx of bilateral sharp, chronic ear pain (1 yr) of gradual onset, L worse than R, worsening, with feelings of fullness and tinnitus of both ears, and vertigo. Pt also with sensorineural hearing loss. PT went to ED 08/18/2022 for vertigo, was prescribed Meclizine. She  has not had spinning since, but continues to feel unsteady with lightheaded or swimmy-headed sensation. She is unsure what brings on her symptoms. PMH: arthritis, anxiety, migraines, HTN, depression, skin cancer, allergy, lumbar back pain, chronic venous insufficiency, prediabetes, vertigo, UTI, chest pain, abnormal chest x-ray, restless leg syndrome, lower extremity pain diffuse, generalized anxiety disorder, primary OA of both knees, lumbosacral radiculopathy due to intervertebral disc disorder, bradycardia, LBBB, cellulitis of RLE without foot, acute  otitis media, she reports she had low back and neck surgery in her 20s due to degeneration of a vertebra, she reports this procedure was a fusion.  Description of symptoms: dizziness is described as a lightheadedness mostly. She reports one instance of spinning (when she was laying down watching TV). She reports even when she closed her eyes she still felt the TV was moving. The spinning sensation lasted for 10-15 minutes. She has not had the spinning since. Pt reports she used to get migraines, but it has been years (30-40 years) and that at the time she had them fairly often with severe headaches. She has headaches every now and then but nothing like she used to. Pt's remaining symptoms are describes as a lightheadedness and "kind of" a swimmy-headedness and "getting sick on my stomach."  PAIN:  Are you having pain? Yes: NPRS scale: not rated/10 Pain location: Low back Pain description:   Aggravating factors: lifting something heavy Relieving factors: heat, ice  PRECAUTIONS: Fall, does have hx of fusions (cervical spine fusion, and a different procedure for her low back but she is unsure what)  WEIGHT BEARING RESTRICTIONS: No  FALLS: Has patient fallen in last 6 months? Yes. Number of falls 1  LIVING ENVIRONMENT: Lives with: lives alone Lives in: House/apartment Stairs: No Has following equipment at home: Single point cane, Environmental consultant - 2 wheeled, Environmental consultant - 4 wheeled, shower chair, and Ramped entry  PLOF: Independent  PATIENT GOALS: Get back to dancing, get rid of dizziness  OBJECTIVE:   DIAGNOSTIC FINDINGS:   US Carotid Bilateral 11/19/2022: "RIGHT CAROTID ARTERY: No significant calcifications of the right common carotid artery. Intermediate waveform maintained. Heterogeneous and partially calcified plaque at the right carotid bifurcation. No significant lumen shadowing. Low resistance waveform of the right ICA. No significant tortuosity.   RIGHT VERTEBRAL ARTERY: Antegrade flow  with low resistance waveform.   LEFT CAROTID ARTERY: No significant calcifications of the left common carotid artery. Intermediate waveform maintained. Heterogeneous and partially calcified plaque at the left carotid bifurcation without significant lumen shadowing. Low resistance waveform of the left ICA. No significant tortuosity.   LEFT VERTEBRAL ARTERY:  Antegrade flow with low resistance waveform.   IMPRESSION: Color duplex indicates minimal heterogeneous and calcified plaque, with no hemodynamically significant stenosis by duplex criteria in the extracranial cerebrovascular circulation."  MR BRAIN WO Contrast 08/18/2022: "IMPRESSION: Normal MRI appearance of the brain for age. No acute or focal abnormality to explain the patient's symptoms."  CT HEAD WO CONTRAST 08/18/2022: "IMPRESSION: 1. Stable mild atrophy and white matter disease. This likely reflects the sequela of chronic microvascular ischemia. 2. No acute intracranial abnormality or significant interval change."    COGNITION: Overall cognitive status: Within functional limits for tasks assessed   SENSATION: Pt reports she has neuropathy in LE  EDEMA:  None observed    POSTURE:  increased thoracic kyphosis  Cervical ROM:    Pt observed to have limited cervical ext, reports this is a result from cervical fusion decades ago  STRENGTH:   LOWER EXTREMITY MMT:  Taken 01/06/23 MMT: strength grossly 4/5 in BLE  BED MOBILITY:  Impaired when pt has symptoms/dizziness, otherwise indep  TRANSFERS: Assistive device utilized: None  Sit to stand: Complete Independence Stand to sit: Complete Independence   GAIT: Gait pattern: pt ambulated clinic distances without AD. She is unsteady with ambulating with head turns (horizontal>vertical), vertical head turns are somewhat limited due to hx of cervical fusions from decades ago.  FUNCTIONAL TESTS:  Dynamic Gait Index: 19/24  PATIENT SURVEYS:  ABC scale  deferred FOTO 54  VESTIBULAR ASSESSMENT:  GENERAL OBSERVATION: reports FOF, uncertain of what brings on her symptoms, impacts ADLs generally when dizziness occurs   SYMPTOM BEHAVIOR:  Subjective history: Pt seen in ED Sept 2023 due to dizziness, was given Meclizine. Imaging reassuring per report. Pt then sent to ENT, found to have hearing loss, pt with chronic B ear pain (worse on L), and sense of fullness in ears with occ ringing. Pt feels things are generally worse on her L side. She has had only 1 recent instance of spinning that lasted for minutes. Other symptoms have been a lightheadedness or swimmy-headedness. Pt can become nauseated and vomit.   Non-Vestibular symptoms: changes in hearing, headaches, tinnitus, nausea/vomiting, and loss of consciousness  Type of dizziness: Spinning/Vertigo, Lightheadedness/Faint, and "Swimmyheaded" and unsteadiness.  Frequency: pt unsure  Duration: spinning lasted for minutes, but pt can be nauseated and feel off for entire day  Aggravating factors:  pt unsure    OCULOMOTOR EXAM:  Ocular Alignment: normal  Ocular ROM: No Limitations  Spontaneous Nystagmus: absent  Gaze-Induced Nystagmus: absent  Smooth Pursuits:  majority of movement smooth, did note 3 saccadic movements with vertical gaze   Saccades: intact  Convergence/Divergence: Sees double at approx 6" from nose   VESTIBULAR - OCULAR REFLEX:   Slow VOR: Normal  Head-Impulse Test:   - Catch up saccade noted 1x with testing of R side  -WNL to L  Dynamic Visual Acuity: Impaired, change from line 8 to 5    POSITIONAL TESTING:   Roll testing: negative B Dix-Hallpike: negative B      OTHOSTATICS:  Taken 01/06/23 Orthostatics- Supine: 126/57 mmHg, HR 59 bpm Seated: 108/49 mmHg, HR 59 bpm Standing: 115/55 mmHg, HR 69 bpm She reports no dizziness/lightheadedness throughout orthostatic testing  FUNCTIONAL GAIT: Dynamic Gait Index: 19/24, greatest deficits with obstacle clearance, gait  with head turns, change in gait speed   VESTIBULAR TREATMENT:                                                                                                   DATE:   TherEx: 3# AW donned each LE: LAQ 2x10 each LE Seated March 2x10 each LE. Pt reports brief instance of knee pain that did not occur with remaining reps Seated LTL step overs with orange hurdle 2x10 each LE. Rates easy  Standing heel raises 2x20 bilat  Standing DF 2x20 bilat  Standing hip abduction 2x15 each LE   NMR: At support surface, gait belt donned, CGA provided unless otherwise specified  Standing, NBOS, VORx1 with plain background, horizontal, vertical head  turn 1x30 sec each. Reports no dizziness.  -Pt then repeated this sequence standing on airex; no dizziness throughout  Standing airex, NBOS, EC 60 sec - up to min assist   Sequence: Seated visual tracking, vertical, with multi-color ball bend/reach 10x Seated visual tracking, horizontal, with multi-color ball side to side 10x Repeated above two interventions in standing Comments: No dizziness throughout  Reading sticky notes while ambulating in long hallway to incorporate vertical and horizontal head turns 2x length of hallway. Brief moment of unsteadiness, no dizziness, able to self-correct for unsteadiness  360 rolls against wall 4x 6 ft. Brief instance of symptoms of dizziness/recreates primary symptom <2 sec  SLB 30 sec each LE - intermittent UE support     PATIENT EDUCATION: Education details: Pt educated throughout session about proper posture and technique with exercises. Improved exercise technique, movement at target joints, use of target muscles after min to mod verbal, visual, tactile cues.  Person educated: Patient Education method: Explanation, Demonstration, Tactile cues, and Verbal cues Education comprehension: verbalized understanding and returned demonstration  HOME EXERCISE PROGRAM: Pt to continue HEP as previously  given 1.29: Access Code: [email or web address] URL: [email or web address] Date: 01/06/2023 Prepared by: Ricard Dillon  Exercises - Sit to Stand with Counter Support  - 1 x daily - 5 x weekly - 3 sets - 10 reps Access Code: [email or web address] URL: [email or web address] Date: 12/31/2022 Prepared by: Ricard Dillon  Exercises - Seated Gaze Stabilization with Head Rotation  - 1 x daily - 7 x weekly - 3 sets - 2 reps - 30 seconds hold   GOALS: Goals reviewed with patient? Yes  SHORT TERM GOALS: Target date: 02/20/2023   Patient will be independent in home exercise program to improve strength/mobility for better functional independence with ADLs. Baseline: initiated VOR HEP Goal status: INITIAL   LONG TERM GOALS: Target date: 04/03/2023    Patient will increase FOTO score to equal to or greater than  64  to demonstrate  improvement in mobility and quality of life. Baseline: 54 Goal status: INITIAL   2.  Patient will increase ABC scale score >80% to demonstrate better functional mobility and better confidence with ADLs.  Baseline: to be assessed next 1-2 visits Goal status: INITIAL  4.  Patient will increase dynamic gait index score to >21/24 as to demonstrate reduced fall risk and improved dynamic gait balance for better safety with community/home ambulation.   Baseline: 19/24 Goal status: INITIAL     ASSESSMENT:  CLINICAL IMPRESSION: Pt with excellent motivation to participate in session. Very enthusiastic throughout to complete exercises. The pt rates majority of therex as easy-medium. She only felt brief pain (<1 sec) with LAQ that did not return with further reps. Pt reports she experiences this pain occasionally with activity. The only intervention that recreated pt's dizzy symptoms was 360 turns against wall, indicating improvement. The pt will benefit from further skilled PT to address these deficits in order to improve balance, dizziness and to decrease fall risk.     OBJECTIVE IMPAIRMENTS: Abnormal gait, decreased balance, decreased mobility, difficulty walking, decreased ROM, dizziness, hypomobility, impaired sensation, improper body mechanics, postural dysfunction, and pain.   ACTIVITY LIMITATIONS: lifting, bending, standing, stairs, bed mobility, and locomotion level All activities can become limited when pt dizzy  PARTICIPATION LIMITATIONS: meal prep, cleaning, laundry, shopping, community activity, and yard work  PERSONAL FACTORS: Age, Past/current experiences, Sex, and 3+ comorbidities: PMH: arthritis, anxiety, migraines, HTN, depression, skin cancer, allergy, lumbar back pain, chronic venous insufficiency, prediabetes, vertigo, UTI, chest  pain, abnormal chest x-ray, restless leg syndrome, lower extremity pain diffuse, generalized anxiety disorder, primary OA of both knees, lumbosacral radiculopathy due to intervertebral disc disorder, bradycardia, LBBB, cellulitis of RLE without foot, acute otitis media, she reports she had low back and neck surgery in her 20s due to degeneration of a vertebra, she reports this procedure was a fusion.  are also affecting patient's functional outcome.   REHAB POTENTIAL: Good  CLINICAL DECISION MAKING: Evolving/moderate complexity  EVALUATION COMPLEXITY: Moderate   PLAN:  PT FREQUENCY: 2x/week  PT DURATION: 12 weeks  PLANNED INTERVENTIONS: Therapeutic exercises, Therapeutic activity, Neuromuscular re-education, Balance training, Gait training, Patient/Family education, Self Care, Joint mobilization, Stair training, Vestibular training, Canalith repositioning, Visual/preceptual remediation/compensation, Orthotic/Fit training, DME instructions, Wheelchair mobility training, Spinal mobilization, Cryotherapy, Moist heat, Taping, Ultrasound, Manual therapy, and Re-evaluation  PLAN FOR NEXT SESSION: cervical ROM, balance, ABC form, strength   Zollie Pee, PT 01/09/2023, 3:40 PM

## 2023-01-10 DIAGNOSIS — M170 Bilateral primary osteoarthritis of knee: Secondary | ICD-10-CM | POA: Diagnosis not present

## 2023-01-13 ENCOUNTER — Ambulatory Visit: Payer: Medicare HMO

## 2023-01-13 DIAGNOSIS — R2681 Unsteadiness on feet: Secondary | ICD-10-CM

## 2023-01-13 DIAGNOSIS — R42 Dizziness and giddiness: Secondary | ICD-10-CM

## 2023-01-13 DIAGNOSIS — R262 Difficulty in walking, not elsewhere classified: Secondary | ICD-10-CM | POA: Diagnosis not present

## 2023-01-13 DIAGNOSIS — M542 Cervicalgia: Secondary | ICD-10-CM | POA: Diagnosis not present

## 2023-01-13 DIAGNOSIS — M6281 Muscle weakness (generalized): Secondary | ICD-10-CM | POA: Diagnosis not present

## 2023-01-13 NOTE — Therapy (Signed)
OUTPATIENT PHYSICAL THERAPY VESTIBULAR TREATMENT NOTE     Patient Name: Sandra Davidson MRN: [id number] DOB:[birth year], 87 y.o., female Today's Date: 01/13/2023  END OF SESSION:  PT End of Session - 01/13/23 1101     Visit Number 4    Number of Visits 25    Date for PT Re-Evaluation 03/25/23    PT Start Time 1101    PT Stop Time 1136    PT Time Calculation (min) 35 min    Equipment Utilized During Treatment Gait belt    Activity Tolerance Patient tolerated treatment well;Other (comment)   limited by nausea   Behavior During Therapy [facility] for tasks assessed/performed               Past Medical History:  Diagnosis Date   Allergy    Anxiety    Arthritis    Cancer ([place])    skin   Depression    GERD (gastroesophageal reflux disease)    Hypertension    Migraines    Past Surgical History:  Procedure Laterality Date   BACK SURGERY     MOHS SURGERY     NECK SURGERY     skin cancer removed  12/14/2020   TONSILLECTOMY     WRIST SURGERY Right    Patient Active Problem List   Diagnosis Date Noted   Vaginal yeast infection 04/26/2020   Encounter for long-term (current) use of medications 10/27/2019   Acute otitis media 08/25/2019   Cellulitis of right lower extremity without foot 08/02/2019   Impaired fasting glucose 09/03/2018   Lumbosacral radiculopathy due to intervertebral disc disorder 09/03/2018   Primary osteoarthritis of both knees 07/27/2018   Encounter for general adult medical examination with abnormal findings 07/18/2018   Lower extremity pain, diffuse 07/18/2018   Generalized anxiety disorder 07/18/2018   Need for vaccination against Streptococcus pneumoniae using pneumococcal conjugate vaccine 13 07/18/2018   Bradycardia 06/18/2018   LBBB (left bundle branch block) 05/19/2018   Dysuria 04/22/2018   Restless leg syndrome, controlled 04/22/2018   Benign essential HTN 01/08/2018   Vertigo 01/07/2018   Chest pain 01/07/2018   UTI (urinary tract  infection) 01/07/2018   Abnormal chest x-ray 01/07/2018   Prediabetes 05/16/2017   Onychomycosis 05/16/2017   Essential hypertension 02/13/2017   Skin cancer 02/13/2017   Anxiety and depression 02/13/2017   Osteoarthritis 02/13/2017   Chronic venous insufficiency 09/26/2016   Lumbar back pain 09/26/2016   Neuropathy 09/26/2016    PCP: None listed REFERRING PROVIDER:   Carloyn Manner, MD    REFERRING DIAG:  R42 (ICD-10-CM) - Dizziness and giddiness  R26.89 (ICD-10-CM) - Imbalance    THERAPY DIAG:  Dizziness and giddiness  Unsteadiness on feet  Difficulty in walking, not elsewhere classified  ONSET DATE: Sept 2023  Rationale for Evaluation and Treatment: Rehabilitation  SUBJECTIVE:   SUBJECTIVE STATEMENT: Pt reports new onset of dizziness about an hour ago (no dizziness prior). She reports she has not gotten "sick" yet. She states, "my ear is trying to breathe" when pointing to her L ear. "It doesn't stay open." She describes dizziness as unsteadiness, stumbling. She says "nothing is spinning, but I'm very careful to turn. I feel a little bit like I would get nauseas, but I'm not. I'm just borderline."   Pt accompanied by: self  PERTINENT HISTORY:   Per referral the pt is an 87 yo female with recent hx of bilateral sharp, chronic ear pain (1 yr) of gradual onset, L worse than R, worsening, with  feelings of fullness and tinnitus of both ears, and vertigo. Pt also with sensorineural hearing loss. PT went to ED 08/18/2022 for vertigo, was prescribed Meclizine. She has not had spinning since, but continues to feel unsteady with lightheaded or swimmy-headed sensation. She is unsure what brings on her symptoms. PMH: arthritis, anxiety, migraines, HTN, depression, skin cancer, allergy, lumbar back pain, chronic venous insufficiency, prediabetes, vertigo, UTI, chest pain, abnormal chest x-ray, restless leg syndrome, lower extremity pain diffuse, generalized anxiety disorder,  primary OA of both knees, lumbosacral radiculopathy due to intervertebral disc disorder, bradycardia, LBBB, cellulitis of RLE without foot, acute otitis media, she reports she had low back and neck surgery in her 20s due to degeneration of a vertebra, she reports this procedure was a fusion.  Description of symptoms: dizziness is described as a lightheadedness mostly. She reports one instance of spinning (when she was laying down watching TV). She reports even when she closed her eyes she still felt the TV was moving. The spinning sensation lasted for 10-15 minutes. She has not had the spinning since. Pt reports she used to get migraines, but it has been years (30-40 years) and that at the time she had them fairly often with severe headaches. She has headaches every now and then but nothing like she used to. Pt's remaining symptoms are describes as a lightheadedness and "kind of" a swimmy-headedness and "getting sick on my stomach."  PAIN:  Are you having pain? Yes: NPRS scale: not rated/10 Pain location: Low back Pain description:   Aggravating factors: lifting something heavy Relieving factors: heat, ice  PRECAUTIONS: Fall, does have hx of fusions (cervical spine fusion, and a different procedure for her low back but she is unsure what)  WEIGHT BEARING RESTRICTIONS: No  FALLS: Has patient fallen in last 6 months? Yes. Number of falls 1  LIVING ENVIRONMENT: Lives with: lives alone Lives in: House/apartment Stairs: No Has following equipment at home: Single point cane, Environmental consultant - 2 wheeled, Environmental consultant - 4 wheeled, shower chair, and Ramped entry  PLOF: Independent  PATIENT GOALS: Get back to dancing, get rid of dizziness  OBJECTIVE:   DIAGNOSTIC FINDINGS:   US Carotid Bilateral 11/19/2022: "RIGHT CAROTID ARTERY: No significant calcifications of the right common carotid artery. Intermediate waveform maintained. Heterogeneous and partially calcified plaque at the right carotid bifurcation.  No significant lumen shadowing. Low resistance waveform of the right ICA. No significant tortuosity.   RIGHT VERTEBRAL ARTERY: Antegrade flow with low resistance waveform.   LEFT CAROTID ARTERY: No significant calcifications of the left common carotid artery. Intermediate waveform maintained. Heterogeneous and partially calcified plaque at the left carotid bifurcation without significant lumen shadowing. Low resistance waveform of the left ICA. No significant tortuosity.   LEFT VERTEBRAL ARTERY:  Antegrade flow with low resistance waveform.   IMPRESSION: Color duplex indicates minimal heterogeneous and calcified plaque, with no hemodynamically significant stenosis by duplex criteria in the extracranial cerebrovascular circulation."  MR BRAIN WO Contrast 08/18/2022: "IMPRESSION: Normal MRI appearance of the brain for age. No acute or focal abnormality to explain the patient's symptoms."  CT HEAD WO CONTRAST 08/18/2022: "IMPRESSION: 1. Stable mild atrophy and white matter disease. This likely reflects the sequela of chronic microvascular ischemia. 2. No acute intracranial abnormality or significant interval change."    COGNITION: Overall cognitive status: Within functional limits for tasks assessed   SENSATION: Pt reports she has neuropathy in LE  EDEMA:  None observed    POSTURE:  increased thoracic kyphosis  Cervical ROM:  Pt observed to have limited cervical ext, reports this is a result from cervical fusion decades ago  STRENGTH:   LOWER EXTREMITY MMT:   Taken 01/06/23 MMT: strength grossly 4/5 in BLE  BED MOBILITY:  Impaired when pt has symptoms/dizziness, otherwise indep  TRANSFERS: Assistive device utilized: None  Sit to stand: Complete Independence Stand to sit: Complete Independence   GAIT: Gait pattern: pt ambulated clinic distances without AD. She is unsteady with ambulating with head turns (horizontal>vertical), vertical head turns are somewhat  limited due to hx of cervical fusions from decades ago.  FUNCTIONAL TESTS:  Dynamic Gait Index: 19/24  PATIENT SURVEYS:  ABC scale deferred FOTO 54  VESTIBULAR ASSESSMENT:  GENERAL OBSERVATION: reports FOF, uncertain of what brings on her symptoms, impacts ADLs generally when dizziness occurs   SYMPTOM BEHAVIOR:  Subjective history: Pt seen in ED Sept 2023 due to dizziness, was given Meclizine. Imaging reassuring per report. Pt then sent to ENT, found to have hearing loss, pt with chronic B ear pain (worse on L), and sense of fullness in ears with occ ringing. Pt feels things are generally worse on her L side. She has had only 1 recent instance of spinning that lasted for minutes. Other symptoms have been a lightheadedness or swimmy-headedness. Pt can become nauseated and vomit.   Non-Vestibular symptoms: changes in hearing, headaches, tinnitus, nausea/vomiting, and loss of consciousness  Type of dizziness: Spinning/Vertigo, Lightheadedness/Faint, and "Swimmyheaded" and unsteadiness.  Frequency: pt unsure  Duration: spinning lasted for minutes, but pt can be nauseated and feel off for entire day  Aggravating factors:  pt unsure    OCULOMOTOR EXAM:  Ocular Alignment: normal  Ocular ROM: No Limitations  Spontaneous Nystagmus: absent  Gaze-Induced Nystagmus: absent  Smooth Pursuits:  majority of movement smooth, did note 3 saccadic movements with vertical gaze   Saccades: intact  Convergence/Divergence: Sees double at approx 6" from nose   VESTIBULAR - OCULAR REFLEX:   Slow VOR: Normal  Head-Impulse Test:   - Catch up saccade noted 1x with testing of R side  -WNL to L  Dynamic Visual Acuity: Impaired, change from line 8 to 5    POSITIONAL TESTING:   Roll testing: negative B Dix-Hallpike: negative B      OTHOSTATICS:  Taken 01/06/23 Orthostatics- Supine: 126/57 mmHg, HR 59 bpm Seated: 108/49 mmHg, HR 59 bpm Standing: 115/55 mmHg, HR 69 bpm She reports no  dizziness/lightheadedness throughout orthostatic testing  FUNCTIONAL GAIT: Dynamic Gait Index: 19/24, greatest deficits with obstacle clearance, gait with head turns, change in gait speed   VESTIBULAR TREATMENT:                                                                                                   DATE:   TherAct Orthostatics reassessed: Supine: 147/60 mmHg, HR 58 bpm Seated: 136/63 mmHg, HR 56 bpm Upon sitting pt says "it's like a heartbeat. It thumps and it's gone."  Standing: 136/59 mmHg, HR 58. Pt reports repeated sensation. "Like a double beat" that goes away "in a flash"  TherEx At support surface- Standing VORx1, NBOS, horizontal  and vertical head turns 2x30-45 sec of each. Pt reports slight blurring of target image, pt reports brief recreation of symptoms with first set of vertical head turns. --repeated sequence on airex pad in WBOS  2x30 sec of each.  Standing on ariex EC 2x60 sec. Increased sway, but no LOB.  SLB 2x30 sec each LE. With finger support on bar  -Discontinued as pt reports feeling overheated and faint, somewhat nauseated (pt reports these are her typical symptoms). She did not have these symptoms with any other interventions.   Rest break for remainder of session. Pt monitors pt for symptom response  Transport takes pt back upstairs to her car. PT instructed pt not to drive if continuing to feel dizzy/sick. Pt has not brought her nausea medication with her. She reports her daughter can pick her up if she doesn't feel better.   PATIENT EDUCATION: Education details: Pt educated throughout session about proper posture and technique with exercises. Improved exercise technique, movement at target joints, use of target muscles after min to mod verbal, visual, tactile cues.  Person educated: Patient Education method: Explanation, Demonstration, Tactile cues, and Verbal cues Education comprehension: verbalized understanding and returned  demonstration  HOME EXERCISE PROGRAM: Pt to continue HEP as previously given 1.29: Access Code: [email or web address] URL: [email or web address] Date: 01/06/2023 Prepared by: Ricard Dillon  Exercises - Sit to Stand with Counter Support  - 1 x daily - 5 x weekly - 3 sets - 10 reps Access Code: [email or web address] URL: [email or web address] Date: 12/31/2022 Prepared by: Ricard Dillon  Exercises - Seated Gaze Stabilization with Head Rotation  - 1 x daily - 7 x weekly - 3 sets - 2 reps - 30 seconds hold   GOALS: Goals reviewed with patient? Yes  SHORT TERM GOALS: Target date: 02/24/2023   Patient will be independent in home exercise program to improve strength/mobility for better functional independence with ADLs. Baseline: initiated VOR HEP Goal status: INITIAL   LONG TERM GOALS: Target date: 04/07/2023    Patient will increase FOTO score to equal to or greater than  64  to demonstrate  improvement in mobility and quality of life. Baseline: 54 Goal status: INITIAL   2.  Patient will increase ABC scale score >80% to demonstrate better functional mobility and better confidence with ADLs.  Baseline: to be assessed next 1-2 visits Goal status: INITIAL  4.  Patient will increase dynamic gait index score to >21/24 as to demonstrate reduced fall risk and improved dynamic gait balance for better safety with community/home ambulation.   Baseline: 19/24 Goal status: INITIAL     ASSESSMENT:  CLINICAL IMPRESSION: Pt presents to appointment reporting her symptoms returned this morning. Pt tolerated majority of interventions well with minimal symptoms, but did report increased nausea suddenly with SLB exercise. Session at this point discontinued, pt provided emesis bag (although pt did not vomit) and PT had transport come down to get pt so pt would not have to ambulate out of clinic. Pt reports her daughter can come pick her up from appointment if pt continues to feel too nauseated  to drive. Pt The pt will benefit from further skilled PT to address these deficits in order to improve balance, dizziness and to decrease fall risk.    OBJECTIVE IMPAIRMENTS: Abnormal gait, decreased balance, decreased mobility, difficulty walking, decreased ROM, dizziness, hypomobility, impaired sensation, improper body mechanics, postural dysfunction, and pain.   ACTIVITY LIMITATIONS: lifting, bending, standing, stairs, bed mobility, and locomotion level All activities can become limited when pt dizzy  PARTICIPATION LIMITATIONS: meal prep, cleaning, laundry, shopping, community activity, and yard work  PERSONAL FACTORS: Age, Past/current experiences, Sex, and 3+ comorbidities: PMH: arthritis, anxiety, migraines, HTN, depression, skin cancer, allergy, lumbar back pain, chronic venous insufficiency, prediabetes, vertigo, UTI, chest pain, abnormal chest x-ray, restless leg syndrome, lower extremity pain diffuse, generalized anxiety disorder, primary OA of both knees, lumbosacral radiculopathy due to intervertebral disc disorder, bradycardia, LBBB, cellulitis of RLE without foot, acute otitis media, she reports she had low back and neck surgery in her 20s due to degeneration of a vertebra, she reports this procedure was a fusion.  are also affecting patient's functional outcome.   REHAB POTENTIAL: Good  CLINICAL DECISION MAKING: Evolving/moderate complexity  EVALUATION COMPLEXITY: Moderate   PLAN:  PT FREQUENCY: 2x/week  PT DURATION: 12 weeks  PLANNED INTERVENTIONS: Therapeutic exercises, Therapeutic activity, Neuromuscular re-education, Balance training, Gait training, Patient/Family education, Self Care, Joint mobilization, Stair training, Vestibular training, Canalith repositioning, Visual/preceptual remediation/compensation, Orthotic/Fit training, DME instructions, Wheelchair mobility training, Spinal mobilization, Cryotherapy, Moist heat, Taping, Ultrasound, Manual therapy, and  Re-evaluation  PLAN FOR NEXT SESSION: cervical ROM, balance, VOR, strength   Zollie Pee, PT 01/13/2023, 2:22 PM

## 2023-01-14 DIAGNOSIS — L718 Other rosacea: Secondary | ICD-10-CM | POA: Diagnosis not present

## 2023-01-14 DIAGNOSIS — Z85828 Personal history of other malignant neoplasm of skin: Secondary | ICD-10-CM | POA: Diagnosis not present

## 2023-01-14 DIAGNOSIS — Z08 Encounter for follow-up examination after completed treatment for malignant neoplasm: Secondary | ICD-10-CM | POA: Diagnosis not present

## 2023-01-16 ENCOUNTER — Ambulatory Visit: Payer: Medicare HMO

## 2023-01-20 ENCOUNTER — Ambulatory Visit: Payer: Medicare HMO

## 2023-01-20 DIAGNOSIS — R42 Dizziness and giddiness: Secondary | ICD-10-CM

## 2023-01-20 DIAGNOSIS — M6281 Muscle weakness (generalized): Secondary | ICD-10-CM | POA: Diagnosis not present

## 2023-01-20 DIAGNOSIS — R262 Difficulty in walking, not elsewhere classified: Secondary | ICD-10-CM | POA: Diagnosis not present

## 2023-01-20 DIAGNOSIS — M542 Cervicalgia: Secondary | ICD-10-CM

## 2023-01-20 DIAGNOSIS — R2681 Unsteadiness on feet: Secondary | ICD-10-CM

## 2023-01-20 NOTE — Therapy (Signed)
OUTPATIENT PHYSICAL THERAPY VESTIBULAR TREATMENT NOTE     Patient Name: Sandra Davidson MRN: [id number] DOB:1935/12/22, 87 y.o., female Today's Date: 01/20/2023  END OF SESSION:  PT End of Session - 01/20/23 1054     Visit Number 5    Number of Visits 25    Date for PT Re-Evaluation 03/25/23    PT Start Time 1100    PT Stop Time 1143    PT Time Calculation (min) 43 min    Equipment Utilized During Treatment Gait belt    Activity Tolerance Patient tolerated treatment well    Behavior During Therapy WFL for tasks assessed/performed               Past Medical History:  Diagnosis Date   Allergy    Anxiety    Arthritis    Cancer ([place])    skin   Depression    GERD (gastroesophageal reflux disease)    Hypertension    Migraines    Past Surgical History:  Procedure Laterality Date   BACK SURGERY     MOHS SURGERY     NECK SURGERY     skin cancer removed  12/14/2020   TONSILLECTOMY     WRIST SURGERY Right    Patient Active Problem List   Diagnosis Date Noted   Vaginal yeast infection 04/26/2020   Encounter for long-term (current) use of medications 10/27/2019   Acute otitis media 08/25/2019   Cellulitis of right lower extremity without foot 08/02/2019   Impaired fasting glucose 09/03/2018   Lumbosacral radiculopathy due to intervertebral disc disorder 09/03/2018   Primary osteoarthritis of both knees 07/27/2018   Encounter for general adult medical examination with abnormal findings 07/18/2018   Lower extremity pain, diffuse 07/18/2018   Generalized anxiety disorder 07/18/2018   Need for vaccination against Streptococcus pneumoniae using pneumococcal conjugate vaccine 13 07/18/2018   Bradycardia 06/18/2018   LBBB (left bundle branch block) 05/19/2018   Dysuria 04/22/2018   Restless leg syndrome, controlled 04/22/2018   Benign essential HTN 01/08/2018   Vertigo 01/07/2018   Chest pain 01/07/2018   UTI (urinary tract infection) 01/07/2018   Abnormal chest  x-ray 01/07/2018   Prediabetes 05/16/2017   Onychomycosis 05/16/2017   Essential hypertension 02/13/2017   Skin cancer 02/13/2017   Anxiety and depression 02/13/2017   Osteoarthritis 02/13/2017   Chronic venous insufficiency 09/26/2016   Lumbar back pain 09/26/2016   Neuropathy 09/26/2016    PCP: None listed REFERRING PROVIDER:   Carloyn Manner, MD    REFERRING DIAG:  R42 (ICD-10-CM) - Dizziness and giddiness  R26.89 (ICD-10-CM) - Imbalance    THERAPY DIAG:  Dizziness and giddiness  Unsteadiness on feet  Cervicalgia  ONSET DATE: Sept 2023  Rationale for Evaluation and Treatment: Rehabilitation  SUBJECTIVE:   SUBJECTIVE STATEMENT: Pt reports she was very sick and threw up a lot following onset of dizziness reported last session. She reports she was unsteady at the time. Pt reports hx of visual sensitivity where seeing movement made her feel sick. Pt reports she slept well, ate well and has not done anything today that has impacted her symptoms- she feels OK at the moment. Pt did take antivert this morning.   Pt accompanied by: self  PERTINENT HISTORY:   Per referral the pt is an 87 yo female with recent hx of bilateral sharp, chronic ear pain (1 yr) of gradual onset, L worse than R, worsening, with feelings of fullness and tinnitus of both ears, and vertigo. Pt also with  sensorineural hearing loss. PT went to ED 08/18/2022 for vertigo, was prescribed Meclizine. She has not had spinning since, but continues to feel unsteady with lightheaded or swimmy-headed sensation. She is unsure what brings on her symptoms. PMH: arthritis, anxiety, migraines, HTN, depression, skin cancer, allergy, lumbar back pain, chronic venous insufficiency, prediabetes, vertigo, UTI, chest pain, abnormal chest x-ray, restless leg syndrome, lower extremity pain diffuse, generalized anxiety disorder, primary OA of both knees, lumbosacral radiculopathy due to intervertebral disc disorder, bradycardia,  LBBB, cellulitis of RLE without foot, acute otitis media, she reports she had low back and neck surgery in her 20s due to degeneration of a vertebra, she reports this procedure was a fusion.  Description of symptoms: dizziness is described as a lightheadedness mostly. She reports one instance of spinning (when she was laying down watching TV). She reports even when she closed her eyes she still felt the TV was moving. The spinning sensation lasted for 10-15 minutes. She has not had the spinning since. Pt reports she used to get migraines, but it has been years (30-40 years) and that at the time she had them fairly often with severe headaches. She has headaches every now and then but nothing like she used to. Pt's remaining symptoms are describes as a lightheadedness and "kind of" a swimmy-headedness and "getting sick on my stomach."  PAIN:  Are you having pain? Yes: NPRS scale: not rated/10 Pain location: Low back Pain description:   Aggravating factors: lifting something heavy Relieving factors: heat, ice  PRECAUTIONS: Fall, does have hx of fusions (cervical spine fusion, and a different procedure for her low back but she is unsure what)  WEIGHT BEARING RESTRICTIONS: No  FALLS: Has patient fallen in last 6 months? Yes. Number of falls 1  LIVING ENVIRONMENT: Lives with: lives alone Lives in: House/apartment Stairs: No Has following equipment at home: Single point cane, Environmental consultant - 2 wheeled, Environmental consultant - 4 wheeled, shower chair, and Ramped entry  PLOF: Independent  PATIENT GOALS: Get back to dancing, get rid of dizziness  OBJECTIVE:   DIAGNOSTIC FINDINGS:   US Carotid Bilateral 11/19/2022: "RIGHT CAROTID ARTERY: No significant calcifications of the right common carotid artery. Intermediate waveform maintained. Heterogeneous and partially calcified plaque at the right carotid bifurcation. No significant lumen shadowing. Low resistance waveform of the right ICA. No significant tortuosity.    RIGHT VERTEBRAL ARTERY: Antegrade flow with low resistance waveform.   LEFT CAROTID ARTERY: No significant calcifications of the left common carotid artery. Intermediate waveform maintained. Heterogeneous and partially calcified plaque at the left carotid bifurcation without significant lumen shadowing. Low resistance waveform of the left ICA. No significant tortuosity.   LEFT VERTEBRAL ARTERY:  Antegrade flow with low resistance waveform.   IMPRESSION: Color duplex indicates minimal heterogeneous and calcified plaque, with no hemodynamically significant stenosis by duplex criteria in the extracranial cerebrovascular circulation."  MR BRAIN WO Contrast 08/18/2022: "IMPRESSION: Normal MRI appearance of the brain for age. No acute or focal abnormality to explain the patient's symptoms."  CT HEAD WO CONTRAST 08/18/2022: "IMPRESSION: 1. Stable mild atrophy and white matter disease. This likely reflects the sequela of chronic microvascular ischemia. 2. No acute intracranial abnormality or significant interval change."    COGNITION: Overall cognitive status: Within functional limits for tasks assessed   SENSATION: Pt reports she has neuropathy in LE  EDEMA:  None observed    POSTURE:  increased thoracic kyphosis  Cervical ROM:    Pt observed to have limited cervical ext, reports this is a  result from cervical fusion decades ago  STRENGTH:   LOWER EXTREMITY MMT:   Taken 01/06/23 MMT: strength grossly 4/5 in BLE  BED MOBILITY:  Impaired when pt has symptoms/dizziness, otherwise indep  TRANSFERS: Assistive device utilized: None  Sit to stand: Complete Independence Stand to sit: Complete Independence   GAIT: Gait pattern: pt ambulated clinic distances without AD. She is unsteady with ambulating with head turns (horizontal>vertical), vertical head turns are somewhat limited due to hx of cervical fusions from decades ago.  FUNCTIONAL TESTS:  Dynamic Gait Index:  19/24  PATIENT SURVEYS:  ABC scale deferred FOTO 54  VESTIBULAR ASSESSMENT:  GENERAL OBSERVATION: reports FOF, uncertain of what brings on her symptoms, impacts ADLs generally when dizziness occurs   SYMPTOM BEHAVIOR:  Subjective history: Pt seen in ED Sept 2023 due to dizziness, was given Meclizine. Imaging reassuring per report. Pt then sent to ENT, found to have hearing loss, pt with chronic B ear pain (worse on L), and sense of fullness in ears with occ ringing. Pt feels things are generally worse on her L side. She has had only 1 recent instance of spinning that lasted for minutes. Other symptoms have been a lightheadedness or swimmy-headedness. Pt can become nauseated and vomit.   Non-Vestibular symptoms: changes in hearing, headaches, tinnitus, nausea/vomiting, and loss of consciousness  Type of dizziness: Spinning/Vertigo, Lightheadedness/Faint, and "Swimmyheaded" and unsteadiness.  Frequency: pt unsure  Duration: spinning lasted for minutes, but pt can be nauseated and feel off for entire day  Aggravating factors:  pt unsure    OCULOMOTOR EXAM:  Ocular Alignment: normal  Ocular ROM: No Limitations  Spontaneous Nystagmus: absent  Gaze-Induced Nystagmus: absent  Smooth Pursuits:  majority of movement smooth, did note 3 saccadic movements with vertical gaze   Saccades: intact  Convergence/Divergence: Sees double at approx 6" from nose   VESTIBULAR - OCULAR REFLEX:   Slow VOR: Normal  Head-Impulse Test:   - Catch up saccade noted 1x with testing of R side  -WNL to L  Dynamic Visual Acuity: Impaired, change from line 8 to 5    POSITIONAL TESTING:   Roll testing: negative B Dix-Hallpike: negative B      OTHOSTATICS:  Taken 01/06/23 Orthostatics- Supine: 126/57 mmHg, HR 59 bpm Seated: 108/49 mmHg, HR 59 bpm Standing: 115/55 mmHg, HR 69 bpm She reports no dizziness/lightheadedness throughout orthostatic testing  FUNCTIONAL GAIT: Dynamic Gait Index: 19/24, greatest  deficits with obstacle clearance, gait with head turns, change in gait speed   VESTIBULAR TREATMENT:                                                                                                   DATE:   NMR:   Standing NBOS VORx1 with vertical and horizontal head turns 2x30 sec of each. No dizziness symptoms with intervention.  Discussion of symptom threshold, management strategies when pt having higher symptom days. Discussed activities to modify or avoid on these days, and strategies to reduce symptoms (wear sunglasses if experiencing light sensitivity). Discussed with pt that nature of her symptoms is not fully consistent with problem  that can be managed with vestibular PT alone and that PT recommends further follow-up with a neurologist and pt's ENT. Pt reports feeling discouraged, but verbalized understanding.   Over 10 meters: Gait with vertical head turns x 2 rounds Gait with horizontal head turns x 2 rounds   Manual: Pt supine on plinth, bolster support under bilat knees. PT provides STM and TrP release to bilateral UT, bilateral posterior shoulder musculature, bilateral cervical paraspinals and muscles of occipital triangle. Pt very TTP throughout bilat cervical paraspinals and muscles of occipital triangle, 2 areas in L posterior shoulder mm. Pt reports some improvement in pain with manual therapy. Additionally, PT cues pt through gentle, sustained stretching into lateral cervical flexion and cervical rotation bilaterally.  Pt report 2 instances of <1 sec "wave" of nausea during STM. This resolved immediatly.   PATIENT EDUCATION: Education details: Pt educated throughout session about proper posture and technique with exercises. Improved exercise technique, movement at target joints, use of target muscles after min to mod verbal, visual, tactile cues.  Person educated: Patient Education method: Explanation, Demonstration, Tactile cues, and Verbal cues Education comprehension:  verbalized understanding and returned demonstration  HOME EXERCISE PROGRAM: Pt to continue HEP as previously given 1.29: Access Code: [email or web address] URL: [email or web address] Date: 01/06/2023 Prepared by: Ricard Dillon  Exercises - Sit to Stand with Counter Support  - 1 x daily - 5 x weekly - 3 sets - 10 reps Access Code: [email or web address] URL: [email or web address] Date: 12/31/2022 Prepared by: Ricard Dillon  Exercises - Seated Gaze Stabilization with Head Rotation  - 1 x daily - 7 x weekly - 3 sets - 2 reps - 30 seconds hold   GOALS: Goals reviewed with patient? Yes  SHORT TERM GOALS: Target date: 03/03/2023   Patient will be independent in home exercise program to improve strength/mobility for better functional independence with ADLs. Baseline: initiated VOR HEP Goal status: INITIAL   LONG TERM GOALS: Target date: 04/14/2023    Patient will increase FOTO score to equal to or greater than  64  to demonstrate  improvement in mobility and quality of life. Baseline: 54 Goal status: INITIAL   2.  Patient will increase ABC scale score >80% to demonstrate better functional mobility and better confidence with ADLs.  Baseline: to be assessed next 1-2 visits Goal status: INITIAL  4.  Patient will increase dynamic gait index score to >21/24 as to demonstrate reduced fall risk and improved dynamic gait balance for better safety with community/home ambulation.   Baseline: 19/24 Goal status: INITIAL     ASSESSMENT:  CLINICAL IMPRESSION: Pt returns with improved symptoms today. PT discussed with pt reasoning PT recommends follow-up with both ENT and a neurologist due to pt current symptoms and hx of migraines. Pt reported feeling discouraged but verbalized understanding to plan. PT has left message with pt's NP at PCP office via secure chat with recommendation for neuro referral. Pt also found to be TTP throughout bilateral cervical paraspinals and muscles of occipital  triangle, along with general impairment/stiffness of cervical ROM. Initiated manual therapy to address this deficit as it is a possible contributor to pt's symptoms. The pt will benefit from further skilled PT to address these deficits in order to improve balance, dizziness and to decrease fall risk.    OBJECTIVE IMPAIRMENTS: Abnormal gait, decreased balance, decreased mobility, difficulty walking, decreased ROM, dizziness, hypomobility, impaired sensation, improper body mechanics, postural dysfunction, and pain.   ACTIVITY LIMITATIONS: lifting, bending, standing, stairs, bed mobility, and locomotion level All activities  can become limited when pt dizzy  PARTICIPATION LIMITATIONS: meal prep, cleaning, laundry, shopping, community activity, and yard work  PERSONAL FACTORS: Age, Past/current experiences, Sex, and 3+ comorbidities: PMH: arthritis, anxiety, migraines, HTN, depression, skin cancer, allergy, lumbar back pain, chronic venous insufficiency, prediabetes, vertigo, UTI, chest pain, abnormal chest x-ray, restless leg syndrome, lower extremity pain diffuse, generalized anxiety disorder, primary OA of both knees, lumbosacral radiculopathy due to intervertebral disc disorder, bradycardia, LBBB, cellulitis of RLE without foot, acute otitis media, she reports she had low back and neck surgery in her 20s due to degeneration of a vertebra, she reports this procedure was a fusion.  are also affecting patient's functional outcome.   REHAB POTENTIAL: Good  CLINICAL DECISION MAKING: Evolving/moderate complexity  EVALUATION COMPLEXITY: Moderate   PLAN:  PT FREQUENCY: 2x/week  PT DURATION: 12 weeks  PLANNED INTERVENTIONS: Therapeutic exercises, Therapeutic activity, Neuromuscular re-education, Balance training, Gait training, Patient/Family education, Self Care, Joint mobilization, Stair training, Vestibular training, Canalith repositioning, Visual/preceptual remediation/compensation, Orthotic/Fit  training, DME instructions, Wheelchair mobility training, Spinal mobilization, Cryotherapy, Moist heat, Taping, Ultrasound, Manual therapy, and Re-evaluation  PLAN FOR NEXT SESSION: cervical ROM, balance, VOR, strength   Zollie Pee, PT 01/20/2023, 2:26 PM

## 2023-01-21 ENCOUNTER — Telehealth: Payer: Self-pay

## 2023-01-21 DIAGNOSIS — R2689 Other abnormalities of gait and mobility: Secondary | ICD-10-CM | POA: Diagnosis not present

## 2023-01-21 DIAGNOSIS — H8190 Unspecified disorder of vestibular function, unspecified ear: Secondary | ICD-10-CM | POA: Diagnosis not present

## 2023-01-21 NOTE — Telephone Encounter (Signed)
PT contacted pt's PCP office via secure phone line regarding PT concern that pt's dizziness might require additional management by a neurologist, and that PT recommends pt see neurologist for ongoing vestibular symptoms.   Ricard Dillon PT, DPT

## 2023-01-23 ENCOUNTER — Ambulatory Visit: Payer: Medicare HMO

## 2023-01-23 DIAGNOSIS — R42 Dizziness and giddiness: Secondary | ICD-10-CM | POA: Diagnosis not present

## 2023-01-23 DIAGNOSIS — M542 Cervicalgia: Secondary | ICD-10-CM

## 2023-01-23 DIAGNOSIS — R2681 Unsteadiness on feet: Secondary | ICD-10-CM | POA: Diagnosis not present

## 2023-01-23 DIAGNOSIS — M6281 Muscle weakness (generalized): Secondary | ICD-10-CM | POA: Diagnosis not present

## 2023-01-23 DIAGNOSIS — R262 Difficulty in walking, not elsewhere classified: Secondary | ICD-10-CM | POA: Diagnosis not present

## 2023-01-23 NOTE — Therapy (Signed)
OUTPATIENT PHYSICAL THERAPY VESTIBULAR TREATMENT NOTE     Patient Name: Sandra Davidson MRN: [id number] DOB:05-04-1936, 87 y.o., female Today's Date: 01/23/2023  END OF SESSION:  PT End of Session - 01/23/23 1422     Visit Number 6    Number of Visits 25    Date for PT Re-Evaluation 03/25/23    PT Start Time [id number]    PT Stop Time 1513    PT Time Calculation (min) 41 min    Equipment Utilized During Treatment Gait belt    Activity Tolerance Patient tolerated treatment well;No increased pain    Behavior During Therapy WFL for tasks assessed/performed               Past Medical History:  Diagnosis Date   Allergy    Anxiety    Arthritis    Cancer ([place])    skin   Depression    GERD (gastroesophageal reflux disease)    Hypertension    Migraines    Past Surgical History:  Procedure Laterality Date   BACK SURGERY     MOHS SURGERY     NECK SURGERY     skin cancer removed  12/14/2020   TONSILLECTOMY     WRIST SURGERY Right    Patient Active Problem List   Diagnosis Date Noted   Vaginal yeast infection 04/26/2020   Encounter for long-term (current) use of medications 10/27/2019   Acute otitis media 08/25/2019   Cellulitis of right lower extremity without foot 08/02/2019   Impaired fasting glucose 09/03/2018   Lumbosacral radiculopathy due to intervertebral disc disorder 09/03/2018   Primary osteoarthritis of both knees 07/27/2018   Encounter for general adult medical examination with abnormal findings 07/18/2018   Lower extremity pain, diffuse 07/18/2018   Generalized anxiety disorder 07/18/2018   Need for vaccination against Streptococcus pneumoniae using pneumococcal conjugate vaccine 13 07/18/2018   Bradycardia 06/18/2018   LBBB (left bundle branch block) 05/19/2018   Dysuria 04/22/2018   Restless leg syndrome, controlled 04/22/2018   Benign essential HTN 01/08/2018   Vertigo 01/07/2018   Chest pain 01/07/2018   UTI (urinary tract infection) 01/07/2018    Abnormal chest x-ray 01/07/2018   Prediabetes 05/16/2017   Onychomycosis 05/16/2017   Essential hypertension 02/13/2017   Skin cancer 02/13/2017   Anxiety and depression 02/13/2017   Osteoarthritis 02/13/2017   Chronic venous insufficiency 09/26/2016   Lumbar back pain 09/26/2016   Neuropathy 09/26/2016    PCP: None listed REFERRING PROVIDER:   Carloyn Manner, MD    REFERRING DIAG:  R42 (ICD-10-CM) - Dizziness and giddiness  R26.89 (ICD-10-CM) - Imbalance    THERAPY DIAG:  Unsteadiness on feet  Dizziness and giddiness  Cervicalgia  Muscle weakness (generalized)  ONSET DATE: Sept 2023  Rationale for Evaluation and Treatment: Rehabilitation  SUBJECTIVE:   SUBJECTIVE STATEMENT: Pt reports everything has been going fine, no symptoms currently, with exception of her L ear feeling heavy/full. Pt reports she has started an at-home treatment for her ear. Pt accompanied by: self  PERTINENT HISTORY:   Per referral the pt is an 87 yo female with recent hx of bilateral sharp, chronic ear pain (1 yr) of gradual onset, L worse than R, worsening, with feelings of fullness and tinnitus of both ears, and vertigo. Pt also with sensorineural hearing loss. PT went to ED 08/18/2022 for vertigo, was prescribed Meclizine. She has not had spinning since, but continues to feel unsteady with lightheaded or swimmy-headed sensation. She is unsure what brings on her  symptoms. PMH: arthritis, anxiety, migraines, HTN, depression, skin cancer, allergy, lumbar back pain, chronic venous insufficiency, prediabetes, vertigo, UTI, chest pain, abnormal chest x-ray, restless leg syndrome, lower extremity pain diffuse, generalized anxiety disorder, primary OA of both knees, lumbosacral radiculopathy due to intervertebral disc disorder, bradycardia, LBBB, cellulitis of RLE without foot, acute otitis media, she reports she had low back and neck surgery in her 20s due to degeneration of a vertebra, she reports  this procedure was a fusion.  Description of symptoms: dizziness is described as a lightheadedness mostly. She reports one instance of spinning (when she was laying down watching TV). She reports even when she closed her eyes she still felt the TV was moving. The spinning sensation lasted for 10-15 minutes. She has not had the spinning since. Pt reports she used to get migraines, but it has been years (30-40 years) and that at the time she had them fairly often with severe headaches. She has headaches every now and then but nothing like she used to. Pt's remaining symptoms are describes as a lightheadedness and "kind of" a swimmy-headedness and "getting sick on my stomach."  PAIN:  Are you having pain? Yes: NPRS scale: not rated/10 Pain location: Low back Pain description:   Aggravating factors: lifting something heavy Relieving factors: heat, ice  PRECAUTIONS: Fall, does have hx of fusions (cervical spine fusion, and a different procedure for her low back but she is unsure what)  WEIGHT BEARING RESTRICTIONS: No  FALLS: Has patient fallen in last 6 months? Yes. Number of falls 1  LIVING ENVIRONMENT: Lives with: lives alone Lives in: House/apartment Stairs: No Has following equipment at home: Single point cane, Environmental consultant - 2 wheeled, Environmental consultant - 4 wheeled, shower chair, and Ramped entry  PLOF: Independent  PATIENT GOALS: Get back to dancing, get rid of dizziness  OBJECTIVE:   DIAGNOSTIC FINDINGS:   US Carotid Bilateral 11/19/2022: "RIGHT CAROTID ARTERY: No significant calcifications of the right common carotid artery. Intermediate waveform maintained. Heterogeneous and partially calcified plaque at the right carotid bifurcation. No significant lumen shadowing. Low resistance waveform of the right ICA. No significant tortuosity.   RIGHT VERTEBRAL ARTERY: Antegrade flow with low resistance waveform.   LEFT CAROTID ARTERY: No significant calcifications of the left common carotid artery.  Intermediate waveform maintained. Heterogeneous and partially calcified plaque at the left carotid bifurcation without significant lumen shadowing. Low resistance waveform of the left ICA. No significant tortuosity.   LEFT VERTEBRAL ARTERY:  Antegrade flow with low resistance waveform.   IMPRESSION: Color duplex indicates minimal heterogeneous and calcified plaque, with no hemodynamically significant stenosis by duplex criteria in the extracranial cerebrovascular circulation."  MR BRAIN WO Contrast 08/18/2022: "IMPRESSION: Normal MRI appearance of the brain for age. No acute or focal abnormality to explain the patient's symptoms."  CT HEAD WO CONTRAST 08/18/2022: "IMPRESSION: 1. Stable mild atrophy and white matter disease. This likely reflects the sequela of chronic microvascular ischemia. 2. No acute intracranial abnormality or significant interval change."    COGNITION: Overall cognitive status: Within functional limits for tasks assessed   SENSATION: Pt reports she has neuropathy in LE  EDEMA:  None observed    POSTURE:  increased thoracic kyphosis  Cervical ROM:    Pt observed to have limited cervical ext, reports this is a result from cervical fusion decades ago  STRENGTH:   LOWER EXTREMITY MMT:   Taken 01/06/23 MMT: strength grossly 4/5 in BLE  BED MOBILITY:  Impaired when pt has symptoms/dizziness, otherwise indep  TRANSFERS:  Assistive device utilized: None  Sit to stand: Complete Independence Stand to sit: Complete Independence   GAIT: Gait pattern: pt ambulated clinic distances without AD. She is unsteady with ambulating with head turns (horizontal>vertical), vertical head turns are somewhat limited due to hx of cervical fusions from decades ago.  FUNCTIONAL TESTS:  Dynamic Gait Index: 19/24  PATIENT SURVEYS:  ABC scale deferred FOTO 54  VESTIBULAR ASSESSMENT:  GENERAL OBSERVATION: reports FOF, uncertain of what brings on her symptoms,  impacts ADLs generally when dizziness occurs   SYMPTOM BEHAVIOR:  Subjective history: Pt seen in ED Sept 2023 due to dizziness, was given Meclizine. Imaging reassuring per report. Pt then sent to ENT, found to have hearing loss, pt with chronic B ear pain (worse on L), and sense of fullness in ears with occ ringing. Pt feels things are generally worse on her L side. She has had only 1 recent instance of spinning that lasted for minutes. Other symptoms have been a lightheadedness or swimmy-headedness. Pt can become nauseated and vomit.   Non-Vestibular symptoms: changes in hearing, headaches, tinnitus, nausea/vomiting, and loss of consciousness  Type of dizziness: Spinning/Vertigo, Lightheadedness/Faint, and "Swimmyheaded" and unsteadiness.  Frequency: pt unsure  Duration: spinning lasted for minutes, but pt can be nauseated and feel off for entire day  Aggravating factors:  pt unsure    OCULOMOTOR EXAM:  Ocular Alignment: normal  Ocular ROM: No Limitations  Spontaneous Nystagmus: absent  Gaze-Induced Nystagmus: absent  Smooth Pursuits:  majority of movement smooth, did note 3 saccadic movements with vertical gaze   Saccades: intact  Convergence/Divergence: Sees double at approx 6" from nose   VESTIBULAR - OCULAR REFLEX:   Slow VOR: Normal  Head-Impulse Test:   - Catch up saccade noted 1x with testing of R side  -WNL to L  Dynamic Visual Acuity: Impaired, change from line 8 to 5    POSITIONAL TESTING:   Roll testing: negative B Dix-Hallpike: negative B      OTHOSTATICS:  Taken 01/06/23 Orthostatics- Supine: 126/57 mmHg, HR 59 bpm Seated: 108/49 mmHg, HR 59 bpm Standing: 115/55 mmHg, HR 69 bpm She reports no dizziness/lightheadedness throughout orthostatic testing  FUNCTIONAL GAIT: Dynamic Gait Index: 19/24, greatest deficits with obstacle clearance, gait with head turns, change in gait speed   VESTIBULAR TREATMENT:                                                                                                    DATE:    Manual: with use of heat donned bilat shoulder (pt reports nothing currently donned/applied to shoulders to prohibit use of heat. Pt skin checked and is WNL, no adverse reaction to heat). Pt supine on plinth, bolster support under bilat knees. PT provides STM and TrP release to bilateral cervical paraspinals and muscles of occipital triangle. Pt continues to be TTP throughout bilat cervical paraspinals and muscles of occipital triangle. Additionally, PT cues pt through gentle, sustained stretching into lateral cervical flexion and cervical rotation bilaterally.  Instructed in chin tuck 10x 3 sec holds/rep while providing gentle TrP release to occipital triangle musculature.  No symptoms with intervention  TherEx  Seated scapular squeezes 10x bilat Seated shoulder shrugs 10x bilat Seated shoulder circles CW/CC 10x each bilateral Cervical rotation AROM 10x alt sides. No symptoms STS 2x10   NMR:   Over 10 meters: Gait with horizontal head turns x multiple rounds length of 10 meters. Slight variability in BOS but no significant LOB.  Airex: at support surface -WBOS EO 30 sec -NBOS EO 30 sec -NBOS EC x multiple round 30 sec up to min assist -NBOS EO vertical and horizontal head turns 10x for each. Pt slightly symptomatic, intervention discontinued to maintain therapeutic range without making pt too symptomatic. Pt performed STS following intervention (above), and reported she was back to baseline/no dizziness by end of session.   PATIENT EDUCATION: Education details: Pt educated throughout session about proper posture and technique with exercises. Improved exercise technique, movement at target joints, use of target muscles after min to mod verbal, visual, tactile cues.  Person educated: Patient Education method: Explanation, Demonstration, Tactile cues, and Verbal cues Education comprehension: verbalized understanding and returned  demonstration  HOME EXERCISE PROGRAM: Pt to continue HEP as previously given 1.29: Access Code: [email or web address] URL: [email or web address] Date: 01/06/2023 Prepared by: Ricard Dillon  Exercises - Sit to Stand with Counter Support  - 1 x daily - 5 x weekly - 3 sets - 10 reps Access Code: [email or web address] URL: [email or web address] Date: 12/31/2022 Prepared by: Ricard Dillon  Exercises - Seated Gaze Stabilization with Head Rotation  - 1 x daily - 7 x weekly - 3 sets - 2 reps - 30 seconds hold   GOALS: Goals reviewed with patient? Yes  SHORT TERM GOALS: Target date: 03/06/2023   Patient will be independent in home exercise program to improve strength/mobility for better functional independence with ADLs. Baseline: initiated VOR HEP Goal status: INITIAL   LONG TERM GOALS: Target date: 04/17/2023    Patient will increase FOTO score to equal to or greater than  64  to demonstrate  improvement in mobility and quality of life. Baseline: 54 Goal status: INITIAL   2.  Patient will increase ABC scale score >80% to demonstrate better functional mobility and better confidence with ADLs.  Baseline: to be assessed next 1-2 visits Goal status: INITIAL  4.  Patient will increase dynamic gait index score to >21/24 as to demonstrate reduced fall risk and improved dynamic gait balance for better safety with community/home ambulation.   Baseline: 19/24 Goal status: INITIAL     ASSESSMENT:  CLINICAL IMPRESSION: Start of session dedicated to manual therapy for pain modulation and to promote improved cervical mobility. The pt then performed vestibular interventions, with minimal variability in BOS with gait with head turns and no dizziness. Pt only mildly symptomatic, within therapeutic range for habituation, with airex interventions, where she required up to min a to correct for LOB. Pt symptoms resolved quickly with seated rest and following STS intevention. The pt will benefit  from further skilled PT to address these deficits in order to improve balance, dizziness and to decrease fall risk.    OBJECTIVE IMPAIRMENTS: Abnormal gait, decreased balance, decreased mobility, difficulty walking, decreased ROM, dizziness, hypomobility, impaired sensation, improper body mechanics, postural dysfunction, and pain.   ACTIVITY LIMITATIONS: lifting, bending, standing, stairs, bed mobility, and locomotion level All activities can become limited when pt dizzy  PARTICIPATION LIMITATIONS: meal prep, cleaning, laundry, shopping, community activity, and yard work  PERSONAL FACTORS: Age, Past/current experiences, Sex, and 3+ comorbidities: PMH: arthritis, anxiety, migraines, HTN, depression, skin cancer, allergy,  lumbar back pain, chronic venous insufficiency, prediabetes, vertigo, UTI, chest pain, abnormal chest x-ray, restless leg syndrome, lower extremity pain diffuse, generalized anxiety disorder, primary OA of both knees, lumbosacral radiculopathy due to intervertebral disc disorder, bradycardia, LBBB, cellulitis of RLE without foot, acute otitis media, she reports she had low back and neck surgery in her 20s due to degeneration of a vertebra, she reports this procedure was a fusion.  are also affecting patient's functional outcome.   REHAB POTENTIAL: Good  CLINICAL DECISION MAKING: Evolving/moderate complexity  EVALUATION COMPLEXITY: Moderate   PLAN:  PT FREQUENCY: 2x/week  PT DURATION: 12 weeks  PLANNED INTERVENTIONS: Therapeutic exercises, Therapeutic activity, Neuromuscular re-education, Balance training, Gait training, Patient/Family education, Self Care, Joint mobilization, Stair training, Vestibular training, Canalith repositioning, Visual/preceptual remediation/compensation, Orthotic/Fit training, DME instructions, Wheelchair mobility training, Spinal mobilization, Cryotherapy, Moist heat, Taping, Ultrasound, Manual therapy, and Re-evaluation  PLAN FOR NEXT SESSION:  cervical ROM, balance, VOR, strength   Zollie Pee, PT 01/23/2023, 5:49 PM

## 2023-01-27 ENCOUNTER — Ambulatory Visit: Payer: Medicare HMO

## 2023-01-27 DIAGNOSIS — M6281 Muscle weakness (generalized): Secondary | ICD-10-CM

## 2023-01-27 DIAGNOSIS — R42 Dizziness and giddiness: Secondary | ICD-10-CM | POA: Diagnosis not present

## 2023-01-27 DIAGNOSIS — R2681 Unsteadiness on feet: Secondary | ICD-10-CM

## 2023-01-27 DIAGNOSIS — R262 Difficulty in walking, not elsewhere classified: Secondary | ICD-10-CM | POA: Diagnosis not present

## 2023-01-27 DIAGNOSIS — M542 Cervicalgia: Secondary | ICD-10-CM

## 2023-01-27 NOTE — Therapy (Signed)
OUTPATIENT PHYSICAL THERAPY VESTIBULAR TREATMENT NOTE     Patient Name: Sandra Davidson MRN: [id number] DOB:02/01/1936, 87 y.o., female Today's Date: 01/27/2023  END OF SESSION:  PT End of Session - 01/27/23 1046     Visit Number 7    Number of Visits 25    Date for PT Re-Evaluation 03/25/23    PT Start Time 1050    PT Stop Time 1130    PT Time Calculation (min) 40 min    Equipment Utilized During Treatment Gait belt    Activity Tolerance Patient tolerated treatment well;No increased pain    Behavior During Therapy WFL for tasks assessed/performed               Past Medical History:  Diagnosis Date   Allergy    Anxiety    Arthritis    Cancer ([place])    skin   Depression    GERD (gastroesophageal reflux disease)    Hypertension    Migraines    Past Surgical History:  Procedure Laterality Date   BACK SURGERY     MOHS SURGERY     NECK SURGERY     skin cancer removed  12/14/2020   TONSILLECTOMY     WRIST SURGERY Right    Patient Active Problem List   Diagnosis Date Noted   Vaginal yeast infection 04/26/2020   Encounter for long-term (current) use of medications 10/27/2019   Acute otitis media 08/25/2019   Cellulitis of right lower extremity without foot 08/02/2019   Impaired fasting glucose 09/03/2018   Lumbosacral radiculopathy due to intervertebral disc disorder 09/03/2018   Primary osteoarthritis of both knees 07/27/2018   Encounter for general adult medical examination with abnormal findings 07/18/2018   Lower extremity pain, diffuse 07/18/2018   Generalized anxiety disorder 07/18/2018   Need for vaccination against Streptococcus pneumoniae using pneumococcal conjugate vaccine 13 07/18/2018   Bradycardia 06/18/2018   LBBB (left bundle branch block) 05/19/2018   Dysuria 04/22/2018   Restless leg syndrome, controlled 04/22/2018   Benign essential HTN 01/08/2018   Vertigo 01/07/2018   Chest pain 01/07/2018   UTI (urinary tract infection) 01/07/2018    Abnormal chest x-ray 01/07/2018   Prediabetes 05/16/2017   Onychomycosis 05/16/2017   Essential hypertension 02/13/2017   Skin cancer 02/13/2017   Anxiety and depression 02/13/2017   Osteoarthritis 02/13/2017   Chronic venous insufficiency 09/26/2016   Lumbar back pain 09/26/2016   Neuropathy 09/26/2016    PCP: None listed REFERRING PROVIDER:   Carloyn Manner, MD    REFERRING DIAG:  R42 (ICD-10-CM) - Dizziness and giddiness  R26.89 (ICD-10-CM) - Imbalance    THERAPY DIAG:  Cervicalgia  Unsteadiness on feet  Muscle weakness (generalized)  ONSET DATE: Sept 2023  Rationale for Evaluation and Treatment: Rehabilitation  SUBJECTIVE:   SUBJECTIVE STATEMENT: Pt reports continued L ear fullness, she reports no other symptoms. Has been practicing HEP Pt accompanied by: self  PERTINENT HISTORY:   Per referral the pt is an 87 yo female with recent hx of bilateral sharp, chronic ear pain (1 yr) of gradual onset, L worse than R, worsening, with feelings of fullness and tinnitus of both ears, and vertigo. Pt also with sensorineural hearing loss. PT went to ED 08/18/2022 for vertigo, was prescribed Meclizine. She has not had spinning since, but continues to feel unsteady with lightheaded or swimmy-headed sensation. She is unsure what brings on her symptoms. PMH: arthritis, anxiety, migraines, HTN, depression, skin cancer, allergy, lumbar back pain, chronic venous insufficiency, prediabetes, vertigo,  UTI, chest pain, abnormal chest x-ray, restless leg syndrome, lower extremity pain diffuse, generalized anxiety disorder, primary OA of both knees, lumbosacral radiculopathy due to intervertebral disc disorder, bradycardia, LBBB, cellulitis of RLE without foot, acute otitis media, she reports she had low back and neck surgery in her 20s due to degeneration of a vertebra, she reports this procedure was a fusion.  Description of symptoms: dizziness is described as a lightheadedness mostly. She  reports one instance of spinning (when she was laying down watching TV). She reports even when she closed her eyes she still felt the TV was moving. The spinning sensation lasted for 10-15 minutes. She has not had the spinning since. Pt reports she used to get migraines, but it has been years (30-40 years) and that at the time she had them fairly often with severe headaches. She has headaches every now and then but nothing like she used to. Pt's remaining symptoms are describes as a lightheadedness and "kind of" a swimmy-headedness and "getting sick on my stomach."  PAIN:  Are you having pain? Yes: NPRS scale: not rated/10 Pain location: Low back Pain description:   Aggravating factors: lifting something heavy Relieving factors: heat, ice  PRECAUTIONS: Fall, does have hx of fusions (cervical spine fusion, and a different procedure for her low back but she is unsure what)  WEIGHT BEARING RESTRICTIONS: No  FALLS: Has patient fallen in last 6 months? Yes. Number of falls 1  LIVING ENVIRONMENT: Lives with: lives alone Lives in: House/apartment Stairs: No Has following equipment at home: Single point cane, Environmental consultant - 2 wheeled, Environmental consultant - 4 wheeled, shower chair, and Ramped entry  PLOF: Independent  PATIENT GOALS: Get back to dancing, get rid of dizziness  OBJECTIVE:   DIAGNOSTIC FINDINGS:   US Carotid Bilateral 11/19/2022: "RIGHT CAROTID ARTERY: No significant calcifications of the right common carotid artery. Intermediate waveform maintained. Heterogeneous and partially calcified plaque at the right carotid bifurcation. No significant lumen shadowing. Low resistance waveform of the right ICA. No significant tortuosity.   RIGHT VERTEBRAL ARTERY: Antegrade flow with low resistance waveform.   LEFT CAROTID ARTERY: No significant calcifications of the left common carotid artery. Intermediate waveform maintained. Heterogeneous and partially calcified plaque at the left carotid bifurcation  without significant lumen shadowing. Low resistance waveform of the left ICA. No significant tortuosity.   LEFT VERTEBRAL ARTERY:  Antegrade flow with low resistance waveform.   IMPRESSION: Color duplex indicates minimal heterogeneous and calcified plaque, with no hemodynamically significant stenosis by duplex criteria in the extracranial cerebrovascular circulation."  MR BRAIN WO Contrast 08/18/2022: "IMPRESSION: Normal MRI appearance of the brain for age. No acute or focal abnormality to explain the patient's symptoms."  CT HEAD WO CONTRAST 08/18/2022: "IMPRESSION: 1. Stable mild atrophy and white matter disease. This likely reflects the sequela of chronic microvascular ischemia. 2. No acute intracranial abnormality or significant interval change."    COGNITION: Overall cognitive status: Within functional limits for tasks assessed   SENSATION: Pt reports she has neuropathy in LE  EDEMA:  None observed    POSTURE:  increased thoracic kyphosis  Cervical ROM:    Pt observed to have limited cervical ext, reports this is a result from cervical fusion decades ago  STRENGTH:   LOWER EXTREMITY MMT:   Taken 01/06/23 MMT: strength grossly 4/5 in BLE  BED MOBILITY:  Impaired when pt has symptoms/dizziness, otherwise indep  TRANSFERS: Assistive device utilized: None  Sit to stand: Complete Independence Stand to sit: Complete Independence   GAIT:  Gait pattern: pt ambulated clinic distances without AD. She is unsteady with ambulating with head turns (horizontal>vertical), vertical head turns are somewhat limited due to hx of cervical fusions from decades ago.  FUNCTIONAL TESTS:  Dynamic Gait Index: 19/24  PATIENT SURVEYS:  ABC scale deferred FOTO 54  VESTIBULAR ASSESSMENT:  GENERAL OBSERVATION: reports FOF, uncertain of what brings on her symptoms, impacts ADLs generally when dizziness occurs   SYMPTOM BEHAVIOR:  Subjective history: Pt seen in ED Sept 2023 due to  dizziness, was given Meclizine. Imaging reassuring per report. Pt then sent to ENT, found to have hearing loss, pt with chronic B ear pain (worse on L), and sense of fullness in ears with occ ringing. Pt feels things are generally worse on her L side. She has had only 1 recent instance of spinning that lasted for minutes. Other symptoms have been a lightheadedness or swimmy-headedness. Pt can become nauseated and vomit.   Non-Vestibular symptoms: changes in hearing, headaches, tinnitus, nausea/vomiting, and loss of consciousness  Type of dizziness: Spinning/Vertigo, Lightheadedness/Faint, and "Swimmyheaded" and unsteadiness.  Frequency: pt unsure  Duration: spinning lasted for minutes, but pt can be nauseated and feel off for entire day  Aggravating factors:  pt unsure    OCULOMOTOR EXAM:  Ocular Alignment: normal  Ocular ROM: No Limitations  Spontaneous Nystagmus: absent  Gaze-Induced Nystagmus: absent  Smooth Pursuits:  majority of movement smooth, did note 3 saccadic movements with vertical gaze   Saccades: intact  Convergence/Divergence: Sees double at approx 6" from nose   VESTIBULAR - OCULAR REFLEX:   Slow VOR: Normal  Head-Impulse Test:   - Catch up saccade noted 1x with testing of R side  -WNL to L  Dynamic Visual Acuity: Impaired, change from line 8 to 5    POSITIONAL TESTING:   Roll testing: negative B Dix-Hallpike: negative B      OTHOSTATICS:  Taken 01/06/23 Orthostatics- Supine: 126/57 mmHg, HR 59 bpm Seated: 108/49 mmHg, HR 59 bpm Standing: 115/55 mmHg, HR 69 bpm She reports no dizziness/lightheadedness throughout orthostatic testing  FUNCTIONAL GAIT: Dynamic Gait Index: 19/24, greatest deficits with obstacle clearance, gait with head turns, change in gait speed   VESTIBULAR TREATMENT:                                                                                                   DATE:    Manual: with use of heat donned bilat shoulder (pt reports nothing  currently donned/applied to shoulders to prohibit use of heat. Pt skin checked and is WNL, no adverse reaction to heat). Pt reports heat feels good. Pt supine on plinth, bolster support under bilat knees. PT provides STM and TrP release to bilateral cervical paraspinals and muscles of occipital triangle. Pt continues to be TTP throughout bilat cervical paraspinals and muscles of occipital triangle. Additionally, PT cues pt through gentle, sustained stretching into lateral cervical flexion, cervical rotation bilaterally and cervical flexion.  TherEx  YTB seated shoulder ER 3x12 bilat - issued as part of HEP YTB rows 2x15 bilat   NMR:   On airex: VORx1 vertical and  horizontal head turns 2x30 sec for each Static stand EC 3x30 sec. Exhibits decreased sway with reps   Over 10 meters: Gait with horizontal head turns and cervical spine in slight flexion (pt reports this is more difficult for her than looking straight ahead) 2x Tandem gait 2x, PT provides up to min assist  In long hallway (approx 80 ft): Gait with VORx1 horizontal head turns 2x length of hallway. PT provides up to min assist Gait with vertical head turns 2x length of hallway  No dizziness with interventions  PATIENT EDUCATION: Education details: Pt educated throughout session about proper posture and technique with exercises. Improved exercise technique, movement at target joints, use of target muscles after min to mod verbal, visual, tactile cues.  Person educated: Patient Education method: Explanation, Demonstration, Tactile cues, and Verbal cues Education comprehension: verbalized understanding and returned demonstration  HOME EXERCISE PROGRAM: Pt to continue HEP as previously given  01/27/23: Access Code: [email or web address] URL: [email or web address] Date: 01/27/2023 Prepared by: Ricard Dillon  Exercises - Shoulder External Rotation and Scapular Retraction with Resistance  - 1 x daily - 5 x weekly - 3 sets - 12  reps  1.29: Access Code: [email or web address] URL: [email or web address] Date: 01/06/2023 Prepared by: Ricard Dillon  Exercises - Sit to Stand with Counter Support  - 1 x daily - 5 x weekly - 3 sets - 10 reps Access Code: [email or web address] URL: [email or web address] Date: 12/31/2022 Prepared by: Ricard Dillon  Exercises - Seated Gaze Stabilization with Head Rotation  - 1 x daily - 7 x weekly - 3 sets - 2 reps - 30 seconds hold   GOALS: Goals reviewed with patient? Yes  SHORT TERM GOALS: Target date: 03/10/2023   Patient will be independent in home exercise program to improve strength/mobility for better functional independence with ADLs. Baseline: initiated VOR HEP Goal status: INITIAL   LONG TERM GOALS: Target date: 04/21/2023    Patient will increase FOTO score to equal to or greater than  64  to demonstrate  improvement in mobility and quality of life. Baseline: 54 Goal status: INITIAL   2.  Patient will increase ABC scale score >80% to demonstrate better functional mobility and better confidence with ADLs.  Baseline: to be assessed next 1-2 visits Goal status: INITIAL  4.  Patient will increase dynamic gait index score to >21/24 as to demonstrate reduced fall risk and improved dynamic gait balance for better safety with community/home ambulation.   Baseline: 19/24 Goal status: INITIAL     ASSESSMENT:  CLINICAL IMPRESSION: Pt with improved ease of performing head turn exercise following manual therapy. She reports greater difficulty with balance when cervical spine in slight flexion, downward gaze, and performing horizontal head turns. Pt did require up to min-assist with several dynamic balance interventions. The pt will benefit from further skilled PT to address these deficits in order to improve balance, dizziness and to decrease fall risk.    OBJECTIVE IMPAIRMENTS: Abnormal gait, decreased balance, decreased mobility, difficulty walking, decreased ROM,  dizziness, hypomobility, impaired sensation, improper body mechanics, postural dysfunction, and pain.   ACTIVITY LIMITATIONS: lifting, bending, standing, stairs, bed mobility, and locomotion level All activities can become limited when pt dizzy  PARTICIPATION LIMITATIONS: meal prep, cleaning, laundry, shopping, community activity, and yard work  PERSONAL FACTORS: Age, Past/current experiences, Sex, and 3+ comorbidities: PMH: arthritis, anxiety, migraines, HTN, depression, skin cancer, allergy, lumbar back pain, chronic venous insufficiency, prediabetes, vertigo, UTI, chest pain, abnormal chest x-ray, restless leg syndrome, lower extremity pain diffuse, generalized anxiety disorder,  primary OA of both knees, lumbosacral radiculopathy due to intervertebral disc disorder, bradycardia, LBBB, cellulitis of RLE without foot, acute otitis media, she reports she had low back and neck surgery in her 20s due to degeneration of a vertebra, she reports this procedure was a fusion.  are also affecting patient's functional outcome.   REHAB POTENTIAL: Good  CLINICAL DECISION MAKING: Evolving/moderate complexity  EVALUATION COMPLEXITY: Moderate   PLAN:  PT FREQUENCY: 2x/week  PT DURATION: 12 weeks  PLANNED INTERVENTIONS: Therapeutic exercises, Therapeutic activity, Neuromuscular re-education, Balance training, Gait training, Patient/Family education, Self Care, Joint mobilization, Stair training, Vestibular training, Canalith repositioning, Visual/preceptual remediation/compensation, Orthotic/Fit training, DME instructions, Wheelchair mobility training, Spinal mobilization, Cryotherapy, Moist heat, Taping, Ultrasound, Manual therapy, and Re-evaluation  PLAN FOR NEXT SESSION: cervical ROM, balance, VOR, strength   Zollie Pee, PT 01/27/2023, 11:40 AM

## 2023-01-28 DIAGNOSIS — G629 Polyneuropathy, unspecified: Secondary | ICD-10-CM | POA: Diagnosis not present

## 2023-01-28 DIAGNOSIS — R110 Nausea: Secondary | ICD-10-CM | POA: Diagnosis not present

## 2023-01-28 DIAGNOSIS — R42 Dizziness and giddiness: Secondary | ICD-10-CM | POA: Diagnosis not present

## 2023-01-28 DIAGNOSIS — R200 Anesthesia of skin: Secondary | ICD-10-CM | POA: Diagnosis not present

## 2023-01-28 DIAGNOSIS — R208 Other disturbances of skin sensation: Secondary | ICD-10-CM | POA: Diagnosis not present

## 2023-01-28 DIAGNOSIS — G2581 Restless legs syndrome: Secondary | ICD-10-CM | POA: Diagnosis not present

## 2023-01-28 DIAGNOSIS — R202 Paresthesia of skin: Secondary | ICD-10-CM | POA: Diagnosis not present

## 2023-01-29 DIAGNOSIS — Z85828 Personal history of other malignant neoplasm of skin: Secondary | ICD-10-CM | POA: Diagnosis not present

## 2023-01-29 DIAGNOSIS — Z08 Encounter for follow-up examination after completed treatment for malignant neoplasm: Secondary | ICD-10-CM | POA: Diagnosis not present

## 2023-01-29 DIAGNOSIS — X32XXXA Exposure to sunlight, initial encounter: Secondary | ICD-10-CM | POA: Diagnosis not present

## 2023-01-29 DIAGNOSIS — L570 Actinic keratosis: Secondary | ICD-10-CM | POA: Diagnosis not present

## 2023-01-29 DIAGNOSIS — L718 Other rosacea: Secondary | ICD-10-CM | POA: Diagnosis not present

## 2023-01-30 ENCOUNTER — Ambulatory Visit: Payer: Medicare HMO

## 2023-02-03 ENCOUNTER — Ambulatory Visit: Payer: Medicare HMO

## 2023-02-03 DIAGNOSIS — R2681 Unsteadiness on feet: Secondary | ICD-10-CM | POA: Diagnosis not present

## 2023-02-03 DIAGNOSIS — R262 Difficulty in walking, not elsewhere classified: Secondary | ICD-10-CM | POA: Diagnosis not present

## 2023-02-03 DIAGNOSIS — R42 Dizziness and giddiness: Secondary | ICD-10-CM | POA: Diagnosis not present

## 2023-02-03 DIAGNOSIS — M542 Cervicalgia: Secondary | ICD-10-CM

## 2023-02-03 DIAGNOSIS — M6281 Muscle weakness (generalized): Secondary | ICD-10-CM | POA: Diagnosis not present

## 2023-02-03 NOTE — Therapy (Signed)
OUTPATIENT PHYSICAL THERAPY VESTIBULAR TREATMENT NOTE     Patient Name: Sandra Davidson MRN: [id number] DOB:September 07, 1936, 87 y.o., female Today's Date: 02/03/2023  END OF SESSION:  PT End of Session - 02/03/23 1302     Visit Number 8    Number of Visits 25    Date for PT Re-Evaluation 03/25/23    PT Start Time 1101    PT Stop Time 1144    PT Time Calculation (min) 43 min    Equipment Utilized During Treatment Gait belt    Activity Tolerance Patient tolerated treatment well;No increased pain    Behavior During Therapy WFL for tasks assessed/performed               Past Medical History:  Diagnosis Date   Allergy    Anxiety    Arthritis    Cancer ([place])    skin   Depression    GERD (gastroesophageal reflux disease)    Hypertension    Migraines    Past Surgical History:  Procedure Laterality Date   BACK SURGERY     MOHS SURGERY     NECK SURGERY     skin cancer removed  12/14/2020   TONSILLECTOMY     WRIST SURGERY Right    Patient Active Problem List   Diagnosis Date Noted   Vaginal yeast infection 04/26/2020   Encounter for long-term (current) use of medications 10/27/2019   Acute otitis media 08/25/2019   Cellulitis of right lower extremity without foot 08/02/2019   Impaired fasting glucose 09/03/2018   Lumbosacral radiculopathy due to intervertebral disc disorder 09/03/2018   Primary osteoarthritis of both knees 07/27/2018   Encounter for general adult medical examination with abnormal findings 07/18/2018   Lower extremity pain, diffuse 07/18/2018   Generalized anxiety disorder 07/18/2018   Need for vaccination against Streptococcus pneumoniae using pneumococcal conjugate vaccine 13 07/18/2018   Bradycardia 06/18/2018   LBBB (left bundle branch block) 05/19/2018   Dysuria 04/22/2018   Restless leg syndrome, controlled 04/22/2018   Benign essential HTN 01/08/2018   Vertigo 01/07/2018   Chest pain 01/07/2018   UTI (urinary tract infection) 01/07/2018    Abnormal chest x-ray 01/07/2018   Prediabetes 05/16/2017   Onychomycosis 05/16/2017   Essential hypertension 02/13/2017   Skin cancer 02/13/2017   Anxiety and depression 02/13/2017   Osteoarthritis 02/13/2017   Chronic venous insufficiency 09/26/2016   Lumbar back pain 09/26/2016   Neuropathy 09/26/2016    PCP: None listed REFERRING PROVIDER:   Carloyn Manner, MD    REFERRING DIAG:  R42 (ICD-10-CM) - Dizziness and giddiness  R26.89 (ICD-10-CM) - Imbalance    THERAPY DIAG:  Unsteadiness on feet  Muscle weakness (generalized)  Cervicalgia  ONSET DATE: Sept 2023  Rationale for Evaluation and Treatment: Rehabilitation  SUBJECTIVE:   SUBJECTIVE STATEMENT: Pt reports she was prescribed medications at her neuro appt but reports she isn't sure how/when to take them. PT encourages pt to call her physician for instructions. Pt reports some stumbling since she was last here but no falls. She reports her usual neck/shoulder pain.  Pt accompanied by: self  PERTINENT HISTORY:   Per referral the pt is an 87 yo female with recent hx of bilateral sharp, chronic ear pain (1 yr) of gradual onset, L worse than R, worsening, with feelings of fullness and tinnitus of both ears, and vertigo. Pt also with sensorineural hearing loss. PT went to ED 08/18/2022 for vertigo, was prescribed Meclizine. She has not had spinning since, but continues to  feel unsteady with lightheaded or swimmy-headed sensation. She is unsure what brings on her symptoms. PMH: arthritis, anxiety, migraines, HTN, depression, skin cancer, allergy, lumbar back pain, chronic venous insufficiency, prediabetes, vertigo, UTI, chest pain, abnormal chest x-ray, restless leg syndrome, lower extremity pain diffuse, generalized anxiety disorder, primary OA of both knees, lumbosacral radiculopathy due to intervertebral disc disorder, bradycardia, LBBB, cellulitis of RLE without foot, acute otitis media, she reports she had low back and  neck surgery in her 20s due to degeneration of a vertebra, she reports this procedure was a fusion.  Description of symptoms: dizziness is described as a lightheadedness mostly. She reports one instance of spinning (when she was laying down watching TV). She reports even when she closed her eyes she still felt the TV was moving. The spinning sensation lasted for 10-15 minutes. She has not had the spinning since. Pt reports she used to get migraines, but it has been years (30-40 years) and that at the time she had them fairly often with severe headaches. She has headaches every now and then but nothing like she used to. Pt's remaining symptoms are describes as a lightheadedness and "kind of" a swimmy-headedness and "getting sick on my stomach."  PAIN:  Are you having pain? Yes: NPRS scale: not rated/10 Pain location: Low back Pain description:   Aggravating factors: lifting something heavy Relieving factors: heat, ice  PRECAUTIONS: Fall, does have hx of fusions (cervical spine fusion, and a different procedure for her low back but she is unsure what)  WEIGHT BEARING RESTRICTIONS: No  FALLS: Has patient fallen in last 6 months? Yes. Number of falls 1  LIVING ENVIRONMENT: Lives with: lives alone Lives in: House/apartment Stairs: No Has following equipment at home: Single point cane, Environmental consultant - 2 wheeled, Environmental consultant - 4 wheeled, shower chair, and Ramped entry  PLOF: Independent  PATIENT GOALS: Get back to dancing, get rid of dizziness  OBJECTIVE:   DIAGNOSTIC FINDINGS:   US Carotid Bilateral 11/19/2022: "RIGHT CAROTID ARTERY: No significant calcifications of the right common carotid artery. Intermediate waveform maintained. Heterogeneous and partially calcified plaque at the right carotid bifurcation. No significant lumen shadowing. Low resistance waveform of the right ICA. No significant tortuosity.   RIGHT VERTEBRAL ARTERY: Antegrade flow with low resistance waveform.   LEFT CAROTID  ARTERY: No significant calcifications of the left common carotid artery. Intermediate waveform maintained. Heterogeneous and partially calcified plaque at the left carotid bifurcation without significant lumen shadowing. Low resistance waveform of the left ICA. No significant tortuosity.   LEFT VERTEBRAL ARTERY:  Antegrade flow with low resistance waveform.   IMPRESSION: Color duplex indicates minimal heterogeneous and calcified plaque, with no hemodynamically significant stenosis by duplex criteria in the extracranial cerebrovascular circulation."  MR BRAIN WO Contrast 08/18/2022: "IMPRESSION: Normal MRI appearance of the brain for age. No acute or focal abnormality to explain the patient's symptoms."  CT HEAD WO CONTRAST 08/18/2022: "IMPRESSION: 1. Stable mild atrophy and white matter disease. This likely reflects the sequela of chronic microvascular ischemia. 2. No acute intracranial abnormality or significant interval change."    COGNITION: Overall cognitive status: Within functional limits for tasks assessed   SENSATION: Pt reports she has neuropathy in LE  EDEMA:  None observed    POSTURE:  increased thoracic kyphosis  Cervical ROM:    Pt observed to have limited cervical ext, reports this is a result from cervical fusion decades ago  STRENGTH:   LOWER EXTREMITY MMT:   Taken 01/06/23 MMT: strength grossly 4/5 in  BLE  BED MOBILITY:  Impaired when pt has symptoms/dizziness, otherwise indep  TRANSFERS: Assistive device utilized: None  Sit to stand: Complete Independence Stand to sit: Complete Independence   GAIT: Gait pattern: pt ambulated clinic distances without AD. She is unsteady with ambulating with head turns (horizontal>vertical), vertical head turns are somewhat limited due to hx of cervical fusions from decades ago.  FUNCTIONAL TESTS:  Dynamic Gait Index: 19/24  PATIENT SURVEYS:  ABC scale deferred FOTO 54  VESTIBULAR ASSESSMENT:  GENERAL  OBSERVATION: reports FOF, uncertain of what brings on her symptoms, impacts ADLs generally when dizziness occurs   SYMPTOM BEHAVIOR:  Subjective history: Pt seen in ED Sept 2023 due to dizziness, was given Meclizine. Imaging reassuring per report. Pt then sent to ENT, found to have hearing loss, pt with chronic B ear pain (worse on L), and sense of fullness in ears with occ ringing. Pt feels things are generally worse on her L side. She has had only 1 recent instance of spinning that lasted for minutes. Other symptoms have been a lightheadedness or swimmy-headedness. Pt can become nauseated and vomit.   Non-Vestibular symptoms: changes in hearing, headaches, tinnitus, nausea/vomiting, and loss of consciousness  Type of dizziness: Spinning/Vertigo, Lightheadedness/Faint, and "Swimmyheaded" and unsteadiness.  Frequency: pt unsure  Duration: spinning lasted for minutes, but pt can be nauseated and feel off for entire day  Aggravating factors:  pt unsure    OCULOMOTOR EXAM:  Ocular Alignment: normal  Ocular ROM: No Limitations  Spontaneous Nystagmus: absent  Gaze-Induced Nystagmus: absent  Smooth Pursuits:  majority of movement smooth, did note 3 saccadic movements with vertical gaze   Saccades: intact  Convergence/Divergence: Sees double at approx 6" from nose   VESTIBULAR - OCULAR REFLEX:   Slow VOR: Normal  Head-Impulse Test:   - Catch up saccade noted 1x with testing of R side  -WNL to L  Dynamic Visual Acuity: Impaired, change from line 8 to 5    POSITIONAL TESTING:   Roll testing: negative B Dix-Hallpike: negative B      OTHOSTATICS:  Taken 01/06/23 Orthostatics- Supine: 126/57 mmHg, HR 59 bpm Seated: 108/49 mmHg, HR 59 bpm Standing: 115/55 mmHg, HR 69 bpm She reports no dizziness/lightheadedness throughout orthostatic testing  FUNCTIONAL GAIT: Dynamic Gait Index: 19/24, greatest deficits with obstacle clearance, gait with head turns, change in gait speed   VESTIBULAR  TREATMENT:                                                                                                   DATE:    Manual:   Pt supine on plinth, bolster support under bilat knees. PT provides STM and TrP release to bilateral cervical paraspinals and muscles of occipital triangle. Pt continues to be TTP throughout bilat cervical paraspinals and muscles of occipital triangle. Additionally, PT cues pt through gentle, sustained stretching into lateral cervical flexion, cervical rotation bilaterally and cervical flexion. Pt continues to report intervention feels good, exhibits more fluid cervical AROM with VOR below following intervention  TherEx Supine chin tuck 10x 2-3 sec holds  YTB seated shoulder  ER 15x BUE YTB rows 20x BUE  STS 2x20 hands-free LAQ with 3# AW 20x each LE  Standing hip abd with 3# AW  3x10 each LE   NMR:   VORx1 in tandem stance vertical and horizontal head turns 1x30 sec for each in each LE position SLB 2x30 sec each LE. Intermittent UE support  Over 10 meters: Tandem gait 4x CGA-min a  PATIENT EDUCATION: Education details: Pt educated throughout session about proper posture and technique with exercises. Improved exercise technique, movement at target joints, use of target muscles after min to mod verbal, visual, tactile cues.  Person educated: Patient Education method: Explanation, Demonstration, Tactile cues, and Verbal cues Education comprehension: verbalized understanding and returned demonstration  HOME EXERCISE PROGRAM: Pt to continue HEP as previously given  01/27/23: Access Code: [email or web address] URL: [email or web address] Date: 01/27/2023 Prepared by: Ricard Dillon  Exercises - Shoulder External Rotation and Scapular Retraction with Resistance  - 1 x daily - 5 x weekly - 3 sets - 12 reps  1.29: Access Code: [email or web address] URL: [email or web address] Date: 01/06/2023 Prepared by: Ricard Dillon  Exercises - Sit to Stand with Counter  Support  - 1 x daily - 5 x weekly - 3 sets - 10 reps Access Code: [email or web address] URL: [email or web address] Date: 12/31/2022 Prepared by: Ricard Dillon  Exercises - Seated Gaze Stabilization with Head Rotation  - 1 x daily - 7 x weekly - 3 sets - 2 reps - 30 seconds hold   GOALS: Goals reviewed with patient? Yes  SHORT TERM GOALS: Target date: 03/17/2023   Patient will be independent in home exercise program to improve strength/mobility for better functional independence with ADLs. Baseline: initiated VOR HEP Goal status: INITIAL   LONG TERM GOALS: Target date: 04/28/2023    Patient will increase FOTO score to equal to or greater than  64  to demonstrate  improvement in mobility and quality of life. Baseline: 54 Goal status: INITIAL   2.  Patient will increase ABC scale score >80% to demonstrate better functional mobility and better confidence with ADLs.  Baseline: to be assessed next 1-2 visits Goal status: INITIAL  4.  Patient will increase dynamic gait index score to >21/24 as to demonstrate reduced fall risk and improved dynamic gait balance for better safety with community/home ambulation.   Baseline: 19/24 Goal status: INITIAL     ASSESSMENT:  CLINICAL IMPRESSION: Pt  continues to respond well to manual therapy exhibiting improved fluidity of cervical AROM and ease of movement in session. Pt with no symptoms with VOR. She did have difficulty with both dynamic and static tandem stance positioning. The pt will benefit from further skilled PT to address these deficits in order to improve balance, dizziness and to decrease fall risk.    OBJECTIVE IMPAIRMENTS: Abnormal gait, decreased balance, decreased mobility, difficulty walking, decreased ROM, dizziness, hypomobility, impaired sensation, improper body mechanics, postural dysfunction, and pain.   ACTIVITY LIMITATIONS: lifting, bending, standing, stairs, bed mobility, and locomotion level All activities can become  limited when pt dizzy  PARTICIPATION LIMITATIONS: meal prep, cleaning, laundry, shopping, community activity, and yard work  PERSONAL FACTORS: Age, Past/current experiences, Sex, and 3+ comorbidities: PMH: arthritis, anxiety, migraines, HTN, depression, skin cancer, allergy, lumbar back pain, chronic venous insufficiency, prediabetes, vertigo, UTI, chest pain, abnormal chest x-ray, restless leg syndrome, lower extremity pain diffuse, generalized anxiety disorder, primary OA of both knees, lumbosacral radiculopathy due to intervertebral disc disorder, bradycardia, LBBB, cellulitis of RLE without foot, acute otitis media, she reports she had low  back and neck surgery in her 22s due to degeneration of a vertebra, she reports this procedure was a fusion.  are also affecting patient's functional outcome.   REHAB POTENTIAL: Good  CLINICAL DECISION MAKING: Evolving/moderate complexity  EVALUATION COMPLEXITY: Moderate   PLAN:  PT FREQUENCY: 2x/week  PT DURATION: 12 weeks  PLANNED INTERVENTIONS: Therapeutic exercises, Therapeutic activity, Neuromuscular re-education, Balance training, Gait training, Patient/Family education, Self Care, Joint mobilization, Stair training, Vestibular training, Canalith repositioning, Visual/preceptual remediation/compensation, Orthotic/Fit training, DME instructions, Wheelchair mobility training, Spinal mobilization, Cryotherapy, Moist heat, Taping, Ultrasound, Manual therapy, and Re-evaluation  PLAN FOR NEXT SESSION: cervical ROM, balance, VOR, strength   Zollie Pee, PT 02/03/2023, 1:55 PM

## 2023-02-06 ENCOUNTER — Ambulatory Visit: Payer: Medicare HMO

## 2023-02-06 DIAGNOSIS — M6281 Muscle weakness (generalized): Secondary | ICD-10-CM

## 2023-02-06 DIAGNOSIS — R2681 Unsteadiness on feet: Secondary | ICD-10-CM

## 2023-02-06 DIAGNOSIS — R262 Difficulty in walking, not elsewhere classified: Secondary | ICD-10-CM | POA: Diagnosis not present

## 2023-02-06 DIAGNOSIS — M542 Cervicalgia: Secondary | ICD-10-CM | POA: Diagnosis not present

## 2023-02-06 DIAGNOSIS — R42 Dizziness and giddiness: Secondary | ICD-10-CM | POA: Diagnosis not present

## 2023-02-06 NOTE — Therapy (Signed)
OUTPATIENT PHYSICAL THERAPY VESTIBULAR TREATMENT NOTE     Patient Name: Sandra Davidson MRN: [id number] DOB:[birth year], 87 y.o., female Today's Date: 02/06/2023  END OF SESSION:  PT End of Session - 02/06/23 1741     Visit Number 9    Number of Visits 25    Date for PT Re-Evaluation 03/25/23    PT Start Time [id number]    PT Stop Time 1557    PT Time Calculation (min) 41 min    Equipment Utilized During Treatment Gait belt    Activity Tolerance Patient tolerated treatment well;No increased pain    Behavior During Therapy WFL for tasks assessed/performed               Past Medical History:  Diagnosis Date   Allergy    Anxiety    Arthritis    Cancer ([place])    skin   Depression    GERD (gastroesophageal reflux disease)    Hypertension    Migraines    Past Surgical History:  Procedure Laterality Date   BACK SURGERY     MOHS SURGERY     NECK SURGERY     skin cancer removed  12/14/2020   TONSILLECTOMY     WRIST SURGERY Right    Patient Active Problem List   Diagnosis Date Noted   Vaginal yeast infection 04/26/2020   Encounter for long-term (current) use of medications 10/27/2019   Acute otitis media 08/25/2019   Cellulitis of right lower extremity without foot 08/02/2019   Impaired fasting glucose 09/03/2018   Lumbosacral radiculopathy due to intervertebral disc disorder 09/03/2018   Primary osteoarthritis of both knees 07/27/2018   Encounter for general adult medical examination with abnormal findings 07/18/2018   Lower extremity pain, diffuse 07/18/2018   Generalized anxiety disorder 07/18/2018   Need for vaccination against Streptococcus pneumoniae using pneumococcal conjugate vaccine 13 07/18/2018   Bradycardia 06/18/2018   LBBB (left bundle branch block) 05/19/2018   Dysuria 04/22/2018   Restless leg syndrome, controlled 04/22/2018   Benign essential HTN 01/08/2018   Vertigo 01/07/2018   Chest pain 01/07/2018   UTI (urinary tract infection) 01/07/2018    Abnormal chest x-ray 01/07/2018   Prediabetes 05/16/2017   Onychomycosis 05/16/2017   Essential hypertension 02/13/2017   Skin cancer 02/13/2017   Anxiety and depression 02/13/2017   Osteoarthritis 02/13/2017   Chronic venous insufficiency 09/26/2016   Lumbar back pain 09/26/2016   Neuropathy 09/26/2016    PCP: None listed REFERRING PROVIDER:   Carloyn Manner, MD    REFERRING DIAG:  R42 (ICD-10-CM) - Dizziness and giddiness  R26.89 (ICD-10-CM) - Imbalance    THERAPY DIAG:  Unsteadiness on feet  Muscle weakness (generalized)  Difficulty in walking, not elsewhere classified  ONSET DATE: Sept 2023  Rationale for Evaluation and Treatment: Rehabilitation  SUBJECTIVE:   SUBJECTIVE STATEMENT: Pt reports no updates, no stumbles/falls. Things have been going well. Pt accompanied by: self  PERTINENT HISTORY:   Per referral the pt is an 87 yo female with recent hx of bilateral sharp, chronic ear pain (1 yr) of gradual onset, L worse than R, worsening, with feelings of fullness and tinnitus of both ears, and vertigo. Pt also with sensorineural hearing loss. PT went to ED 08/18/2022 for vertigo, was prescribed Meclizine. She has not had spinning since, but continues to feel unsteady with lightheaded or swimmy-headed sensation. She is unsure what brings on her symptoms. PMH: arthritis, anxiety, migraines, HTN, depression, skin cancer, allergy, lumbar back pain, chronic venous insufficiency, prediabetes,  vertigo, UTI, chest pain, abnormal chest x-ray, restless leg syndrome, lower extremity pain diffuse, generalized anxiety disorder, primary OA of both knees, lumbosacral radiculopathy due to intervertebral disc disorder, bradycardia, LBBB, cellulitis of RLE without foot, acute otitis media, she reports she had low back and neck surgery in her 20s due to degeneration of a vertebra, she reports this procedure was a fusion.  Description of symptoms: dizziness is described as a  lightheadedness mostly. She reports one instance of spinning (when she was laying down watching TV). She reports even when she closed her eyes she still felt the TV was moving. The spinning sensation lasted for 10-15 minutes. She has not had the spinning since. Pt reports she used to get migraines, but it has been years (30-40 years) and that at the time she had them fairly often with severe headaches. She has headaches every now and then but nothing like she used to. Pt's remaining symptoms are describes as a lightheadedness and "kind of" a swimmy-headedness and "getting sick on my stomach."  PAIN:  Are you having pain? Yes: NPRS scale: not rated/10 Pain location: Low back Pain description:   Aggravating factors: lifting something heavy Relieving factors: heat, ice  PRECAUTIONS: Fall, does have hx of fusions (cervical spine fusion, and a different procedure for her low back but she is unsure what)  WEIGHT BEARING RESTRICTIONS: No  FALLS: Has patient fallen in last 6 months? Yes. Number of falls 1  LIVING ENVIRONMENT: Lives with: lives alone Lives in: House/apartment Stairs: No Has following equipment at home: Single point cane, Environmental consultant - 2 wheeled, Environmental consultant - 4 wheeled, shower chair, and Ramped entry  PLOF: Independent  PATIENT GOALS: Get back to dancing, get rid of dizziness  OBJECTIVE:   DIAGNOSTIC FINDINGS:   US Carotid Bilateral 11/19/2022: "RIGHT CAROTID ARTERY: No significant calcifications of the right common carotid artery. Intermediate waveform maintained. Heterogeneous and partially calcified plaque at the right carotid bifurcation. No significant lumen shadowing. Low resistance waveform of the right ICA. No significant tortuosity.   RIGHT VERTEBRAL ARTERY: Antegrade flow with low resistance waveform.   LEFT CAROTID ARTERY: No significant calcifications of the left common carotid artery. Intermediate waveform maintained. Heterogeneous and partially calcified plaque at  the left carotid bifurcation without significant lumen shadowing. Low resistance waveform of the left ICA. No significant tortuosity.   LEFT VERTEBRAL ARTERY:  Antegrade flow with low resistance waveform.   IMPRESSION: Color duplex indicates minimal heterogeneous and calcified plaque, with no hemodynamically significant stenosis by duplex criteria in the extracranial cerebrovascular circulation."  MR BRAIN WO Contrast 08/18/2022: "IMPRESSION: Normal MRI appearance of the brain for age. No acute or focal abnormality to explain the patient's symptoms."  CT HEAD WO CONTRAST 08/18/2022: "IMPRESSION: 1. Stable mild atrophy and white matter disease. This likely reflects the sequela of chronic microvascular ischemia. 2. No acute intracranial abnormality or significant interval change."    COGNITION: Overall cognitive status: Within functional limits for tasks assessed   SENSATION: Pt reports she has neuropathy in LE  EDEMA:  None observed    POSTURE:  increased thoracic kyphosis  Cervical ROM:    Pt observed to have limited cervical ext, reports this is a result from cervical fusion decades ago  STRENGTH:   LOWER EXTREMITY MMT:   Taken 01/06/23 MMT: strength grossly 4/5 in BLE  BED MOBILITY:  Impaired when pt has symptoms/dizziness, otherwise indep  TRANSFERS: Assistive device utilized: None  Sit to stand: Complete Independence Stand to sit: Complete Independence  GAIT: Gait pattern: pt ambulated clinic distances without AD. She is unsteady with ambulating with head turns (horizontal>vertical), vertical head turns are somewhat limited due to hx of cervical fusions from decades ago.  FUNCTIONAL TESTS:  Dynamic Gait Index: 19/24  PATIENT SURVEYS:  ABC scale deferred FOTO 54  VESTIBULAR ASSESSMENT:  GENERAL OBSERVATION: reports FOF, uncertain of what brings on her symptoms, impacts ADLs generally when dizziness occurs   SYMPTOM BEHAVIOR:  Subjective history:  Pt seen in ED Sept 2023 due to dizziness, was given Meclizine. Imaging reassuring per report. Pt then sent to ENT, found to have hearing loss, pt with chronic B ear pain (worse on L), and sense of fullness in ears with occ ringing. Pt feels things are generally worse on her L side. She has had only 1 recent instance of spinning that lasted for minutes. Other symptoms have been a lightheadedness or swimmy-headedness. Pt can become nauseated and vomit.   Non-Vestibular symptoms: changes in hearing, headaches, tinnitus, nausea/vomiting, and loss of consciousness  Type of dizziness: Spinning/Vertigo, Lightheadedness/Faint, and "Swimmyheaded" and unsteadiness.  Frequency: pt unsure  Duration: spinning lasted for minutes, but pt can be nauseated and feel off for entire day  Aggravating factors:  pt unsure    OCULOMOTOR EXAM:  Ocular Alignment: normal  Ocular ROM: No Limitations  Spontaneous Nystagmus: absent  Gaze-Induced Nystagmus: absent  Smooth Pursuits:  majority of movement smooth, did note 3 saccadic movements with vertical gaze   Saccades: intact  Convergence/Divergence: Sees double at approx 6" from nose   VESTIBULAR - OCULAR REFLEX:   Slow VOR: Normal  Head-Impulse Test:   - Catch up saccade noted 1x with testing of R side  -WNL to L  Dynamic Visual Acuity: Impaired, change from line 8 to 5    POSITIONAL TESTING:   Roll testing: negative B Dix-Hallpike: negative B      OTHOSTATICS:  Taken 01/06/23 Orthostatics- Supine: 126/57 mmHg, HR 59 bpm Seated: 108/49 mmHg, HR 59 bpm Standing: 115/55 mmHg, HR 69 bpm She reports no dizziness/lightheadedness throughout orthostatic testing  FUNCTIONAL GAIT: Dynamic Gait Index: 19/24, greatest deficits with obstacle clearance, gait with head turns, change in gait speed   VESTIBULAR TREATMENT:                                                                                                   DATE:    Manual:   Pt supine on plinth. PT  provides STM and TrP release to bilateral cervical paraspinals and muscles of occipital triangle. Pt continues to be TTP throughout bilat cervical paraspinals and muscles of occipital triangle. Additionally, PT cues pt through gentle, sustained stretching into lateral cervical flexion with cervical rotation,isolated cervical rotation bilaterally and cervical flexion. Pt continues to report intervention feels good.   TherEx STS 15x Staggered STS 10x each LE Standing hip abd with 4# AW  3x10 each LE Standing hip ext 2x10 each LE Seated LAQ with 4# AW 20x each LE   NMR:   Over 10 meters: Tandem gait 4x CGA-min a High knee marching 2x Gait with EC 4x  In long hallway: Reading sticky notes to utilize mix of horizontal and vertical head turns 2x length of hallway. Decreased gait speed, stops at times SLB 2x30 sec each LE Tandem stance 2x30 sec each LE - very steady    PATIENT EDUCATION: Education details: Pt educated throughout session about proper posture and technique with exercises. Improved exercise technique, movement at target joints, use of target muscles after min to mod verbal, visual, tactile cues.  Person educated: Patient Education method: Explanation, Demonstration, Tactile cues, and Verbal cues Education comprehension: verbalized understanding and returned demonstration  HOME EXERCISE PROGRAM: Pt to continue HEP as previously given  01/27/23: Access Code: [email or web address] URL: [email or web address] Date: 01/27/2023 Prepared by: Ricard Dillon  Exercises - Shoulder External Rotation and Scapular Retraction with Resistance  - 1 x daily - 5 x weekly - 3 sets - 12 reps  1.29: Access Code: [email or web address] URL: [email or web address] Date: 01/06/2023 Prepared by: Ricard Dillon  Exercises - Sit to Stand with Counter Support  - 1 x daily - 5 x weekly - 3 sets - 10 reps Access Code: [email or web address] URL: [email or web address] Date: 12/31/2022 Prepared  by: Ricard Dillon  Exercises - Seated Gaze Stabilization with Head Rotation  - 1 x daily - 7 x weekly - 3 sets - 2 reps - 30 seconds hold   GOALS: Goals reviewed with patient? Yes  SHORT TERM GOALS: Target date: 03/20/2023   Patient will be independent in home exercise program to improve strength/mobility for better functional independence with ADLs. Baseline: initiated VOR HEP Goal status: INITIAL   LONG TERM GOALS: Target date: 05/01/2023    Patient will increase FOTO score to equal to or greater than  64  to demonstrate  improvement in mobility and quality of life. Baseline: 54 Goal status: INITIAL   2.  Patient will increase ABC scale score >80% to demonstrate better functional mobility and better confidence with ADLs.  Baseline: to be assessed next 1-2 visits Goal status: INITIAL  4.  Patient will increase dynamic gait index score to >21/24 as to demonstrate reduced fall risk and improved dynamic gait balance for better safety with community/home ambulation.   Baseline: 19/24 Goal status: INITIAL     ASSESSMENT:  CLINICAL IMPRESSION: Pt exhibits good static tandem stance balance this session with no UE support and minimal sway. However, pt still has difficulty with tandem gait, requiring close CGA with frequent step strategy to correct for unsteadiness. Pt with no symptoms with interventions. The pt will benefit from further skilled PT to address these deficits in order to improve balance, dizziness and to decrease fall risk.    OBJECTIVE IMPAIRMENTS: Abnormal gait, decreased balance, decreased mobility, difficulty walking, decreased ROM, dizziness, hypomobility, impaired sensation, improper body mechanics, postural dysfunction, and pain.   ACTIVITY LIMITATIONS: lifting, bending, standing, stairs, bed mobility, and locomotion level All activities can become limited when pt dizzy  PARTICIPATION LIMITATIONS: meal prep, cleaning, laundry, shopping, community activity, and  yard work  PERSONAL FACTORS: Age, Past/current experiences, Sex, and 3+ comorbidities: PMH: arthritis, anxiety, migraines, HTN, depression, skin cancer, allergy, lumbar back pain, chronic venous insufficiency, prediabetes, vertigo, UTI, chest pain, abnormal chest x-ray, restless leg syndrome, lower extremity pain diffuse, generalized anxiety disorder, primary OA of both knees, lumbosacral radiculopathy due to intervertebral disc disorder, bradycardia, LBBB, cellulitis of RLE without foot, acute otitis media, she reports she had low back and neck surgery in her 20s due to degeneration of a vertebra, she reports this procedure was a fusion.  are also affecting patient's  functional outcome.   REHAB POTENTIAL: Good  CLINICAL DECISION MAKING: Evolving/moderate complexity  EVALUATION COMPLEXITY: Moderate   PLAN:  PT FREQUENCY: 2x/week  PT DURATION: 12 weeks  PLANNED INTERVENTIONS: Therapeutic exercises, Therapeutic activity, Neuromuscular re-education, Balance training, Gait training, Patient/Family education, Self Care, Joint mobilization, Stair training, Vestibular training, Canalith repositioning, Visual/preceptual remediation/compensation, Orthotic/Fit training, DME instructions, Wheelchair mobility training, Spinal mobilization, Cryotherapy, Moist heat, Taping, Ultrasound, Manual therapy, and Re-evaluation  PLAN FOR NEXT SESSION: cervical ROM, balance, VOR, strength   Zollie Pee, PT 02/06/2023, 5:45 PM

## 2023-02-10 ENCOUNTER — Ambulatory Visit: Payer: Medicare HMO | Attending: Otolaryngology

## 2023-02-10 DIAGNOSIS — R2681 Unsteadiness on feet: Secondary | ICD-10-CM | POA: Insufficient documentation

## 2023-02-10 DIAGNOSIS — M542 Cervicalgia: Secondary | ICD-10-CM | POA: Diagnosis not present

## 2023-02-10 DIAGNOSIS — M6281 Muscle weakness (generalized): Secondary | ICD-10-CM | POA: Insufficient documentation

## 2023-02-10 NOTE — Therapy (Signed)
OUTPATIENT PHYSICAL THERAPY VESTIBULAR TREATMENT NOTE/Physical Therapy Progress Note   Dates of reporting period  12/31/2022   to   02/10/2023      Patient Name: Sandra Davidson MRN: [id number] DOB:October 29, 1936, 87 y.o., female Today's Date: 02/10/2023  END OF SESSION:  PT End of Session - 02/10/23 1050     Visit Number 10    Number of Visits 25    Date for PT Re-Evaluation 03/25/23    PT Start Time 1050    PT Stop Time 1135    PT Time Calculation (min) 45 min    Equipment Utilized During Treatment Gait belt    Activity Tolerance Patient tolerated treatment well    Behavior During Therapy WFL for tasks assessed/performed               Past Medical History:  Diagnosis Date   Allergy    Anxiety    Arthritis    Cancer ([place])    skin   Depression    GERD (gastroesophageal reflux disease)    Hypertension    Migraines    Past Surgical History:  Procedure Laterality Date   BACK SURGERY     MOHS SURGERY     NECK SURGERY     skin cancer removed  12/14/2020   TONSILLECTOMY     WRIST SURGERY Right    Patient Active Problem List   Diagnosis Date Noted   Vaginal yeast infection 04/26/2020   Encounter for long-term (current) use of medications 10/27/2019   Acute otitis media 08/25/2019   Cellulitis of right lower extremity without foot 08/02/2019   Impaired fasting glucose 09/03/2018   Lumbosacral radiculopathy due to intervertebral disc disorder 09/03/2018   Primary osteoarthritis of both knees 07/27/2018   Encounter for general adult medical examination with abnormal findings 07/18/2018   Lower extremity pain, diffuse 07/18/2018   Generalized anxiety disorder 07/18/2018   Need for vaccination against Streptococcus pneumoniae using pneumococcal conjugate vaccine 13 07/18/2018   Bradycardia 06/18/2018   LBBB (left bundle branch block) 05/19/2018   Dysuria 04/22/2018   Restless leg syndrome, controlled 04/22/2018   Benign essential HTN 01/08/2018   Vertigo 01/07/2018    Chest pain 01/07/2018   UTI (urinary tract infection) 01/07/2018   Abnormal chest x-ray 01/07/2018   Prediabetes 05/16/2017   Onychomycosis 05/16/2017   Essential hypertension 02/13/2017   Skin cancer 02/13/2017   Anxiety and depression 02/13/2017   Osteoarthritis 02/13/2017   Chronic venous insufficiency 09/26/2016   Lumbar back pain 09/26/2016   Neuropathy 09/26/2016    PCP: None listed REFERRING PROVIDER:   Carloyn Manner, MD    REFERRING DIAG:  R42 (ICD-10-CM) - Dizziness and giddiness  R26.89 (ICD-10-CM) - Imbalance    THERAPY DIAG:  Unsteadiness on feet  Cervicalgia  ONSET DATE: Sept 2023  Rationale for Evaluation and Treatment: Rehabilitation  SUBJECTIVE:   SUBJECTIVE STATEMENT: Pt reports continued ear fullness that relieves for a second when she bends over. She reports she thinks it doesn't feel quite as heavy/full. Pt accompanied by: self  PERTINENT HISTORY:   Per referral the pt is an 87 yo female with recent hx of bilateral sharp, chronic ear pain (1 yr) of gradual onset, L worse than R, worsening, with feelings of fullness and tinnitus of both ears, and vertigo. Pt also with sensorineural hearing loss. PT went to ED 08/18/2022 for vertigo, was prescribed Meclizine. She has not had spinning since, but continues to feel unsteady with lightheaded or swimmy-headed sensation. She is unsure what  brings on her symptoms. PMH: arthritis, anxiety, migraines, HTN, depression, skin cancer, allergy, lumbar back pain, chronic venous insufficiency, prediabetes, vertigo, UTI, chest pain, abnormal chest x-ray, restless leg syndrome, lower extremity pain diffuse, generalized anxiety disorder, primary OA of both knees, lumbosacral radiculopathy due to intervertebral disc disorder, bradycardia, LBBB, cellulitis of RLE without foot, acute otitis media, she reports she had low back and neck surgery in her 20s due to degeneration of a vertebra, she reports this procedure was a  fusion.  Description of symptoms: dizziness is described as a lightheadedness mostly. She reports one instance of spinning (when she was laying down watching TV). She reports even when she closed her eyes she still felt the TV was moving. The spinning sensation lasted for 10-15 minutes. She has not had the spinning since. Pt reports she used to get migraines, but it has been years (30-40 years) and that at the time she had them fairly often with severe headaches. She has headaches every now and then but nothing like she used to. Pt's remaining symptoms are describes as a lightheadedness and "kind of" a swimmy-headedness and "getting sick on my stomach."  PAIN:  Are you having pain? Yes: NPRS scale: not rated/10 Pain location: Low back Pain description:   Aggravating factors: lifting something heavy Relieving factors: heat, ice  PRECAUTIONS: Fall, does have hx of fusions (cervical spine fusion, and a different procedure for her low back but she is unsure what)  WEIGHT BEARING RESTRICTIONS: No  FALLS: Has patient fallen in last 6 months? Yes. Number of falls 1  LIVING ENVIRONMENT: Lives with: lives alone Lives in: House/apartment Stairs: No Has following equipment at home: Single point cane, Environmental consultant - 2 wheeled, Environmental consultant - 4 wheeled, shower chair, and Ramped entry  PLOF: Independent  PATIENT GOALS: Get back to dancing, get rid of dizziness  OBJECTIVE:   DIAGNOSTIC FINDINGS:   US Carotid Bilateral 11/19/2022: "RIGHT CAROTID ARTERY: No significant calcifications of the right common carotid artery. Intermediate waveform maintained. Heterogeneous and partially calcified plaque at the right carotid bifurcation. No significant lumen shadowing. Low resistance waveform of the right ICA. No significant tortuosity.   RIGHT VERTEBRAL ARTERY: Antegrade flow with low resistance waveform.   LEFT CAROTID ARTERY: No significant calcifications of the left common carotid artery. Intermediate  waveform maintained. Heterogeneous and partially calcified plaque at the left carotid bifurcation without significant lumen shadowing. Low resistance waveform of the left ICA. No significant tortuosity.   LEFT VERTEBRAL ARTERY:  Antegrade flow with low resistance waveform.   IMPRESSION: Color duplex indicates minimal heterogeneous and calcified plaque, with no hemodynamically significant stenosis by duplex criteria in the extracranial cerebrovascular circulation."  MR BRAIN WO Contrast 08/18/2022: "IMPRESSION: Normal MRI appearance of the brain for age. No acute or focal abnormality to explain the patient's symptoms."  CT HEAD WO CONTRAST 08/18/2022: "IMPRESSION: 1. Stable mild atrophy and white matter disease. This likely reflects the sequela of chronic microvascular ischemia. 2. No acute intracranial abnormality or significant interval change."    COGNITION: Overall cognitive status: Within functional limits for tasks assessed   SENSATION: Pt reports she has neuropathy in LE  EDEMA:  None observed    POSTURE:  increased thoracic kyphosis  Cervical ROM:    Pt observed to have limited cervical ext, reports this is a result from cervical fusion decades ago  STRENGTH:   LOWER EXTREMITY MMT:   Taken 01/06/23 MMT: strength grossly 4/5 in BLE  BED MOBILITY:  Impaired when pt has symptoms/dizziness, otherwise  indep  TRANSFERS: Assistive device utilized: None  Sit to stand: Complete Independence Stand to sit: Complete Independence   GAIT: Gait pattern: pt ambulated clinic distances without AD. She is unsteady with ambulating with head turns (horizontal>vertical), vertical head turns are somewhat limited due to hx of cervical fusions from decades ago.  FUNCTIONAL TESTS:  Dynamic Gait Index: 19/24  PATIENT SURVEYS:  ABC scale deferred FOTO 54  VESTIBULAR ASSESSMENT:  GENERAL OBSERVATION: reports FOF, uncertain of what brings on her symptoms, impacts ADLs  generally when dizziness occurs   SYMPTOM BEHAVIOR:  Subjective history: Pt seen in ED Sept 2023 due to dizziness, was given Meclizine. Imaging reassuring per report. Pt then sent to ENT, found to have hearing loss, pt with chronic B ear pain (worse on L), and sense of fullness in ears with occ ringing. Pt feels things are generally worse on her L side. She has had only 1 recent instance of spinning that lasted for minutes. Other symptoms have been a lightheadedness or swimmy-headedness. Pt can become nauseated and vomit.   Non-Vestibular symptoms: changes in hearing, headaches, tinnitus, nausea/vomiting, and loss of consciousness  Type of dizziness: Spinning/Vertigo, Lightheadedness/Faint, and "Swimmyheaded" and unsteadiness.  Frequency: pt unsure  Duration: spinning lasted for minutes, but pt can be nauseated and feel off for entire day  Aggravating factors:  pt unsure    OCULOMOTOR EXAM:  Ocular Alignment: normal  Ocular ROM: No Limitations  Spontaneous Nystagmus: absent  Gaze-Induced Nystagmus: absent  Smooth Pursuits:  majority of movement smooth, did note 3 saccadic movements with vertical gaze   Saccades: intact  Convergence/Divergence: Sees double at approx 6" from nose   VESTIBULAR - OCULAR REFLEX:   Slow VOR: Normal  Head-Impulse Test:   - Catch up saccade noted 1x with testing of R side  -WNL to L  Dynamic Visual Acuity: Impaired, change from line 8 to 5    POSITIONAL TESTING:   Roll testing: negative B Dix-Hallpike: negative B      OTHOSTATICS:  Taken 01/06/23 Orthostatics- Supine: 126/57 mmHg, HR 59 bpm Seated: 108/49 mmHg, HR 59 bpm Standing: 115/55 mmHg, HR 69 bpm She reports no dizziness/lightheadedness throughout orthostatic testing  FUNCTIONAL GAIT: Dynamic Gait Index: 19/24, greatest deficits with obstacle clearance, gait with head turns, change in gait speed   VESTIBULAR TREATMENT:                                                                                                    DATE:   NMR: Goals reassessed for progress note. Please refer to goal section for details.  Tandem gait 4x Stairs ascend/descend 6x UUE support, close CGA  Manual:   Pt supine on plinth. PT provides STM and TrP release to bilateral cervical paraspinals and muscles of occipital triangle. Pt TTP throughout bilat cervical paraspinals and muscles of occipital triangle. Additionally, PT cues pt through gentle, sustained stretching into lateral cervical flexion with cervical rotation,isolated cervical rotation bilaterally and cervical flexion. Pt with improved fluidity of movement    PATIENT EDUCATION: Education details: Pt educated throughout session about proper posture and technique  with exercises. Improved exercise technique, movement at target joints, use of target muscles after min to mod verbal, visual, tactile cues.  Person educated: Patient Education method: Explanation, Demonstration, Tactile cues, and Verbal cues Education comprehension: verbalized understanding and returned demonstration  HOME EXERCISE PROGRAM: Pt to continue HEP as previously given  01/27/23: Access Code: [email or web address] URL: [email or web address] Date: 01/27/2023 Prepared by: Ricard Dillon  Exercises - Shoulder External Rotation and Scapular Retraction with Resistance  - 1 x daily - 5 x weekly - 3 sets - 12 reps  1.29: Access Code: [email or web address] URL: [email or web address] Date: 01/06/2023 Prepared by: Ricard Dillon  Exercises - Sit to Stand with Counter Support  - 1 x daily - 5 x weekly - 3 sets - 10 reps Access Code: [email or web address] URL: [email or web address] Date: 12/31/2022 Prepared by: Ricard Dillon  Exercises - Seated Gaze Stabilization with Head Rotation  - 1 x daily - 7 x weekly - 3 sets - 2 reps - 30 seconds hold   GOALS: Goals reviewed with patient? Yes  SHORT TERM GOALS: Target date: 03/24/2023   Patient will be independent in home  exercise program to improve strength/mobility for better functional independence with ADLs. Baseline: initiated VOR HEP; 3/4: Pt independent HEP Goal status: MET   LONG TERM GOALS: Target date: 05/05/2023    Patient will increase FOTO score to equal to or greater than  64  to demonstrate  improvement in mobility and quality of life. Baseline: 54; 3/4; 49 Goal status: Ongoing   2.  Patient will increase ABC scale score >80% to demonstrate better functional mobility and better confidence with ADLs.  Baseline: to be assessed next 1-2 visits; 3/4: 75.63% Goal status: Partially met  4.  Patient will increase dynamic gait index score to >21/24 as to demonstrate reduced fall risk and improved dynamic gait balance for better safety with community/home ambulation.   Baseline: 19/24; 3/4: 21/24 Goal status: Partially MET     ASSESSMENT:  CLINICAL IMPRESSION: Goals reassessed for progress note. Pt has partially met DGI goal and ABC scale goal, indicating improvement in balance and nearing goal score for balance confidence. While pt shows progress, she did see decrease in FOTO score which may have in part been impacted by PT assisting pt answer questions this time, whereas pt initially answered Qs independently. This decrease indicates pt still with deficit in perception of functional mobility and QOL. The pt will benefit from further skilled PT to address these deficits in order to improve balance, dizziness and to decrease fall risk.    OBJECTIVE IMPAIRMENTS: Abnormal gait, decreased balance, decreased mobility, difficulty walking, decreased ROM, dizziness, hypomobility, impaired sensation, improper body mechanics, postural dysfunction, and pain.   ACTIVITY LIMITATIONS: lifting, bending, standing, stairs, bed mobility, and locomotion level All activities can become limited when pt dizzy  PARTICIPATION LIMITATIONS: meal prep, cleaning, laundry, shopping, community activity, and yard  work  PERSONAL FACTORS: Age, Past/current experiences, Sex, and 3+ comorbidities: PMH: arthritis, anxiety, migraines, HTN, depression, skin cancer, allergy, lumbar back pain, chronic venous insufficiency, prediabetes, vertigo, UTI, chest pain, abnormal chest x-ray, restless leg syndrome, lower extremity pain diffuse, generalized anxiety disorder, primary OA of both knees, lumbosacral radiculopathy due to intervertebral disc disorder, bradycardia, LBBB, cellulitis of RLE without foot, acute otitis media, she reports she had low back and neck surgery in her 20s due to degeneration of a vertebra, she reports this procedure was a fusion.  are also affecting patient's functional outcome.   REHAB POTENTIAL: Good  CLINICAL DECISION MAKING: Evolving/moderate  complexity  EVALUATION COMPLEXITY: Moderate   PLAN:  PT FREQUENCY: 2x/week  PT DURATION: 12 weeks  PLANNED INTERVENTIONS: Therapeutic exercises, Therapeutic activity, Neuromuscular re-education, Balance training, Gait training, Patient/Family education, Self Care, Joint mobilization, Stair training, Vestibular training, Canalith repositioning, Visual/preceptual remediation/compensation, Orthotic/Fit training, DME instructions, Wheelchair mobility training, Spinal mobilization, Cryotherapy, Moist heat, Taping, Ultrasound, Manual therapy, and Re-evaluation  PLAN FOR NEXT SESSION: cervical ROM, balance, VOR, strength   Zollie Pee, PT 02/10/2023, 1:25 PM

## 2023-02-13 ENCOUNTER — Ambulatory Visit: Payer: Medicare HMO

## 2023-02-13 DIAGNOSIS — R2681 Unsteadiness on feet: Secondary | ICD-10-CM

## 2023-02-13 DIAGNOSIS — M542 Cervicalgia: Secondary | ICD-10-CM | POA: Diagnosis not present

## 2023-02-13 DIAGNOSIS — M6281 Muscle weakness (generalized): Secondary | ICD-10-CM | POA: Diagnosis not present

## 2023-02-13 NOTE — Therapy (Signed)
OUTPATIENT PHYSICAL THERAPY VESTIBULAR TREATMENT NOTE      Patient Name: Sandra Davidson MRN: [id number] DOB:06-Jul-1936, 87 y.o., female Today's Date: 02/13/2023  END OF SESSION:  PT End of Session - 02/13/23 1419     Visit Number 11    Number of Visits 25    Date for PT Re-Evaluation 03/25/23    PT Start Time [id number]    PT Stop Time 1505    PT Time Calculation (min) 40 min    Equipment Utilized During Treatment Gait belt    Activity Tolerance Patient tolerated treatment well    Behavior During Therapy WFL for tasks assessed/performed               Past Medical History:  Diagnosis Date   Allergy    Anxiety    Arthritis    Cancer ([place])    skin   Depression    GERD (gastroesophageal reflux disease)    Hypertension    Migraines    Past Surgical History:  Procedure Laterality Date   BACK SURGERY     MOHS SURGERY     NECK SURGERY     skin cancer removed  12/14/2020   TONSILLECTOMY     WRIST SURGERY Right    Patient Active Problem List   Diagnosis Date Noted   Vaginal yeast infection 04/26/2020   Encounter for long-term (current) use of medications 10/27/2019   Acute otitis media 08/25/2019   Cellulitis of right lower extremity without foot 08/02/2019   Impaired fasting glucose 09/03/2018   Lumbosacral radiculopathy due to intervertebral disc disorder 09/03/2018   Primary osteoarthritis of both knees 07/27/2018   Encounter for general adult medical examination with abnormal findings 07/18/2018   Lower extremity pain, diffuse 07/18/2018   Generalized anxiety disorder 07/18/2018   Need for vaccination against Streptococcus pneumoniae using pneumococcal conjugate vaccine 13 07/18/2018   Bradycardia 06/18/2018   LBBB (left bundle branch block) 05/19/2018   Dysuria 04/22/2018   Restless leg syndrome, controlled 04/22/2018   Benign essential HTN 01/08/2018   Vertigo 01/07/2018   Chest pain 01/07/2018   UTI (urinary tract infection) 01/07/2018   Abnormal chest  x-ray 01/07/2018   Prediabetes 05/16/2017   Onychomycosis 05/16/2017   Essential hypertension 02/13/2017   Skin cancer 02/13/2017   Anxiety and depression 02/13/2017   Osteoarthritis 02/13/2017   Chronic venous insufficiency 09/26/2016   Lumbar back pain 09/26/2016   Neuropathy 09/26/2016    PCP: None listed REFERRING PROVIDER:   Carloyn Manner, MD    REFERRING DIAG:  R42 (ICD-10-CM) - Dizziness and giddiness  R26.89 (ICD-10-CM) - Imbalance    THERAPY DIAG:  Unsteadiness on feet  Muscle weakness (generalized)  ONSET DATE: Sept 2023  Rationale for Evaluation and Treatment: Rehabilitation  SUBJECTIVE:   SUBJECTIVE STATEMENT: Pt reports continued difficulty with tandem stance. She has been performing her HEP. She reports no falls, she has not been dizzy or sick. Pt accompanied by: self  PERTINENT HISTORY:   Per referral the pt is an 87 yo female with recent hx of bilateral sharp, chronic ear pain (1 yr) of gradual onset, L worse than R, worsening, with feelings of fullness and tinnitus of both ears, and vertigo. Pt also with sensorineural hearing loss. PT went to ED 08/18/2022 for vertigo, was prescribed Meclizine. She has not had spinning since, but continues to feel unsteady with lightheaded or swimmy-headed sensation. She is unsure what brings on her symptoms. PMH: arthritis, anxiety, migraines, HTN, depression, skin cancer, allergy, lumbar back  pain, chronic venous insufficiency, prediabetes, vertigo, UTI, chest pain, abnormal chest x-ray, restless leg syndrome, lower extremity pain diffuse, generalized anxiety disorder, primary OA of both knees, lumbosacral radiculopathy due to intervertebral disc disorder, bradycardia, LBBB, cellulitis of RLE without foot, acute otitis media, she reports she had low back and neck surgery in her 20s due to degeneration of a vertebra, she reports this procedure was a fusion.  Description of symptoms: dizziness is described as a  lightheadedness mostly. She reports one instance of spinning (when she was laying down watching TV). She reports even when she closed her eyes she still felt the TV was moving. The spinning sensation lasted for 10-15 minutes. She has not had the spinning since. Pt reports she used to get migraines, but it has been years (30-40 years) and that at the time she had them fairly often with severe headaches. She has headaches every now and then but nothing like she used to. Pt's remaining symptoms are describes as a lightheadedness and "kind of" a swimmy-headedness and "getting sick on my stomach."  PAIN:  Are you having pain? Yes: NPRS scale: not rated/10 Pain location: Low back Pain description:   Aggravating factors: lifting something heavy Relieving factors: heat, ice  PRECAUTIONS: Fall, does have hx of fusions (cervical spine fusion, and a different procedure for her low back but she is unsure what)  WEIGHT BEARING RESTRICTIONS: No  FALLS: Has patient fallen in last 6 months? Yes. Number of falls 1  LIVING ENVIRONMENT: Lives with: lives alone Lives in: House/apartment Stairs: No Has following equipment at home: Single point cane, Environmental consultant - 2 wheeled, Environmental consultant - 4 wheeled, shower chair, and Ramped entry  PLOF: Independent  PATIENT GOALS: Get back to dancing, get rid of dizziness  OBJECTIVE:   DIAGNOSTIC FINDINGS:   US Carotid Bilateral 11/19/2022: "RIGHT CAROTID ARTERY: No significant calcifications of the right common carotid artery. Intermediate waveform maintained. Heterogeneous and partially calcified plaque at the right carotid bifurcation. No significant lumen shadowing. Low resistance waveform of the right ICA. No significant tortuosity.   RIGHT VERTEBRAL ARTERY: Antegrade flow with low resistance waveform.   LEFT CAROTID ARTERY: No significant calcifications of the left common carotid artery. Intermediate waveform maintained. Heterogeneous and partially calcified plaque at  the left carotid bifurcation without significant lumen shadowing. Low resistance waveform of the left ICA. No significant tortuosity.   LEFT VERTEBRAL ARTERY:  Antegrade flow with low resistance waveform.   IMPRESSION: Color duplex indicates minimal heterogeneous and calcified plaque, with no hemodynamically significant stenosis by duplex criteria in the extracranial cerebrovascular circulation."  MR BRAIN WO Contrast 08/18/2022: "IMPRESSION: Normal MRI appearance of the brain for age. No acute or focal abnormality to explain the patient's symptoms."  CT HEAD WO CONTRAST 08/18/2022: "IMPRESSION: 1. Stable mild atrophy and white matter disease. This likely reflects the sequela of chronic microvascular ischemia. 2. No acute intracranial abnormality or significant interval change."    COGNITION: Overall cognitive status: Within functional limits for tasks assessed   SENSATION: Pt reports she has neuropathy in LE  EDEMA:  None observed    POSTURE:  increased thoracic kyphosis  Cervical ROM:    Pt observed to have limited cervical ext, reports this is a result from cervical fusion decades ago  STRENGTH:   LOWER EXTREMITY MMT:   Taken 01/06/23 MMT: strength grossly 4/5 in BLE  BED MOBILITY:  Impaired when pt has symptoms/dizziness, otherwise indep  TRANSFERS: Assistive device utilized: None  Sit to stand: Complete Independence Stand to  sit: Complete Independence   GAIT: Gait pattern: pt ambulated clinic distances without AD. She is unsteady with ambulating with head turns (horizontal>vertical), vertical head turns are somewhat limited due to hx of cervical fusions from decades ago.  FUNCTIONAL TESTS:  Dynamic Gait Index: 19/24  PATIENT SURVEYS:  ABC scale deferred FOTO 54  VESTIBULAR ASSESSMENT:  GENERAL OBSERVATION: reports FOF, uncertain of what brings on her symptoms, impacts ADLs generally when dizziness occurs   SYMPTOM BEHAVIOR:  Subjective history:  Pt seen in ED Sept 2023 due to dizziness, was given Meclizine. Imaging reassuring per report. Pt then sent to ENT, found to have hearing loss, pt with chronic B ear pain (worse on L), and sense of fullness in ears with occ ringing. Pt feels things are generally worse on her L side. She has had only 1 recent instance of spinning that lasted for minutes. Other symptoms have been a lightheadedness or swimmy-headedness. Pt can become nauseated and vomit.   Non-Vestibular symptoms: changes in hearing, headaches, tinnitus, nausea/vomiting, and loss of consciousness  Type of dizziness: Spinning/Vertigo, Lightheadedness/Faint, and "Swimmyheaded" and unsteadiness.  Frequency: pt unsure  Duration: spinning lasted for minutes, but pt can be nauseated and feel off for entire day  Aggravating factors:  pt unsure    OCULOMOTOR EXAM:  Ocular Alignment: normal  Ocular ROM: No Limitations  Spontaneous Nystagmus: absent  Gaze-Induced Nystagmus: absent  Smooth Pursuits:  majority of movement smooth, did note 3 saccadic movements with vertical gaze   Saccades: intact  Convergence/Divergence: Sees double at approx 6" from nose   VESTIBULAR - OCULAR REFLEX:   Slow VOR: Normal  Head-Impulse Test:   - Catch up saccade noted 1x with testing of R side  -WNL to L  Dynamic Visual Acuity: Impaired, change from line 8 to 5    POSITIONAL TESTING:   Roll testing: negative B Dix-Hallpike: negative B      OTHOSTATICS:  Taken 01/06/23 Orthostatics- Supine: 126/57 mmHg, HR 59 bpm Seated: 108/49 mmHg, HR 59 bpm Standing: 115/55 mmHg, HR 69 bpm She reports no dizziness/lightheadedness throughout orthostatic testing  FUNCTIONAL GAIT: Dynamic Gait Index: 19/24, greatest deficits with obstacle clearance, gait with head turns, change in gait speed   VESTIBULAR TREATMENT:                                                                                                   DATE:   NMR:  Over 10 meters: Tandem gait  6x Gait with EC 6x. Intermittent step strategy.  Gait with vertical and horizontal head turns 4x of each. Pt reports vertical head turns are easier  High knee march gait 4x 60-80 ft per round. Difficulty with eccentric control  Orange hurdle fwd/bckwd and LTL stepping  for obstacle clearance pt uses intermittent UE support  SLB progression soccer ball circles CW/CC x 45 sec each  TherEx:  Nustep endurance training . PT provides cuing for technique, monitors pt for response throughout Lvl 1 x 2 min Lvl 2 x 1 min Lvl 3 x 1 min Lvl 2 x 1 min Lvl 1 x 1 min  STS 15x  Standing hip abduction 2x15 each LE  Single leg heel raise 2x10 each LE. Fatiguing   PATIENT EDUCATION: Education details: Pt educated throughout session about proper posture and technique with exercises. Improved exercise technique, movement at target joints, use of target muscles after min to mod verbal, visual, tactile cues.  Person educated: Patient Education method: Explanation, Demonstration, Tactile cues, and Verbal cues Education comprehension: verbalized understanding and returned demonstration  HOME EXERCISE PROGRAM: Pt to continue HEP as previously given  01/27/23: Access Code: [email or web address] URL: [email or web address] Date: 01/27/2023 Prepared by: Ricard Dillon  Exercises - Shoulder External Rotation and Scapular Retraction with Resistance  - 1 x daily - 5 x weekly - 3 sets - 12 reps  1.29: Access Code: [email or web address] URL: [email or web address] Date: 01/06/2023 Prepared by: Ricard Dillon  Exercises - Sit to Stand with Counter Support  - 1 x daily - 5 x weekly - 3 sets - 10 reps Access Code: [email or web address] URL: [email or web address] Date: 12/31/2022 Prepared by: Ricard Dillon  Exercises - Seated Gaze Stabilization with Head Rotation  - 1 x daily - 7 x weekly - 3 sets - 2 reps - 30 seconds hold   GOALS: Goals reviewed with patient? Yes  SHORT TERM GOALS: Target date:  03/27/2023   Patient will be independent in home exercise program to improve strength/mobility for better functional independence with ADLs. Baseline: initiated VOR HEP; 3/4: Pt independent HEP Goal status: MET   LONG TERM GOALS: Target date: 05/08/2023    Patient will increase FOTO score to equal to or greater than  64  to demonstrate  improvement in mobility and quality of life. Baseline: 54; 3/4; 49 Goal status: Ongoing   2.  Patient will increase ABC scale score >80% to demonstrate better functional mobility and better confidence with ADLs.  Baseline: to be assessed next 1-2 visits; 3/4: 75.63% Goal status: Partially met  4.  Patient will increase dynamic gait index score to >21/24 as to demonstrate reduced fall risk and improved dynamic gait balance for better safety with community/home ambulation.   Baseline: 19/24; 3/4: 21/24 Goal status: Partially MET     ASSESSMENT:  CLINICAL IMPRESSION: Pt with excellent motivation to participate in session. She responds to interventions well without dizziness. However, dynamic balance interventions continue to be challenging for pt. The pt will benefit from further skilled PT to address these deficits in order to improve balance, dizziness and to decrease fall risk.    OBJECTIVE IMPAIRMENTS: Abnormal gait, decreased balance, decreased mobility, difficulty walking, decreased ROM, dizziness, hypomobility, impaired sensation, improper body mechanics, postural dysfunction, and pain.   ACTIVITY LIMITATIONS: lifting, bending, standing, stairs, bed mobility, and locomotion level All activities can become limited when pt dizzy  PARTICIPATION LIMITATIONS: meal prep, cleaning, laundry, shopping, community activity, and yard work  PERSONAL FACTORS: Age, Past/current experiences, Sex, and 3+ comorbidities: PMH: arthritis, anxiety, migraines, HTN, depression, skin cancer, allergy, lumbar back pain, chronic venous insufficiency, prediabetes, vertigo,  UTI, chest pain, abnormal chest x-ray, restless leg syndrome, lower extremity pain diffuse, generalized anxiety disorder, primary OA of both knees, lumbosacral radiculopathy due to intervertebral disc disorder, bradycardia, LBBB, cellulitis of RLE without foot, acute otitis media, she reports she had low back and neck surgery in her 20s due to degeneration of a vertebra, she reports this procedure was a fusion.  are also affecting patient's functional outcome.   REHAB POTENTIAL: Good  CLINICAL DECISION MAKING: Evolving/moderate complexity  EVALUATION COMPLEXITY: Moderate   PLAN:  PT FREQUENCY: 2x/week  PT  DURATION: 12 weeks  PLANNED INTERVENTIONS: Therapeutic exercises, Therapeutic activity, Neuromuscular re-education, Balance training, Gait training, Patient/Family education, Self Care, Joint mobilization, Stair training, Vestibular training, Canalith repositioning, Visual/preceptual remediation/compensation, Orthotic/Fit training, DME instructions, Wheelchair mobility training, Spinal mobilization, Cryotherapy, Moist heat, Taping, Ultrasound, Manual therapy, and Re-evaluation  PLAN FOR NEXT SESSION: cervical ROM, balance, VOR, strength   Zollie Pee, PT 02/13/2023, 3:33 PM

## 2023-02-17 ENCOUNTER — Ambulatory Visit: Payer: Medicare HMO

## 2023-02-17 DIAGNOSIS — M6281 Muscle weakness (generalized): Secondary | ICD-10-CM | POA: Diagnosis not present

## 2023-02-17 DIAGNOSIS — R2681 Unsteadiness on feet: Secondary | ICD-10-CM | POA: Diagnosis not present

## 2023-02-17 DIAGNOSIS — M542 Cervicalgia: Secondary | ICD-10-CM | POA: Diagnosis not present

## 2023-02-17 NOTE — Therapy (Signed)
OUTPATIENT PHYSICAL THERAPY VESTIBULAR TREATMENT NOTE      Patient Name: Sandra Davidson MRN: [id number] DOB:1936-07-10, 87 y.o., female Today's Date: 02/17/2023  END OF SESSION:  PT End of Session - 02/17/23 1056     Visit Number 12    Number of Visits 25    Date for PT Re-Evaluation 03/25/23    PT Start Time 1056    PT Stop Time 1137    PT Time Calculation (min) 41 min    Equipment Utilized During Treatment Gait belt    Activity Tolerance Patient tolerated treatment well    Behavior During Therapy WFL for tasks assessed/performed               Past Medical History:  Diagnosis Date   Allergy    Anxiety    Arthritis    Cancer ([place])    skin   Depression    GERD (gastroesophageal reflux disease)    Hypertension    Migraines    Past Surgical History:  Procedure Laterality Date   BACK SURGERY     MOHS SURGERY     NECK SURGERY     skin cancer removed  12/14/2020   TONSILLECTOMY     WRIST SURGERY Right    Patient Active Problem List   Diagnosis Date Noted   Vaginal yeast infection 04/26/2020   Encounter for long-term (current) use of medications 10/27/2019   Acute otitis media 08/25/2019   Cellulitis of right lower extremity without foot 08/02/2019   Impaired fasting glucose 09/03/2018   Lumbosacral radiculopathy due to intervertebral disc disorder 09/03/2018   Primary osteoarthritis of both knees 07/27/2018   Encounter for general adult medical examination with abnormal findings 07/18/2018   Lower extremity pain, diffuse 07/18/2018   Generalized anxiety disorder 07/18/2018   Need for vaccination against Streptococcus pneumoniae using pneumococcal conjugate vaccine 13 07/18/2018   Bradycardia 06/18/2018   LBBB (left bundle branch block) 05/19/2018   Dysuria 04/22/2018   Restless leg syndrome, controlled 04/22/2018   Benign essential HTN 01/08/2018   Vertigo 01/07/2018   Chest pain 01/07/2018   UTI (urinary tract infection) 01/07/2018   Abnormal chest  x-ray 01/07/2018   Prediabetes 05/16/2017   Onychomycosis 05/16/2017   Essential hypertension 02/13/2017   Skin cancer 02/13/2017   Anxiety and depression 02/13/2017   Osteoarthritis 02/13/2017   Chronic venous insufficiency 09/26/2016   Lumbar back pain 09/26/2016   Neuropathy 09/26/2016    PCP: None listed REFERRING PROVIDER:   Carloyn Manner, MD    REFERRING DIAG:  R42 (ICD-10-CM) - Dizziness and giddiness  R26.89 (ICD-10-CM) - Imbalance    THERAPY DIAG:  Unsteadiness on feet  Muscle weakness (generalized)  ONSET DATE: Sept 2023  Rationale for Evaluation and Treatment: Rehabilitation  SUBJECTIVE:   SUBJECTIVE STATEMENT: Pt reports she has been feeling good. She reports she had cramps in her BLE Thursday and Friday night but none after that. Pt accompanied by: self  PERTINENT HISTORY:   Per referral the pt is an 87 yo female with recent hx of bilateral sharp, chronic ear pain (1 yr) of gradual onset, L worse than R, worsening, with feelings of fullness and tinnitus of both ears, and vertigo. Pt also with sensorineural hearing loss. PT went to ED 08/18/2022 for vertigo, was prescribed Meclizine. She has not had spinning since, but continues to feel unsteady with lightheaded or swimmy-headed sensation. She is unsure what brings on her symptoms. PMH: arthritis, anxiety, migraines, HTN, depression, skin cancer, allergy, lumbar back pain,  chronic venous insufficiency, prediabetes, vertigo, UTI, chest pain, abnormal chest x-ray, restless leg syndrome, lower extremity pain diffuse, generalized anxiety disorder, primary OA of both knees, lumbosacral radiculopathy due to intervertebral disc disorder, bradycardia, LBBB, cellulitis of RLE without foot, acute otitis media, she reports she had low back and neck surgery in her 20s due to degeneration of a vertebra, she reports this procedure was a fusion.  Description of symptoms: dizziness is described as a lightheadedness mostly.  She reports one instance of spinning (when she was laying down watching TV). She reports even when she closed her eyes she still felt the TV was moving. The spinning sensation lasted for 10-15 minutes. She has not had the spinning since. Pt reports she used to get migraines, but it has been years (30-40 years) and that at the time she had them fairly often with severe headaches. She has headaches every now and then but nothing like she used to. Pt's remaining symptoms are describes as a lightheadedness and "kind of" a swimmy-headedness and "getting sick on my stomach."  PAIN:  Are you having pain? Yes: NPRS scale: not rated/10 Pain location: Low back Pain description:   Aggravating factors: lifting something heavy Relieving factors: heat, ice  PRECAUTIONS: Fall, does have hx of fusions (cervical spine fusion, and a different procedure for her low back but she is unsure what)  WEIGHT BEARING RESTRICTIONS: No  FALLS: Has patient fallen in last 6 months? Yes. Number of falls 1  LIVING ENVIRONMENT: Lives with: lives alone Lives in: House/apartment Stairs: No Has following equipment at home: Single point cane, Environmental consultant - 2 wheeled, Environmental consultant - 4 wheeled, shower chair, and Ramped entry  PLOF: Independent  PATIENT GOALS: Get back to dancing, get rid of dizziness  OBJECTIVE:   DIAGNOSTIC FINDINGS:   US Carotid Bilateral 11/19/2022: "RIGHT CAROTID ARTERY: No significant calcifications of the right common carotid artery. Intermediate waveform maintained. Heterogeneous and partially calcified plaque at the right carotid bifurcation. No significant lumen shadowing. Low resistance waveform of the right ICA. No significant tortuosity.   RIGHT VERTEBRAL ARTERY: Antegrade flow with low resistance waveform.   LEFT CAROTID ARTERY: No significant calcifications of the left common carotid artery. Intermediate waveform maintained. Heterogeneous and partially calcified plaque at the left  carotid bifurcation without significant lumen shadowing. Low resistance waveform of the left ICA. No significant tortuosity.   LEFT VERTEBRAL ARTERY:  Antegrade flow with low resistance waveform.   IMPRESSION: Color duplex indicates minimal heterogeneous and calcified plaque, with no hemodynamically significant stenosis by duplex criteria in the extracranial cerebrovascular circulation."  MR BRAIN WO Contrast 08/18/2022: "IMPRESSION: Normal MRI appearance of the brain for age. No acute or focal abnormality to explain the patient's symptoms."  CT HEAD WO CONTRAST 08/18/2022: "IMPRESSION: 1. Stable mild atrophy and white matter disease. This likely reflects the sequela of chronic microvascular ischemia. 2. No acute intracranial abnormality or significant interval change."    COGNITION: Overall cognitive status: Within functional limits for tasks assessed   SENSATION: Pt reports she has neuropathy in LE  EDEMA:  None observed    POSTURE:  increased thoracic kyphosis  Cervical ROM:    Pt observed to have limited cervical ext, reports this is a result from cervical fusion decades ago  STRENGTH:   LOWER EXTREMITY MMT:   Taken 01/06/23 MMT: strength grossly 4/5 in BLE  BED MOBILITY:  Impaired when pt has symptoms/dizziness, otherwise indep  TRANSFERS: Assistive device utilized: None  Sit to stand: Complete Independence Stand to sit:  Complete Independence   GAIT: Gait pattern: pt ambulated clinic distances without AD. She is unsteady with ambulating with head turns (horizontal>vertical), vertical head turns are somewhat limited due to hx of cervical fusions from decades ago.  FUNCTIONAL TESTS:  Dynamic Gait Index: 19/24  PATIENT SURVEYS:  ABC scale deferred FOTO 54  VESTIBULAR ASSESSMENT:  GENERAL OBSERVATION: reports FOF, uncertain of what brings on her symptoms, impacts ADLs generally when dizziness occurs   SYMPTOM BEHAVIOR:  Subjective history: Pt seen  in ED Sept 2023 due to dizziness, was given Meclizine. Imaging reassuring per report. Pt then sent to ENT, found to have hearing loss, pt with chronic B ear pain (worse on L), and sense of fullness in ears with occ ringing. Pt feels things are generally worse on her L side. She has had only 1 recent instance of spinning that lasted for minutes. Other symptoms have been a lightheadedness or swimmy-headedness. Pt can become nauseated and vomit.   Non-Vestibular symptoms: changes in hearing, headaches, tinnitus, nausea/vomiting, and loss of consciousness  Type of dizziness: Spinning/Vertigo, Lightheadedness/Faint, and "Swimmyheaded" and unsteadiness.  Frequency: pt unsure  Duration: spinning lasted for minutes, but pt can be nauseated and feel off for entire day  Aggravating factors:  pt unsure    OCULOMOTOR EXAM:  Ocular Alignment: normal  Ocular ROM: No Limitations  Spontaneous Nystagmus: absent  Gaze-Induced Nystagmus: absent  Smooth Pursuits:  majority of movement smooth, did note 3 saccadic movements with vertical gaze   Saccades: intact  Convergence/Divergence: Sees double at approx 6" from nose   VESTIBULAR - OCULAR REFLEX:   Slow VOR: Normal  Head-Impulse Test:   - Catch up saccade noted 1x with testing of R side  -WNL to L  Dynamic Visual Acuity: Impaired, change from line 8 to 5    POSITIONAL TESTING:   Roll testing: negative B Dix-Hallpike: negative B      OTHOSTATICS:  Taken 01/06/23 Orthostatics- Supine: 126/57 mmHg, HR 59 bpm Seated: 108/49 mmHg, HR 59 bpm Standing: 115/55 mmHg, HR 69 bpm She reports no dizziness/lightheadedness throughout orthostatic testing  FUNCTIONAL GAIT: Dynamic Gait Index: 19/24, greatest deficits with obstacle clearance, gait with head turns, change in gait speed   VESTIBULAR TREATMENT:                                                                                                   DATE:   NMR: At support surface Airex: NBOS: --EO  30 sec --EC 60 sec Slow marching 20x alt LE  Tandem stance 2x30 sec each LE SLB 2x30 sec each LE  Over 10 meters: Tandem gait 6x last two rounds with dual cog task Gait with EC 6x. Intermittent step strategy. PT provides up to min assist Gait with high knee march 4x. Difficult with SLB on RLE  TherEx:  Leg press: 25# 10x BLE 40# 8x BLE  STS 10x  Standing hip abduction 1x8, 1x15 each LE  Standing heel raise 2x15 each LE. Fatiguing   Nustep endurance training . PT provides cuing for technique, monitors pt for response throughout Lvl 1 x 1  min Lvl 2 x 1 min Lvl 3 x 1 min Lvl 2 x 1 min Lvl 1 x 1 min Somewhat fatigued with intervention today due to performing following therex above   PATIENT EDUCATION: Education details: Pt educated throughout session about proper posture and technique with exercises. Improved exercise technique, movement at target joints, use of target muscles after min to mod verbal, visual, tactile cues.  Person educated: Patient Education method: Explanation, Demonstration, Tactile cues, and Verbal cues Education comprehension: verbalized understanding and returned demonstration  HOME EXERCISE PROGRAM: Pt to continue HEP as previously given  01/27/23: Access Code: [email or web address] URL: [email or web address] Date: 01/27/2023 Prepared by: Ricard Dillon  Exercises - Shoulder External Rotation and Scapular Retraction with Resistance  - 1 x daily - 5 x weekly - 3 sets - 12 reps  1.29: Access Code: [email or web address] URL: [email or web address] Date: 01/06/2023 Prepared by: Ricard Dillon  Exercises - Sit to Stand with Counter Support  - 1 x daily - 5 x weekly - 3 sets - 10 reps Access Code: [email or web address] URL: [email or web address] Date: 12/31/2022 Prepared by: Ricard Dillon  Exercises - Seated Gaze Stabilization with Head Rotation  - 1 x daily - 7 x weekly - 3 sets - 2 reps - 30 seconds hold   GOALS: Goals reviewed with patient?  Yes  SHORT TERM GOALS: Target date: 03/31/2023   Patient will be independent in home exercise program to improve strength/mobility for better functional independence with ADLs. Baseline: initiated VOR HEP; 3/4: Pt independent HEP Goal status: MET   LONG TERM GOALS: Target date: 05/12/2023    Patient will increase FOTO score to equal to or greater than  64  to demonstrate  improvement in mobility and quality of life. Baseline: 54; 3/4; 49 Goal status: Ongoing   2.  Patient will increase ABC scale score >80% to demonstrate better functional mobility and better confidence with ADLs.  Baseline: to be assessed next 1-2 visits; 3/4: 75.63% Goal status: Partially met  4.  Patient will increase dynamic gait index score to >21/24 as to demonstrate reduced fall risk and improved dynamic gait balance for better safety with community/home ambulation.   Baseline: 19/24; 3/4: 21/24 Goal status: Partially MET     ASSESSMENT:  CLINICAL IMPRESSION: Pt continues to exhibit high motivation to participate in PT session. She advances to dual cog dynamic balance tasks, still has difficulty with EC requiring up to min assist. Pt also able to progress strengthening therex by using leg press today. She tolerated interventions well without pain and without excessive fatigue. The pt will benefit from further skilled PT to address these deficits in order to improve balance, dizziness and to decrease fall risk.    OBJECTIVE IMPAIRMENTS: Abnormal gait, decreased balance, decreased mobility, difficulty walking, decreased ROM, dizziness, hypomobility, impaired sensation, improper body mechanics, postural dysfunction, and pain.   ACTIVITY LIMITATIONS: lifting, bending, standing, stairs, bed mobility, and locomotion level All activities can become limited when pt dizzy  PARTICIPATION LIMITATIONS: meal prep, cleaning, laundry, shopping, community activity, and yard work  PERSONAL FACTORS: Age, Past/current  experiences, Sex, and 3+ comorbidities: PMH: arthritis, anxiety, migraines, HTN, depression, skin cancer, allergy, lumbar back pain, chronic venous insufficiency, prediabetes, vertigo, UTI, chest pain, abnormal chest x-ray, restless leg syndrome, lower extremity pain diffuse, generalized anxiety disorder, primary OA of both knees, lumbosacral radiculopathy due to intervertebral disc disorder, bradycardia, LBBB, cellulitis of RLE without foot, acute otitis media, she reports she had low back and neck surgery in her 20s due to degeneration of  a vertebra, she reports this procedure was a fusion.  are also affecting patient's functional outcome.   REHAB POTENTIAL: Good  CLINICAL DECISION MAKING: Evolving/moderate complexity  EVALUATION COMPLEXITY: Moderate   PLAN:  PT FREQUENCY: 2x/week  PT DURATION: 12 weeks  PLANNED INTERVENTIONS: Therapeutic exercises, Therapeutic activity, Neuromuscular re-education, Balance training, Gait training, Patient/Family education, Self Care, Joint mobilization, Stair training, Vestibular training, Canalith repositioning, Visual/preceptual remediation/compensation, Orthotic/Fit training, DME instructions, Wheelchair mobility training, Spinal mobilization, Cryotherapy, Moist heat, Taping, Ultrasound, Manual therapy, and Re-evaluation  PLAN FOR NEXT SESSION: cervical ROM, balance, VOR, strength   Zollie Pee, PT 02/17/2023, 11:58 AM

## 2023-02-20 ENCOUNTER — Ambulatory Visit: Payer: Medicare HMO

## 2023-02-24 ENCOUNTER — Ambulatory Visit: Payer: Medicare HMO

## 2023-02-27 ENCOUNTER — Ambulatory Visit: Payer: Medicare HMO

## 2023-03-03 DIAGNOSIS — M9903 Segmental and somatic dysfunction of lumbar region: Secondary | ICD-10-CM | POA: Diagnosis not present

## 2023-03-03 DIAGNOSIS — M5416 Radiculopathy, lumbar region: Secondary | ICD-10-CM | POA: Diagnosis not present

## 2023-03-03 DIAGNOSIS — M4306 Spondylolysis, lumbar region: Secondary | ICD-10-CM | POA: Diagnosis not present

## 2023-03-03 DIAGNOSIS — M9905 Segmental and somatic dysfunction of pelvic region: Secondary | ICD-10-CM | POA: Diagnosis not present

## 2023-03-04 DIAGNOSIS — M5416 Radiculopathy, lumbar region: Secondary | ICD-10-CM | POA: Diagnosis not present

## 2023-03-04 DIAGNOSIS — M9905 Segmental and somatic dysfunction of pelvic region: Secondary | ICD-10-CM | POA: Diagnosis not present

## 2023-03-04 DIAGNOSIS — M9903 Segmental and somatic dysfunction of lumbar region: Secondary | ICD-10-CM | POA: Diagnosis not present

## 2023-03-04 DIAGNOSIS — M4306 Spondylolysis, lumbar region: Secondary | ICD-10-CM | POA: Diagnosis not present

## 2023-03-05 DIAGNOSIS — M9905 Segmental and somatic dysfunction of pelvic region: Secondary | ICD-10-CM | POA: Diagnosis not present

## 2023-03-05 DIAGNOSIS — M4306 Spondylolysis, lumbar region: Secondary | ICD-10-CM | POA: Diagnosis not present

## 2023-03-05 DIAGNOSIS — M9903 Segmental and somatic dysfunction of lumbar region: Secondary | ICD-10-CM | POA: Diagnosis not present

## 2023-03-05 DIAGNOSIS — M5416 Radiculopathy, lumbar region: Secondary | ICD-10-CM | POA: Diagnosis not present

## 2023-03-06 ENCOUNTER — Ambulatory Visit: Payer: Medicare HMO

## 2023-03-07 DIAGNOSIS — M545 Low back pain, unspecified: Secondary | ICD-10-CM | POA: Diagnosis not present

## 2023-03-10 ENCOUNTER — Ambulatory Visit: Payer: Medicare HMO

## 2023-03-13 ENCOUNTER — Ambulatory Visit: Payer: Medicare HMO

## 2023-03-21 DIAGNOSIS — M545 Low back pain, unspecified: Secondary | ICD-10-CM | POA: Diagnosis not present

## 2023-03-24 ENCOUNTER — Ambulatory Visit: Payer: Medicare HMO | Attending: Otolaryngology

## 2023-03-25 DIAGNOSIS — H903 Sensorineural hearing loss, bilateral: Secondary | ICD-10-CM | POA: Diagnosis not present

## 2023-03-25 DIAGNOSIS — R42 Dizziness and giddiness: Secondary | ICD-10-CM | POA: Diagnosis not present

## 2023-03-28 DIAGNOSIS — M545 Low back pain, unspecified: Secondary | ICD-10-CM | POA: Diagnosis not present

## 2023-03-31 ENCOUNTER — Ambulatory Visit: Payer: Medicare HMO

## 2023-03-31 DIAGNOSIS — M545 Low back pain, unspecified: Secondary | ICD-10-CM | POA: Diagnosis not present

## 2023-03-31 DIAGNOSIS — M5416 Radiculopathy, lumbar region: Secondary | ICD-10-CM | POA: Diagnosis not present

## 2023-04-02 DIAGNOSIS — M5412 Radiculopathy, cervical region: Secondary | ICD-10-CM | POA: Diagnosis not present

## 2023-04-03 ENCOUNTER — Ambulatory Visit: Payer: Medicare HMO

## 2023-04-07 ENCOUNTER — Ambulatory Visit: Payer: Medicare HMO

## 2023-04-10 ENCOUNTER — Ambulatory Visit: Payer: Medicare HMO

## 2023-04-14 ENCOUNTER — Ambulatory Visit: Payer: Medicare HMO

## 2023-04-17 ENCOUNTER — Ambulatory Visit: Payer: Medicare HMO

## 2023-04-21 ENCOUNTER — Ambulatory Visit: Payer: Medicare HMO

## 2023-04-21 DIAGNOSIS — I10 Essential (primary) hypertension: Secondary | ICD-10-CM | POA: Diagnosis not present

## 2023-04-21 DIAGNOSIS — I872 Venous insufficiency (chronic) (peripheral): Secondary | ICD-10-CM | POA: Diagnosis not present

## 2023-04-21 DIAGNOSIS — M545 Low back pain, unspecified: Secondary | ICD-10-CM | POA: Diagnosis not present

## 2023-04-21 DIAGNOSIS — G629 Polyneuropathy, unspecified: Secondary | ICD-10-CM | POA: Diagnosis not present

## 2023-04-21 DIAGNOSIS — G2581 Restless legs syndrome: Secondary | ICD-10-CM | POA: Diagnosis not present

## 2023-04-24 ENCOUNTER — Ambulatory Visit: Payer: Medicare HMO

## 2023-04-25 DIAGNOSIS — M5416 Radiculopathy, lumbar region: Secondary | ICD-10-CM | POA: Diagnosis not present

## 2023-04-28 ENCOUNTER — Ambulatory Visit: Payer: Medicare HMO

## 2023-05-01 ENCOUNTER — Ambulatory Visit: Payer: Medicare HMO

## 2023-05-08 ENCOUNTER — Ambulatory Visit: Payer: Medicare HMO

## 2023-05-12 ENCOUNTER — Ambulatory Visit: Payer: Medicare HMO

## 2023-05-15 ENCOUNTER — Ambulatory Visit: Payer: Medicare HMO

## 2023-05-15 DIAGNOSIS — M545 Low back pain, unspecified: Secondary | ICD-10-CM | POA: Diagnosis not present

## 2023-05-15 DIAGNOSIS — M5136 Other intervertebral disc degeneration, lumbar region: Secondary | ICD-10-CM | POA: Diagnosis not present

## 2023-05-19 ENCOUNTER — Ambulatory Visit: Payer: Medicare HMO

## 2023-05-19 DIAGNOSIS — L989 Disorder of the skin and subcutaneous tissue, unspecified: Secondary | ICD-10-CM | POA: Diagnosis not present

## 2023-05-19 DIAGNOSIS — S32010D Wedge compression fracture of first lumbar vertebra, subsequent encounter for fracture with routine healing: Secondary | ICD-10-CM | POA: Diagnosis not present

## 2023-05-21 DIAGNOSIS — Z85828 Personal history of other malignant neoplasm of skin: Secondary | ICD-10-CM | POA: Diagnosis not present

## 2023-05-21 DIAGNOSIS — L570 Actinic keratosis: Secondary | ICD-10-CM | POA: Diagnosis not present

## 2023-05-21 DIAGNOSIS — L578 Other skin changes due to chronic exposure to nonionizing radiation: Secondary | ICD-10-CM | POA: Diagnosis not present

## 2023-05-21 DIAGNOSIS — X32XXXA Exposure to sunlight, initial encounter: Secondary | ICD-10-CM | POA: Diagnosis not present

## 2023-05-21 DIAGNOSIS — Z08 Encounter for follow-up examination after completed treatment for malignant neoplasm: Secondary | ICD-10-CM | POA: Diagnosis not present

## 2023-05-22 ENCOUNTER — Ambulatory Visit: Payer: Medicare HMO

## 2023-05-26 ENCOUNTER — Ambulatory Visit: Payer: Medicare HMO

## 2023-05-27 DIAGNOSIS — M4856XA Collapsed vertebra, not elsewhere classified, lumbar region, initial encounter for fracture: Secondary | ICD-10-CM | POA: Diagnosis not present

## 2023-05-27 DIAGNOSIS — M5136 Other intervertebral disc degeneration, lumbar region: Secondary | ICD-10-CM | POA: Diagnosis not present

## 2023-05-27 DIAGNOSIS — M5416 Radiculopathy, lumbar region: Secondary | ICD-10-CM | POA: Diagnosis not present

## 2023-05-29 ENCOUNTER — Ambulatory Visit: Payer: Medicare HMO

## 2023-06-01 DIAGNOSIS — M4317 Spondylolisthesis, lumbosacral region: Secondary | ICD-10-CM | POA: Diagnosis not present

## 2023-06-01 DIAGNOSIS — R0600 Dyspnea, unspecified: Secondary | ICD-10-CM | POA: Diagnosis not present

## 2023-06-01 DIAGNOSIS — S32010A Wedge compression fracture of first lumbar vertebra, initial encounter for closed fracture: Secondary | ICD-10-CM | POA: Diagnosis not present

## 2023-06-01 DIAGNOSIS — G629 Polyneuropathy, unspecified: Secondary | ICD-10-CM | POA: Diagnosis not present

## 2023-06-01 DIAGNOSIS — J432 Centrilobular emphysema: Secondary | ICD-10-CM | POA: Diagnosis not present

## 2023-06-01 DIAGNOSIS — M5417 Radiculopathy, lumbosacral region: Secondary | ICD-10-CM | POA: Diagnosis not present

## 2023-06-01 DIAGNOSIS — R918 Other nonspecific abnormal finding of lung field: Secondary | ICD-10-CM | POA: Diagnosis not present

## 2023-06-01 DIAGNOSIS — R0789 Other chest pain: Secondary | ICD-10-CM | POA: Diagnosis not present

## 2023-06-01 DIAGNOSIS — Z87891 Personal history of nicotine dependence: Secondary | ICD-10-CM | POA: Diagnosis not present

## 2023-06-01 DIAGNOSIS — M5116 Intervertebral disc disorders with radiculopathy, lumbar region: Secondary | ICD-10-CM | POA: Diagnosis not present

## 2023-06-01 DIAGNOSIS — N3281 Overactive bladder: Secondary | ICD-10-CM | POA: Diagnosis not present

## 2023-06-01 DIAGNOSIS — R0602 Shortness of breath: Secondary | ICD-10-CM | POA: Diagnosis not present

## 2023-06-01 DIAGNOSIS — M5117 Intervertebral disc disorders with radiculopathy, lumbosacral region: Secondary | ICD-10-CM | POA: Diagnosis not present

## 2023-06-01 DIAGNOSIS — G8929 Other chronic pain: Secondary | ICD-10-CM | POA: Diagnosis not present

## 2023-06-01 DIAGNOSIS — R531 Weakness: Secondary | ICD-10-CM | POA: Diagnosis not present

## 2023-06-01 DIAGNOSIS — I447 Left bundle-branch block, unspecified: Secondary | ICD-10-CM | POA: Diagnosis not present

## 2023-06-02 ENCOUNTER — Ambulatory Visit: Payer: Medicare HMO

## 2023-06-02 DIAGNOSIS — R0609 Other forms of dyspnea: Secondary | ICD-10-CM | POA: Diagnosis not present

## 2023-06-02 DIAGNOSIS — R918 Other nonspecific abnormal finding of lung field: Secondary | ICD-10-CM | POA: Diagnosis not present

## 2023-06-02 DIAGNOSIS — M5416 Radiculopathy, lumbar region: Secondary | ICD-10-CM | POA: Diagnosis not present

## 2023-06-02 DIAGNOSIS — M519 Unspecified thoracic, thoracolumbar and lumbosacral intervertebral disc disorder: Secondary | ICD-10-CM | POA: Diagnosis not present

## 2023-06-02 DIAGNOSIS — R0789 Other chest pain: Secondary | ICD-10-CM | POA: Diagnosis not present

## 2023-06-03 DIAGNOSIS — S81811A Laceration without foreign body, right lower leg, initial encounter: Secondary | ICD-10-CM | POA: Diagnosis not present

## 2023-06-05 ENCOUNTER — Ambulatory Visit: Payer: Medicare HMO

## 2023-06-05 DIAGNOSIS — M5126 Other intervertebral disc displacement, lumbar region: Secondary | ICD-10-CM | POA: Diagnosis not present

## 2023-06-05 DIAGNOSIS — D485 Neoplasm of uncertain behavior of skin: Secondary | ICD-10-CM | POA: Diagnosis not present

## 2023-06-05 DIAGNOSIS — L570 Actinic keratosis: Secondary | ICD-10-CM | POA: Diagnosis not present

## 2023-06-05 DIAGNOSIS — M5416 Radiculopathy, lumbar region: Secondary | ICD-10-CM | POA: Diagnosis not present

## 2023-06-09 ENCOUNTER — Ambulatory Visit: Payer: Medicare HMO

## 2023-06-10 DIAGNOSIS — R918 Other nonspecific abnormal finding of lung field: Secondary | ICD-10-CM | POA: Diagnosis not present

## 2023-06-15 ENCOUNTER — Emergency Department: Payer: Medicare HMO

## 2023-06-15 ENCOUNTER — Other Ambulatory Visit: Payer: Self-pay

## 2023-06-15 ENCOUNTER — Emergency Department
Admission: EM | Admit: 2023-06-15 | Discharge: 2023-06-15 | Disposition: A | Discharge status: Home / Self Care | Payer: Medicare HMO | Attending: Emergency Medicine | Admitting: Emergency Medicine

## 2023-06-15 DIAGNOSIS — R531 Weakness: Secondary | ICD-10-CM | POA: Diagnosis not present

## 2023-06-15 DIAGNOSIS — S81812A Laceration without foreign body, left lower leg, initial encounter: Secondary | ICD-10-CM | POA: Diagnosis not present

## 2023-06-15 DIAGNOSIS — X58XXXA Exposure to other specified factors, initial encounter: Secondary | ICD-10-CM | POA: Insufficient documentation

## 2023-06-15 DIAGNOSIS — L03116 Cellulitis of left lower limb: Secondary | ICD-10-CM | POA: Diagnosis not present

## 2023-06-15 DIAGNOSIS — Z85118 Personal history of other malignant neoplasm of bronchus and lung: Secondary | ICD-10-CM | POA: Insufficient documentation

## 2023-06-15 DIAGNOSIS — S8992XA Unspecified injury of left lower leg, initial encounter: Secondary | ICD-10-CM | POA: Diagnosis present

## 2023-06-15 HISTORY — DX: Polyneuropathy, unspecified: G62.9

## 2023-06-15 LAB — CBC WITH DIFFERENTIAL/PLATELET
Abs Immature Granulocytes: 0.03 10*3/uL (ref 0.00–0.07)
Basophils Absolute: 0.1 10*3/uL (ref 0.0–0.1)
Basophils Relative: 1 %
Eosinophils Absolute: 0.1 10*3/uL (ref 0.0–0.5)
Eosinophils Relative: 1 %
HCT: 38.4 % (ref 36.0–46.0)
Hemoglobin: 12.4 g/dL (ref 12.0–15.0)
Immature Granulocytes: 0 %
Lymphocytes Relative: 16 %
Lymphs Abs: 1.7 10*3/uL (ref 0.7–4.0)
MCH: 31 pg (ref 26.0–34.0)
MCHC: 32.3 g/dL (ref 30.0–36.0)
MCV: 96 fL (ref 80.0–100.0)
Monocytes Absolute: 1.1 10*3/uL — ABNORMAL HIGH (ref 0.1–1.0)
Monocytes Relative: 11 %
Neutro Abs: 7.4 10*3/uL (ref 1.7–7.7)
Neutrophils Relative %: 71 %
Platelets: 204 10*3/uL (ref 150–400)
RBC: 4 MIL/uL (ref 3.87–5.11)
RDW: 12.4 % (ref 11.5–15.5)
WBC: 10.4 10*3/uL (ref 4.0–10.5)
nRBC: 0 % (ref 0.0–0.2)

## 2023-06-15 LAB — COMPREHENSIVE METABOLIC PANEL
ALT: 13 U/L (ref 0–44)
AST: 20 U/L (ref 15–41)
Albumin: 4.1 g/dL (ref 3.5–5.0)
Alkaline Phosphatase: 61 U/L (ref 38–126)
Anion gap: 11 (ref 5–15)
BUN: 18 mg/dL (ref 8–23)
CO2: 23 mmol/L (ref 22–32)
Calcium: 9 mg/dL (ref 8.9–10.3)
Chloride: 100 mmol/L (ref 98–111)
Creatinine, Ser: 0.92 mg/dL (ref 0.44–1.00)
GFR, Estimated: >60 mL/min (ref >60)
Glucose, Bld: 123 mg/dL — ABNORMAL HIGH (ref 70–99)
Potassium: 3.6 mmol/L (ref 3.5–5.1)
Sodium: 134 mmol/L — ABNORMAL LOW (ref 135–145)
Total Bilirubin: 0.6 mg/dL (ref 0.3–1.2)
Total Protein: 7.5 g/dL (ref 6.5–8.1)

## 2023-06-15 LAB — URINALYSIS, ROUTINE W REFLEX MICROSCOPIC
Bilirubin Urine: NEGATIVE
Glucose, UA: NEGATIVE mg/dL
Hgb urine dipstick: NEGATIVE
Ketones, ur: NEGATIVE mg/dL
Nitrite: NEGATIVE
Protein, ur: NEGATIVE mg/dL
Specific Gravity, Urine: 1.003 — ABNORMAL LOW (ref 1.005–1.030)
pH: 5 (ref 5.0–8.0)

## 2023-06-15 LAB — LACTIC ACID, PLASMA: Lactic Acid, Venous: 1.3 mmol/L (ref 0.5–1.9)

## 2023-06-15 MED ORDER — SODIUM CHLORIDE 0.9 % IV SOLN: 2.0000 g | Freq: Once | INTRAVENOUS | Status: DC

## 2023-06-15 MED ORDER — SODIUM CHLORIDE 0.9 % IV SOLN
1.0000 g | Freq: Once | INTRAVENOUS | Status: AC
  Administered 2023-06-15: 1 g via INTRAVENOUS
  Filled 2023-06-15: qty 10

## 2023-06-15 MED ORDER — SODIUM CHLORIDE 0.9 % IV BOLUS (SEPSIS)
1000.0000 mL | Freq: Once | INTRAVENOUS | Status: AC
  Administered 2023-06-15: 1000 mL via INTRAVENOUS

## 2023-06-15 MED ORDER — LACTOBACILLUS PROBIOTIC PO TABS: 1.0000 | ORAL_TABLET | Freq: Every morning | ORAL | 0 refills | Status: AC

## 2023-06-15 MED ORDER — VANCOMYCIN HCL IN DEXTROSE 1-5 GM/200ML-% IV SOLN
1000.0000 mg | Freq: Once | INTRAVENOUS | Status: AC
  Administered 2023-06-15: 1000 mg via INTRAVENOUS
  Filled 2023-06-15: qty 200

## 2023-06-15 MED ORDER — SODIUM CHLORIDE 0.9 % IV BOLUS (SEPSIS)
500.0000 mL | Freq: Once | INTRAVENOUS | Status: AC
  Administered 2023-06-15: 500 mL via INTRAVENOUS

## 2023-06-15 MED ORDER — CLINDAMYCIN HCL 300 MG PO CAPS: 300.0000 mg | ORAL_CAPSULE | Freq: Three times a day (TID) | ORAL | 0 refills | Status: AC

## 2023-06-15 MED ORDER — SODIUM CHLORIDE 0.9 % IV BOLUS (SEPSIS)
250.0000 mL | Freq: Once | INTRAVENOUS | Status: AC
  Administered 2023-06-15: 250 mL via INTRAVENOUS

## 2023-06-15 MED ORDER — SODIUM CHLORIDE 0.9 % IV BOLUS
1000.0000 mL | Freq: Once | INTRAVENOUS | Status: AC
  Administered 2023-06-15: 1000 mL via INTRAVENOUS

## 2023-06-15 NOTE — Progress Notes (Signed)
CODE SEPSIS - PHARMACY COMMUNICATION  **Broad Spectrum Antibiotics should be administered within 1 hour of Sepsis diagnosis**  Time Code Sepsis Called/Page Received: 1600  Antibiotics Ordered: vancomycin and ceftriaxone  Time of 1st antibiotic administration: 1630  Additional action taken by pharmacy: none  If necessary, Name of Provider/Nurse Contacted: n/a    Elliot Gurney, PharmD, BCPS Clinical Pharmacist  06/15/2023 4:04 PM

## 2023-06-15 NOTE — Progress Notes (Signed)
PHARMACY -  BRIEF ANTIBIOTIC NOTE   Pharmacy has received consults for vancomycin and aztreonam (pending allergy assessment) from an ED provider.  The patient's profile has been reviewed for ht/wt/allergies/indication/available labs.    One time orders placed for vancomycin 1,000 mg and ceftriaxone 1 gram x 1  Allergy to penicillin documented in 2017. Patient received cephalexin in 2019. Per patient reaction was "nausea and fainting". Given more recent cephalexin dose and lack of anaphylaxis, will proceed with cephalosporin.  Further antibiotics/pharmacy consults should be ordered by admitting physician if indicated.                       Thank you,  Elliot Gurney, PharmD, BCPS Clinical Pharmacist  06/15/2023 4:03 PM

## 2023-06-15 NOTE — ED Provider Notes (Signed)
[facility] Provider Note   Event Date/Time   First MD Initiated Contact with Patient 06/15/23 1533     (approximate) History  Wound Check and Weakness  HPI Sandra Davidson is a 87 y.o. female recently diagnosed with lung cancer who presents complaining of generalized weakness as well as a skin tear to the posterior left calf that she is concerned may be infected.  Patient states that she has had worsening redness and pain around this last calf since she stepped out of a truck and scraped it on the side.  Patient states that she has been cleaning this wound as well as putting on antibiotic ointment but is worried that the wound is continuing to worsen. ROS: Patient currently denies any vision changes, tinnitus, difficulty speaking, facial droop, sore throat, chest pain, shortness of breath, abdominal pain, nausea/vomiting/diarrhea, dysuria, or weakness/numbness/paresthesias in any extremity   Physical Exam  Triage Vital Signs: ED Triage Vitals  Enc Vitals Group     BP 06/15/23 1349 117/68     Pulse Rate 06/15/23 1349 83     Resp 06/15/23 1349 20     Temp 06/15/23 1355 97.7 F (36.5 C)     Temp Source 06/15/23 1349 Oral     SpO2 06/15/23 1349 100 %     Weight 06/15/23 1350 115 lb (52.2 kg)     Height 06/15/23 1350 5\' 7"  (1.702 m)     Head Circumference --      Peak Flow --      Pain Score 06/15/23 1350 8     Pain Loc --      Pain Edu? --      Excl. in GC? --    Most recent vital signs: Vitals:   06/15/23 1841 06/15/23 1841  BP: (!) 145/67   Pulse: 86   Resp: 20   Temp:  97.6 F (36.4 C)  SpO2: 98%    General: Awake, oriented x4. CV:  Good peripheral perfusion.  Resp:  Normal effort.  Abd:  No distention.  Other:  Elderly cachectic Caucasian female laying in bed in no acute distress.  There is a 5 cm x 4 cm skin tear to the posterior left calf with surrounding erythema and scant purulent drainage ED Results / Procedures / Treatments  Labs (all  labs ordered are listed, but only abnormal results are displayed) Labs Reviewed  COMPREHENSIVE METABOLIC PANEL - Abnormal; Notable for the following components:      Result Value   Sodium 134 (*)    Glucose, Bld 123 (*)    All other components within normal limits  CBC WITH DIFFERENTIAL/PLATELET - Abnormal; Notable for the following components:   Monocytes Absolute 1.1 (*)    All other components within normal limits  URINALYSIS, ROUTINE W REFLEX MICROSCOPIC - Abnormal; Notable for the following components:   Color, Urine STRAW (*)    APPearance HAZY (*)    Specific Gravity, Urine 1.003 (*)    Leukocytes,Ua LARGE (*)    Bacteria, UA RARE (*)    All other components within normal limits  CULTURE, BLOOD (ROUTINE X 2)  CULTURE, BLOOD (ROUTINE X 2)  LACTIC ACID, PLASMA  RADIOLOGY ED MD interpretation: 2 view chest x-ray interpreted independently by me and shows no acute cardiopulmonary findings.  The soft tissue fullness in the right hilar region without gross pulmonary mass lesion evident in this patient with a reported history of newly diagnosed lung cancer.  There is new superior  endplate compression deformity of a vertebral body in the region of the thoracicolumbar junction -Agree with radiology assessment Official radiology report(s): DG Chest 2 View  Result Date: 06/15/2023 CLINICAL DATA:  Generalized weakness. Reportedly diagnosed with lung cancer last week. EXAM: CHEST - 2 VIEW COMPARISON:  01/07/2018 FINDINGS: The lungs are clear without focal pneumonia, edema, pneumothorax or pleural effusion. Soft tissue fullness noted in the right hilar region but no gross pulmonary mass lesion evident. The cardiopericardial silhouette is within normal limits for size. Bones are diffusely demineralized. Superior endplate compression deformity of a vertebral body in the region of the thoracolumbar junction is new in the interval. IMPRESSION: 1. No acute cardiopulmonary findings. 2. Soft tissue  fullness in the right hilar region without gross pulmonary mass lesion evident in this patient with a reported history of newly diagnosed lung cancer. 3. New superior endplate compression deformity of a vertebral body in the region of the thoracolumbar junction. Electronically Signed   By: Kennith Center M.D.   On: 06/15/2023 14:28   PROCEDURES: Critical Care performed: No Procedures MEDICATIONS ORDERED IN ED: Medications  sodium chloride 0.9 % bolus 1,000 mL (0 mLs Intravenous Stopped 06/15/23 1910)  vancomycin (VANCOCIN) IVPB 1000 mg/200 mL premix (0 mg Intravenous Stopped 06/15/23 1728)  sodium chloride 0.9 % bolus 1,000 mL (0 mLs Intravenous Stopped 06/15/23 1658)    And  sodium chloride 0.9 % bolus 500 mL (0 mLs Intravenous Stopped 06/15/23 1812)    And  sodium chloride 0.9 % bolus 250 mL (0 mLs Intravenous Stopped 06/15/23 1812)  cefTRIAXone (ROCEPHIN) 1 g in sodium chloride 0.9 % 100 mL IVPB (0 g Intravenous Stopped 06/15/23 1658)   IMPRESSION / MDM / ASSESSMENT AND PLAN / ED COURSE  I reviewed the triage vital signs and the nursing notes.                             The patient is on the cardiac monitor to evaluate for evidence of arrhythmia and/or significant heart rate changes. Patient's presentation is most consistent with acute presentation with potential threat to life or bodily function. Presentation most consistent with simple cellulitis. Given History, Exam, and Workup I have low suspicion for Necrotizing Fasciitis, Abscess, Osteomyelitis, DVT or other emergent problem as a cause for this presentation.  Rx: Clindamycin 3 mg 3 times daily x 10 days  Disposition: Discharge. No evidence of serious bacterial illness. Nontoxic appearing, VSS. Low risk for treatment failure based on history. Strict return precautions discussed with patient with full understanding. Advised patient to follow up promptly with primary care provider within next 48 hours.   FINAL CLINICAL IMPRESSION(S) / ED  DIAGNOSES   Final diagnoses:  Left leg cellulitis  Generalized weakness   Rx / DC Orders   ED Discharge Orders          Ordered    clindamycin (CLEOCIN) 300 MG capsule  3 times daily        06/15/23 1851    Lactobacillus Probiotic TABS  Every morning        06/15/23 1851           Note:  This document was prepared using Dragon voice recognition software and may include unintentional dictation errors.   Merwyn Katos, MD 06/15/23 [id number]

## 2023-06-15 NOTE — ED Notes (Signed)
Pts daughter called. Pt ride otw.

## 2023-06-15 NOTE — ED Triage Notes (Signed)
Pt to ED POV with daughter for several issues. Generalized weakness since 2 weeks ago. Last week diagnosed with lung cancer (no chemo yet) PET scan scheduled for 7/19 at [facility]. Skin tear to LLE 6 days ago which daughter concerned could be infected. Area appears slightly red but not obviously infected. Also daughter is looking for possible placement for pt. Pt lives alone. No recent falls. Hard to manage everything at home. Pt currently alert, oriented. Daughter states she has had intermittent episodes of disorientation/confusion.

## 2023-06-16 ENCOUNTER — Ambulatory Visit: Payer: Medicare HMO

## 2023-06-16 DIAGNOSIS — S51812A Laceration without foreign body of left forearm, initial encounter: Secondary | ICD-10-CM | POA: Diagnosis not present

## 2023-06-16 DIAGNOSIS — M8580 Other specified disorders of bone density and structure, unspecified site: Secondary | ICD-10-CM | POA: Diagnosis not present

## 2023-06-16 DIAGNOSIS — Z885 Allergy status to narcotic agent status: Secondary | ICD-10-CM | POA: Diagnosis not present

## 2023-06-16 DIAGNOSIS — I10 Essential (primary) hypertension: Secondary | ICD-10-CM | POA: Diagnosis not present

## 2023-06-16 DIAGNOSIS — S99912A Unspecified injury of left ankle, initial encounter: Secondary | ICD-10-CM | POA: Diagnosis not present

## 2023-06-16 DIAGNOSIS — Z881 Allergy status to other antibiotic agents status: Secondary | ICD-10-CM | POA: Diagnosis not present

## 2023-06-16 DIAGNOSIS — M11262 Other chondrocalcinosis, left knee: Secondary | ICD-10-CM | POA: Diagnosis not present

## 2023-06-16 DIAGNOSIS — S8992XA Unspecified injury of left lower leg, initial encounter: Secondary | ICD-10-CM | POA: Diagnosis not present

## 2023-06-16 DIAGNOSIS — C3490 Malignant neoplasm of unspecified part of unspecified bronchus or lung: Secondary | ICD-10-CM | POA: Diagnosis not present

## 2023-06-16 DIAGNOSIS — W19XXXA Unspecified fall, initial encounter: Secondary | ICD-10-CM | POA: Diagnosis not present

## 2023-06-16 DIAGNOSIS — L03116 Cellulitis of left lower limb: Secondary | ICD-10-CM | POA: Diagnosis not present

## 2023-06-16 DIAGNOSIS — R6889 Other general symptoms and signs: Secondary | ICD-10-CM | POA: Diagnosis not present

## 2023-06-16 DIAGNOSIS — Z9181 History of falling: Secondary | ICD-10-CM | POA: Diagnosis not present

## 2023-06-16 DIAGNOSIS — G629 Polyneuropathy, unspecified: Secondary | ICD-10-CM | POA: Diagnosis not present

## 2023-06-16 DIAGNOSIS — M549 Dorsalgia, unspecified: Secondary | ICD-10-CM | POA: Diagnosis not present

## 2023-06-16 DIAGNOSIS — S99922A Unspecified injury of left foot, initial encounter: Secondary | ICD-10-CM | POA: Diagnosis not present

## 2023-06-19 ENCOUNTER — Ambulatory Visit: Payer: Medicare HMO

## 2023-06-19 DIAGNOSIS — F419 Anxiety disorder, unspecified: Secondary | ICD-10-CM | POA: Diagnosis not present

## 2023-06-19 DIAGNOSIS — M8580 Other specified disorders of bone density and structure, unspecified site: Secondary | ICD-10-CM | POA: Diagnosis not present

## 2023-06-19 DIAGNOSIS — C3490 Malignant neoplasm of unspecified part of unspecified bronchus or lung: Secondary | ICD-10-CM | POA: Diagnosis not present

## 2023-06-19 DIAGNOSIS — S8992XA Unspecified injury of left lower leg, initial encounter: Secondary | ICD-10-CM | POA: Diagnosis not present

## 2023-06-19 DIAGNOSIS — S81812S Laceration without foreign body, left lower leg, sequela: Secondary | ICD-10-CM | POA: Diagnosis not present

## 2023-06-19 DIAGNOSIS — E440 Moderate protein-calorie malnutrition: Secondary | ICD-10-CM | POA: Diagnosis not present

## 2023-06-19 DIAGNOSIS — S51812D Laceration without foreign body of left forearm, subsequent encounter: Secondary | ICD-10-CM | POA: Diagnosis not present

## 2023-06-19 DIAGNOSIS — S99912A Unspecified injury of left ankle, initial encounter: Secondary | ICD-10-CM | POA: Diagnosis not present

## 2023-06-19 DIAGNOSIS — Z881 Allergy status to other antibiotic agents status: Secondary | ICD-10-CM | POA: Diagnosis not present

## 2023-06-19 DIAGNOSIS — G629 Polyneuropathy, unspecified: Secondary | ICD-10-CM | POA: Diagnosis not present

## 2023-06-19 DIAGNOSIS — C3431 Malignant neoplasm of lower lobe, right bronchus or lung: Secondary | ICD-10-CM | POA: Diagnosis not present

## 2023-06-19 DIAGNOSIS — R5381 Other malaise: Secondary | ICD-10-CM | POA: Diagnosis not present

## 2023-06-19 DIAGNOSIS — L0390 Cellulitis, unspecified: Secondary | ICD-10-CM | POA: Diagnosis not present

## 2023-06-19 DIAGNOSIS — R221 Localized swelling, mass and lump, neck: Secondary | ICD-10-CM | POA: Diagnosis not present

## 2023-06-19 DIAGNOSIS — S51812A Laceration without foreign body of left forearm, initial encounter: Secondary | ICD-10-CM | POA: Diagnosis not present

## 2023-06-19 DIAGNOSIS — R531 Weakness: Secondary | ICD-10-CM | POA: Diagnosis not present

## 2023-06-19 DIAGNOSIS — M6281 Muscle weakness (generalized): Secondary | ICD-10-CM | POA: Diagnosis not present

## 2023-06-19 DIAGNOSIS — M1990 Unspecified osteoarthritis, unspecified site: Secondary | ICD-10-CM | POA: Diagnosis not present

## 2023-06-19 DIAGNOSIS — G939 Disorder of brain, unspecified: Secondary | ICD-10-CM | POA: Diagnosis not present

## 2023-06-19 DIAGNOSIS — S41102D Unspecified open wound of left upper arm, subsequent encounter: Secondary | ICD-10-CM | POA: Diagnosis not present

## 2023-06-19 DIAGNOSIS — Z885 Allergy status to narcotic agent status: Secondary | ICD-10-CM | POA: Diagnosis not present

## 2023-06-19 DIAGNOSIS — Z79899 Other long term (current) drug therapy: Secondary | ICD-10-CM | POA: Diagnosis not present

## 2023-06-19 DIAGNOSIS — M542 Cervicalgia: Secondary | ICD-10-CM | POA: Diagnosis not present

## 2023-06-19 DIAGNOSIS — M11262 Other chondrocalcinosis, left knee: Secondary | ICD-10-CM | POA: Diagnosis not present

## 2023-06-19 DIAGNOSIS — Z9181 History of falling: Secondary | ICD-10-CM | POA: Diagnosis not present

## 2023-06-19 DIAGNOSIS — L03116 Cellulitis of left lower limb: Secondary | ICD-10-CM | POA: Diagnosis not present

## 2023-06-19 DIAGNOSIS — I10 Essential (primary) hypertension: Secondary | ICD-10-CM | POA: Diagnosis not present

## 2023-06-19 DIAGNOSIS — R519 Headache, unspecified: Secondary | ICD-10-CM | POA: Diagnosis not present

## 2023-06-19 DIAGNOSIS — G936 Cerebral edema: Secondary | ICD-10-CM | POA: Diagnosis not present

## 2023-06-19 DIAGNOSIS — Z1152 Encounter for screening for COVID-19: Secondary | ICD-10-CM | POA: Diagnosis not present

## 2023-06-19 DIAGNOSIS — R918 Other nonspecific abnormal finding of lung field: Secondary | ICD-10-CM | POA: Diagnosis not present

## 2023-06-19 DIAGNOSIS — S99922A Unspecified injury of left foot, initial encounter: Secondary | ICD-10-CM | POA: Diagnosis not present

## 2023-06-19 DIAGNOSIS — R279 Unspecified lack of coordination: Secondary | ICD-10-CM | POA: Diagnosis not present

## 2023-06-19 DIAGNOSIS — C7931 Secondary malignant neoplasm of brain: Secondary | ICD-10-CM | POA: Diagnosis not present

## 2023-06-19 DIAGNOSIS — G9389 Other specified disorders of brain: Secondary | ICD-10-CM | POA: Diagnosis not present

## 2023-06-19 DIAGNOSIS — G2581 Restless legs syndrome: Secondary | ICD-10-CM | POA: Diagnosis not present

## 2023-06-19 DIAGNOSIS — M549 Dorsalgia, unspecified: Secondary | ICD-10-CM | POA: Diagnosis not present

## 2023-06-19 DIAGNOSIS — R1319 Other dysphagia: Secondary | ICD-10-CM | POA: Diagnosis not present

## 2023-06-19 DIAGNOSIS — R54 Age-related physical debility: Secondary | ICD-10-CM | POA: Diagnosis not present

## 2023-06-20 DIAGNOSIS — R221 Localized swelling, mass and lump, neck: Secondary | ICD-10-CM | POA: Diagnosis not present

## 2023-06-20 DIAGNOSIS — S81812S Laceration without foreign body, left lower leg, sequela: Secondary | ICD-10-CM | POA: Diagnosis not present

## 2023-06-20 DIAGNOSIS — S51812D Laceration without foreign body of left forearm, subsequent encounter: Secondary | ICD-10-CM | POA: Diagnosis not present

## 2023-06-20 DIAGNOSIS — R1319 Other dysphagia: Secondary | ICD-10-CM | POA: Diagnosis not present

## 2023-06-20 LAB — CULTURE, BLOOD (ROUTINE X 2)
Culture: NO GROWTH
Culture: NO GROWTH
Special Requests: ADEQUATE
Special Requests: ADEQUATE

## 2023-06-23 ENCOUNTER — Ambulatory Visit: Payer: Medicare HMO

## 2023-06-23 DIAGNOSIS — S51812D Laceration without foreign body of left forearm, subsequent encounter: Secondary | ICD-10-CM | POA: Diagnosis not present

## 2023-06-23 DIAGNOSIS — R1319 Other dysphagia: Secondary | ICD-10-CM | POA: Diagnosis not present

## 2023-06-23 DIAGNOSIS — I10 Essential (primary) hypertension: Secondary | ICD-10-CM | POA: Diagnosis not present

## 2023-06-23 DIAGNOSIS — S81812S Laceration without foreign body, left lower leg, sequela: Secondary | ICD-10-CM | POA: Diagnosis not present

## 2023-06-24 DIAGNOSIS — R1319 Other dysphagia: Secondary | ICD-10-CM | POA: Diagnosis not present

## 2023-06-24 DIAGNOSIS — E440 Moderate protein-calorie malnutrition: Secondary | ICD-10-CM | POA: Diagnosis not present

## 2023-06-24 DIAGNOSIS — S81812S Laceration without foreign body, left lower leg, sequela: Secondary | ICD-10-CM | POA: Diagnosis not present

## 2023-06-24 DIAGNOSIS — S51812D Laceration without foreign body of left forearm, subsequent encounter: Secondary | ICD-10-CM | POA: Diagnosis not present

## 2023-06-26 ENCOUNTER — Ambulatory Visit: Payer: Medicare HMO

## 2023-06-27 DIAGNOSIS — C3431 Malignant neoplasm of lower lobe, right bronchus or lung: Secondary | ICD-10-CM | POA: Diagnosis not present

## 2023-06-27 DIAGNOSIS — S81812S Laceration without foreign body, left lower leg, sequela: Secondary | ICD-10-CM | POA: Diagnosis not present

## 2023-06-27 DIAGNOSIS — R531 Weakness: Secondary | ICD-10-CM | POA: Diagnosis not present

## 2023-06-27 DIAGNOSIS — S51812D Laceration without foreign body of left forearm, subsequent encounter: Secondary | ICD-10-CM | POA: Diagnosis not present

## 2023-06-27 DIAGNOSIS — C7931 Secondary malignant neoplasm of brain: Secondary | ICD-10-CM | POA: Diagnosis not present

## 2023-06-27 DIAGNOSIS — R918 Other nonspecific abnormal finding of lung field: Secondary | ICD-10-CM | POA: Diagnosis not present

## 2023-06-27 DIAGNOSIS — G9389 Other specified disorders of brain: Secondary | ICD-10-CM | POA: Diagnosis not present

## 2023-06-27 DIAGNOSIS — I10 Essential (primary) hypertension: Secondary | ICD-10-CM | POA: Diagnosis not present

## 2023-06-27 DIAGNOSIS — G936 Cerebral edema: Secondary | ICD-10-CM | POA: Diagnosis not present

## 2023-06-27 DIAGNOSIS — M6281 Muscle weakness (generalized): Secondary | ICD-10-CM | POA: Diagnosis not present

## 2023-06-27 DIAGNOSIS — R519 Headache, unspecified: Secondary | ICD-10-CM | POA: Diagnosis not present

## 2023-06-27 DIAGNOSIS — M1990 Unspecified osteoarthritis, unspecified site: Secondary | ICD-10-CM | POA: Diagnosis not present

## 2023-06-27 DIAGNOSIS — Z1152 Encounter for screening for COVID-19: Secondary | ICD-10-CM | POA: Diagnosis not present

## 2023-06-27 DIAGNOSIS — R279 Unspecified lack of coordination: Secondary | ICD-10-CM | POA: Diagnosis not present

## 2023-06-27 DIAGNOSIS — F419 Anxiety disorder, unspecified: Secondary | ICD-10-CM | POA: Diagnosis not present

## 2023-06-27 DIAGNOSIS — R5381 Other malaise: Secondary | ICD-10-CM | POA: Diagnosis not present

## 2023-06-27 DIAGNOSIS — G939 Disorder of brain, unspecified: Secondary | ICD-10-CM | POA: Diagnosis not present

## 2023-06-27 DIAGNOSIS — G2581 Restless legs syndrome: Secondary | ICD-10-CM | POA: Diagnosis not present

## 2023-06-27 DIAGNOSIS — M542 Cervicalgia: Secondary | ICD-10-CM | POA: Diagnosis not present

## 2023-06-27 DIAGNOSIS — R54 Age-related physical debility: Secondary | ICD-10-CM | POA: Diagnosis not present

## 2023-06-28 DIAGNOSIS — G939 Disorder of brain, unspecified: Secondary | ICD-10-CM | POA: Diagnosis not present

## 2023-06-28 DIAGNOSIS — G9389 Other specified disorders of brain: Secondary | ICD-10-CM | POA: Diagnosis not present

## 2023-06-30 ENCOUNTER — Ambulatory Visit: Payer: Medicare HMO

## 2023-09-04 DIAGNOSIS — R6889 Other general symptoms and signs: Secondary | ICD-10-CM | POA: Diagnosis not present

## 2023-09-04 DIAGNOSIS — Z743 Need for continuous supervision: Secondary | ICD-10-CM | POA: Diagnosis not present

## 2023-09-09 DIAGNOSIS — R5381 Other malaise: Secondary | ICD-10-CM | POA: Diagnosis not present

## 2023-09-09 DIAGNOSIS — Z789 Other specified health status: Secondary | ICD-10-CM | POA: Diagnosis not present

## 2023-09-09 DIAGNOSIS — K219 Gastro-esophageal reflux disease without esophagitis: Secondary | ICD-10-CM | POA: Diagnosis not present

## 2023-09-12 DIAGNOSIS — F4321 Adjustment disorder with depressed mood: Secondary | ICD-10-CM | POA: Diagnosis not present

## 2023-09-15 DIAGNOSIS — R058 Other specified cough: Secondary | ICD-10-CM | POA: Diagnosis not present

## 2023-09-16 DIAGNOSIS — K219 Gastro-esophageal reflux disease without esophagitis: Secondary | ICD-10-CM | POA: Diagnosis not present

## 2023-09-16 DIAGNOSIS — Z789 Other specified health status: Secondary | ICD-10-CM | POA: Diagnosis not present

## 2023-09-16 DIAGNOSIS — R5381 Other malaise: Secondary | ICD-10-CM | POA: Diagnosis not present

## 2023-09-16 DIAGNOSIS — R058 Other specified cough: Secondary | ICD-10-CM | POA: Diagnosis not present

## 2023-10-10 DEATH — deceased
